# Patient Record
Sex: Male | Born: 1940
Health system: Southern US, Community
[De-identification: ages and names within clinical notes are randomized; demographics above are authoritative.]

## PROBLEM LIST (undated history)

## (undated) DIAGNOSIS — N400 Enlarged prostate without lower urinary tract symptoms: Secondary | ICD-10-CM

## (undated) DIAGNOSIS — N138 Other obstructive and reflux uropathy: Secondary | ICD-10-CM

## (undated) DIAGNOSIS — M1990 Unspecified osteoarthritis, unspecified site: Secondary | ICD-10-CM

## (undated) DIAGNOSIS — N401 Enlarged prostate with lower urinary tract symptoms: Secondary | ICD-10-CM

## (undated) DIAGNOSIS — D696 Thrombocytopenia, unspecified: Secondary | ICD-10-CM

## (undated) DIAGNOSIS — D649 Anemia, unspecified: Secondary | ICD-10-CM

## (undated) DIAGNOSIS — E785 Hyperlipidemia, unspecified: Secondary | ICD-10-CM

## (undated) DIAGNOSIS — I639 Cerebral infarction, unspecified: Secondary | ICD-10-CM

## (undated) DIAGNOSIS — R351 Nocturia: Secondary | ICD-10-CM

## (undated) DIAGNOSIS — N189 Chronic kidney disease, unspecified: Secondary | ICD-10-CM

## (undated) DIAGNOSIS — N485 Ulcer of penis: Secondary | ICD-10-CM

## (undated) DIAGNOSIS — C4490 Unspecified malignant neoplasm of skin, unspecified: Secondary | ICD-10-CM

## (undated) DIAGNOSIS — I509 Heart failure, unspecified: Secondary | ICD-10-CM

## (undated) DIAGNOSIS — K219 Gastro-esophageal reflux disease without esophagitis: Secondary | ICD-10-CM

## (undated) DIAGNOSIS — IMO0002 Reserved for concepts with insufficient information to code with codable children: Secondary | ICD-10-CM

## (undated) DIAGNOSIS — D126 Benign neoplasm of colon, unspecified: Secondary | ICD-10-CM

## (undated) HISTORY — DX: Benign prostatic hyperplasia without lower urinary tract symptoms: N40.0

## (undated) HISTORY — DX: Nocturia: R35.1

## (undated) HISTORY — DX: Reserved for concepts with insufficient information to code with codable children: IMO0002

## (undated) HISTORY — DX: Gastro-esophageal reflux disease without esophagitis: K21.9

## (undated) HISTORY — DX: Anemia, unspecified: D64.9

## (undated) HISTORY — DX: Cerebral infarction, unspecified: I63.9

## (undated) HISTORY — DX: Benign prostatic hyperplasia with lower urinary tract symptoms: N40.1

## (undated) HISTORY — DX: Unspecified malignant neoplasm of skin, unspecified: C44.90

## (undated) HISTORY — PX: HEMORRHOID SURGERY: SHX153

## (undated) HISTORY — DX: Heart failure, unspecified: I50.9

## (undated) HISTORY — DX: Thrombocytopenia, unspecified: D69.6

## (undated) HISTORY — DX: Benign neoplasm of colon, unspecified: D12.6

## (undated) HISTORY — DX: Other obstructive and reflux uropathy: N13.8

## (undated) HISTORY — DX: Hyperlipidemia, unspecified: E78.5

## (undated) HISTORY — DX: Chronic kidney disease, unspecified: N18.9

## (undated) HISTORY — PX: EYE SURGERY: SHX253

## (undated) HISTORY — DX: Ulcer of penis: N48.5

---

## 2007-12-30 ENCOUNTER — Ambulatory Visit: Payer: Self-pay | Admitting: Gastroenterology

## 2009-02-08 ENCOUNTER — Ambulatory Visit: Payer: Self-pay | Admitting: Unknown Physician Specialty

## 2013-02-25 ENCOUNTER — Ambulatory Visit: Payer: Self-pay | Admitting: Urology

## 2013-02-25 LAB — CBC WITH DIFFERENTIAL/PLATELET
Eosinophil %: 1.6 %
HCT: 43.6 % (ref 40.0–52.0)
HGB: 14.7 g/dL (ref 13.0–18.0)
Lymphocyte #: 0.7 10*3/uL — ABNORMAL LOW (ref 1.0–3.6)
Lymphocyte %: 14.2 %
MCH: 32.6 pg (ref 26.0–34.0)
MCHC: 33.8 g/dL (ref 32.0–36.0)
MCV: 97 fL (ref 80–100)
Monocyte %: 10.5 %
Neutrophil #: 3.8 10*3/uL (ref 1.4–6.5)
Neutrophil %: 73.4 %
Platelet: 116 10*3/uL — ABNORMAL LOW (ref 150–440)
RDW: 12.5 % (ref 11.5–14.5)
WBC: 5.2 10*3/uL (ref 3.8–10.6)

## 2013-03-09 ENCOUNTER — Ambulatory Visit: Payer: Self-pay | Admitting: Urology

## 2013-03-09 LAB — ELECTROLYTE PANEL
Anion Gap: 6 — ABNORMAL LOW (ref 7–16)
Chloride: 107 mmol/L (ref 98–107)
Co2: 28 mmol/L (ref 21–32)
Potassium: 4.2 mmol/L (ref 3.5–5.1)

## 2013-03-10 LAB — CBC WITH DIFFERENTIAL/PLATELET
Basophil #: 0 10*3/uL (ref 0.0–0.1)
Eosinophil #: 0.1 10*3/uL (ref 0.0–0.7)
Eosinophil %: 1.9 %
HCT: 39.5 % — ABNORMAL LOW (ref 40.0–52.0)
HGB: 13.3 g/dL (ref 13.0–18.0)
Lymphocyte #: 0.5 10*3/uL — ABNORMAL LOW (ref 1.0–3.6)
MCHC: 33.7 g/dL (ref 32.0–36.0)
MCV: 97 fL (ref 80–100)
Monocyte #: 0.6 x10 3/mm (ref 0.2–1.0)
Monocyte %: 11.1 %
Neutrophil #: 4.3 10*3/uL (ref 1.4–6.5)
RBC: 4.1 10*6/uL — ABNORMAL LOW (ref 4.40–5.90)

## 2013-03-10 LAB — ELECTROLYTE PANEL
Anion Gap: 2 — ABNORMAL LOW (ref 7–16)
Chloride: 108 mmol/L — ABNORMAL HIGH (ref 98–107)
Co2: 31 mmol/L (ref 21–32)
Potassium: 4.1 mmol/L (ref 3.5–5.1)
Sodium: 141 mmol/L (ref 136–145)

## 2013-03-11 LAB — PATHOLOGY REPORT

## 2014-04-05 ENCOUNTER — Ambulatory Visit: Payer: Self-pay | Admitting: Unknown Physician Specialty

## 2014-04-06 LAB — PATHOLOGY REPORT

## 2015-03-18 NOTE — Consult Note (Signed)
PATIENT NAME:  Gabriel Martin, Gabriel Martin MR#:  [id number] DATE OF BIRTH:  01-07-1941  DATE OF CONSULTATION:  03/10/2013  REFERRING PHYSICIAN:   CONSULTING PHYSICIAN:  Janice Coffin. Elnoria Howard, DO  SUMMARY: First postop day. The patient is on observation status. He has no complaints this morning. He has had a restful night. He is hungry this morning; we are going to start him on his regular diet today. He has a light pink urine and irrigation flow in his tubing. There is no active bleeding and the drip is very, very slow. We are going to turn the drip off today for the constant bladder irrigation and see if he clears up. If he has no bleeding, we will remove his Foley and try to get him home today. If it is all red, will send him home with the catheter as long as he has no clots.   Hemoglobin is 13.3 today, which is very stable post TURP. Hematocrit is normal. Electrolytes are normal.   PHYSICAL EXAMINATION: Abdomen is soft, no distress. Catheter is working well. Penis is in good condition with no problem. There is no paraphimosis.   The patient will be maintained on his irrigation. I have explained to him what we did in the procedure. Right now, his condition continues to be satisfactory. I will see how he is at noon. If he is doing well, will send him on his way.    ____________________________ Janice Coffin. Elnoria Howard, DO rdh:es D: 03/10/2013 07:53:04 ET T: 03/10/2013 08:09:21 ET JOB#: [id number]  cc: Janice Coffin. Elnoria Howard, DO, <Dictator>   RICHARD D HART DO ELECTRONICALLY SIGNED 03/12/2013 15:16

## 2015-03-18 NOTE — Op Note (Signed)
PATIENT NAME:  KVION, SHAPLEY MR#:  [id number] DATE OF BIRTH:  Feb 27, 1941  DATE OF PROCEDURE:  03/09/2013  PREOPERATIVE DIAGNOSIS: Vesicle outlet obstruction secondary to benign prostatic hypertrophy.   POSTOPERATIVE DIAGNOSIS: Vesicle outlet obstruction secondary to benign prostatic hypertrophy, mostly middle lobe.   PROCEDURE: Transurethral resection of the prostate.   COMPLICATIONS: None.   ESTIMATED BLOOD LOSS: 20 mL.   ANESTHESIA: LMA general.   PROCEDURE AND FINDINGS ARE AS FOLLOWS: With the patient sterilely prepped and draped and in the supine lithotomy position for ease of approach to the external genitalia, I began the procedure. I used a saline Olympus resectoscope for the procedure. That allows me to put the resectoscope in under direct vision. I removed the obturator and placed the working elements within the 24-French sheath. Constant irrigation and flow was utilized. I am able to see the patient has a massive middle lobe. It is ball-valving into the bladder. It is difficult even to see the ureters, but I only resected the large prostatic middle lobe mass. There are relatively no lateral lobes. He had almost a complete obstruction of his bladder every time he voided. I resected out this middle lobe keeping the verumontanum at the very far point of resection. I do not even go near the vero peritoneum. So, the middle lobe was resected out. There is no lateral lobe hypertrophy. Bleeding is controlled with the electrocautery. Normal saline is used for irrigation. At the end of the procedure, I attempt to use a 2-way Foley, but he has a little oozing so I have to put a 3-way in with irrigation and keep him in observation overnight to control any oozing from his fossa. It is a good-sized fossa and I resected deeply into it but got into no venous sinus bleeding at all. So, he has an estimated blood loss of 20 mL. No real complications. He was sent to recovery in satisfactory condition with  a 3-way Foley draining almost clear urine or light pink. Can still see through it.   ____________________________ Janice Coffin. Elnoria Howard, DO rdh:gb D: 03/09/2013 16:39:01 ET T: 03/10/2013 04:38:08 ET JOB#: [id number]  cc: Janice Coffin. Elnoria Howard, DO, <Dictator> RICHARD D HART DO ELECTRONICALLY SIGNED 03/12/2013 15:16

## 2015-03-18 NOTE — Discharge Summary (Signed)
PATIENT NAME:  Gabriel Martin, Gabriel Martin MR#:  [id number] DATE OF BIRTH:  [birth year]  DATE OF ADMISSION:  03/09/2013  DATE OF DISCHARGE:  03/10/2013  DISCHARGE DIAGNOSES:  Vesical outlet obstruction secondary to benign prostatic hypertrophy.   CONDITION ON DISCHARGE:  Condition is satisfactory.   DISPOSITION:  Discharged to home.  SPECIAL ORDERS:  The patient will be discharged a Foley catheter and Bactrim DS for antibiotic x 5 days. He will he will follow-up in 2 days in my office on Thursday at which time I will fill his bladder through his catheter and remove his catheter and see if he can void. He is sent home with Foley catheter and bedside bag. He is given instructions to care for this. His condition  today is that he light pink urine. Hemoglobin is stable. He has no clots after irrigation turned off. He is ambulatory and eating well. No nausea or vomiting. No leg pain, no shortness of breath. He is then discharged in satisfactory condition. Await his path report.  His follow-up visit is in 2 weeks and then one month after we remove the catheter.  His condition is satisfactory.     ____________________________ Janice Coffin. Elnoria Howard, [place] rdh:ct D: 03/10/2013 12:17:19 ET T: 03/10/2013 12:22:51 ET JOB#: [id number]  cc:   Delfino Lovett D Ragan Reale DO ELECTRONICALLY SIGNED 03/12/2013 15:16

## 2015-05-12 ENCOUNTER — Ambulatory Visit (INDEPENDENT_AMBULATORY_CARE_PROVIDER_SITE_OTHER): Payer: Medicare Other | Admitting: Urology

## 2015-05-12 ENCOUNTER — Encounter: Payer: Self-pay | Admitting: *Deleted

## 2015-05-12 VITALS — BP 137/85 | HR 67 | Ht 66.0 in | Wt 210.2 lb

## 2015-05-12 DIAGNOSIS — N485 Ulcer of penis: Secondary | ICD-10-CM | POA: Insufficient documentation

## 2015-05-12 DIAGNOSIS — N138 Other obstructive and reflux uropathy: Secondary | ICD-10-CM

## 2015-05-12 DIAGNOSIS — IMO0002 Reserved for concepts with insufficient information to code with codable children: Secondary | ICD-10-CM

## 2015-05-12 DIAGNOSIS — N401 Enlarged prostate with lower urinary tract symptoms: Secondary | ICD-10-CM | POA: Diagnosis not present

## 2015-05-12 DIAGNOSIS — E785 Hyperlipidemia, unspecified: Secondary | ICD-10-CM | POA: Insufficient documentation

## 2015-05-12 DIAGNOSIS — N189 Chronic kidney disease, unspecified: Secondary | ICD-10-CM | POA: Insufficient documentation

## 2015-05-12 DIAGNOSIS — K219 Gastro-esophageal reflux disease without esophagitis: Secondary | ICD-10-CM | POA: Insufficient documentation

## 2015-05-12 DIAGNOSIS — D696 Thrombocytopenia, unspecified: Secondary | ICD-10-CM | POA: Insufficient documentation

## 2015-05-12 HISTORY — DX: Other obstructive and reflux uropathy: N13.8

## 2015-05-12 HISTORY — DX: Ulcer of penis: N48.5

## 2015-05-12 HISTORY — DX: Hyperlipidemia, unspecified: E78.5

## 2015-05-12 HISTORY — DX: Reserved for concepts with insufficient information to code with codable children: IMO0002

## 2015-05-12 HISTORY — DX: Gastro-esophageal reflux disease without esophagitis: K21.9

## 2015-05-12 HISTORY — DX: Chronic kidney disease, unspecified: N18.9

## 2015-05-12 LAB — BLADDER SCAN AMB NON-IMAGING: SCAN RESULT: 41

## 2015-05-12 MED ORDER — NYSTATIN 100000 UNIT/GM EX CREA: 1.0000 "application " | TOPICAL_CREAM | Freq: Two times a day (BID) | CUTANEOUS | Status: DC

## 2015-05-12 NOTE — Progress Notes (Signed)
05/12/2015 10:01 AM   Gabriel Martin 25-Oct-1941 [id number]  Referring provider: No referring provider defined for this encounter.  Chief Complaint  Patient presents with  . Benign Prostatic Hypertrophy    one year follow up    HPI: Gabriel Martin is a 74 y/o white male with BPH with LUTS who is s/p TURP on 03/09/2013 with Dr. Elnoria Howard for BOO.  He has been pleased with his voiding since the TURP.  His IPSS score today is 7/2 (mild).  His PVR is 41 mL.  His current lower urinary tract symptoms are feelings of incomplete emptying, urgency, weak stream and nocturia x3.  His is mostly satisfied with his urinary symptoms.  His PSA one year ago was 1.2 ng/mL.    He has not had any recent fevers, chills, nausea, vomiting, suprapubic pain, UTI's or gross hematuria.    On today's exam, there was an incidental finding of a penile ulcer.  The patient did not know how long the lesion had been there.  He has not had any burning or stinging of the ulcer.  He has not had any dysuria or penile discharge.        IPSS      05/12/15 0800       International Prostate Symptom Score   How often have you had the sensation of not emptying your bladder? Less than half the time     How often have you had to urinate less than every two hours? Not at All     How often have you found you stopped and started again several times when you urinated? Not at All     How often have you found it difficult to postpone urination? Less than 1 in 5 times     How often have you had a weak urinary stream? Less than 1 in 5 times     How often have you had to strain to start urination? Not at All     How many times did you typically get up at night to urinate? 3 Times     Total IPSS Score 7     Quality of Life due to urinary symptoms   If you were to spend the rest of your life with your urinary condition just the way it is now how would you feel about that? Mostly Satisfied         PMH: Past Medical History    Diagnosis Date  . Skin cancer   . Benign neoplasm of large bowel   . Anemia   . Hyperlipidemia   . Thrombocytopenia   . GERD (gastroesophageal reflux disease)   . Benign enlargement of prostate   . BPH (benign prostatic hyperplasia)   . Nocturia associated with benign prostatic hypertrophy     Surgical History: Past Surgical History  Procedure Laterality Date  . Hemorrhoid surgery      Home Medications:    Medication List       This list is accurate as of: 05/12/15 10:01 AM.  Always use your most recent med list.               lovastatin 20 MG tablet  Commonly known as:  MEVACOR  Take 20 mg by mouth at bedtime.     nystatin cream  Commonly known as:  MYCOSTATIN  Apply 1 application topically 2 (two) times daily.     ranitidine 75 MG tablet  Commonly known as:  ZANTAC  Take 75 mg by  mouth 2 (two) times daily.        Allergies: No Known Allergies  Family History: Family History  Problem Relation Age of Onset  . Heart disease Brother     Social History:  reports that he has quit smoking. He does not have any smokeless tobacco history on file. He reports that he drinks about 6.0 oz of alcohol per week. He reports that he does not use illicit drugs.  ROS: Urological Symptom Review  Patient is experiencing the following symptoms: Hard to postpone urination Get up at night to urinate Weak stream   Review of Systems  Gastrointestinal (upper)  : Negative for upper GI symptoms  Gastrointestinal (lower) : Negative for lower GI symptoms  Constitutional : Negative for symptoms  Skin: Negative for skin symptoms  Eyes: Negative for eye symptoms  Ear/Nose/Throat : Negative for Ear/Nose/Throat symptoms  Hematologic/Lymphatic: Negative for Hematologic/Lymphatic symptoms  Cardiovascular : Negative for cardiovascular symptoms  Respiratory : Negative for respiratory symptoms  Endocrine: Negative for endocrine  symptoms  Musculoskeletal: Negative for musculoskeletal symptoms  Neurological: Negative for neurological symptoms  Psychologic: Negative for psychiatric symptoms   Physical Exam: BP 137/85 mmHg  Pulse 67  Ht 5\' 6"  (1.676 m)  Wt 210 lb 3.2 oz (95.346 kg)  BMI 33.94 kg/m2  GU: Patient with uncircumcised phallus. Foreskin easily retracted  Urethral meatus is patent.  No penile discharge. Ulcerative lesion in the foreskin and head of the glands (balanitis vs penile carcinoma) on the left dorsal side.  The ulcers are present where the foreskin and the glands meet.  There is a clearing between the ulcers.  A yeasty discharge in around the coronal surface.   Scrotum without lesions, cysts, rashes and/or edema.  Testicles are located scrotally bilaterally. No masses are appreciated in the testicles. Left and right epididymis are normal. Rectal: Patient with  normal sphincter tone. Perineum without scarring or rashes. No rectal masses are appreciated. Prostate is approximately 55 grams, no nodules are appreciated. Seminal vesicles are normal.   Laboratory Data:  Lab Results  Component Value Date   WBC 5.6 03/10/2013   HGB 13.3 03/10/2013   HCT 39.5* 03/10/2013   MCV 97 03/10/2013   PLT 70* 03/10/2013    No results found for: CREATININE  No results found for: PSA  No results found for: TESTOSTERONE  No results found for: HGBA1C  Urinalysis No results found for: COLORURINE, APPEARANCEUR, [place], [place], GLUCOSEU, [place], [place], [place], NITRITE, LEUKOCYTESUR  Pertinent Imaging: Results for TAICHI, REPKA (MRN [id number]) as of 05/12/2015 09:09  Ref. Range 05/12/2015 08:41  Scan Result Unknown 41    Assessment & Plan:    1. BPH (benign prostatic hyperplasia) with LUTS-  Patient has been pleased with his urination since his TURP in 2014.  His IPPS is 7/2 and his PVR is 41 mL. Last PSA was 1.2 ng/mL on 05/01/2014. His symptoms remain mild,  therefore he will return on an annual basis. At his annual visit we'll obtain IPSS, DRE and PSA.  - PSA - BLADDER SCAN AMB NON-IMAGING  2. Penile Ulcer- Patient had an incidental finding of a penile ulcer. Patient is unaware of how long the ulcer had been present. It is not painful to the patient. There was also a collection of a yeasty discharge around the coronal surface of the penis when the foreskin was  retracted.  I have prescribed nystatin cream and instructed the patient pull back the foreskin and clean the head of  the penis with soapy water and dry completely before applying the nystatin cream. He is to do this twice a day for 2 weeks. I would have liked him to return in 2 weeks, so that I can examine the area myself, but the patient did not want to follow-up unless the ulcer did not respond to the medication. I explained to the patient that he needed to be vigilant about this area, if it did not improve, it is very important  he return for an appointment with [place]. At that appointment, we will need to discuss the biopsy of the lesion to rule out any penile cancer. Patient understands this and will make an appointment with [place] if the ulcer does not clear.   No Follow-up on file.  Zara Council, [facility] [address] [place], [place] [address] [phone]

## 2015-05-13 ENCOUNTER — Telehealth: Payer: Self-pay

## 2015-05-13 LAB — PSA: PROSTATE SPECIFIC AG, SERUM: 1.2 ng/mL (ref 0.0–4.0)

## 2015-05-13 NOTE — Telephone Encounter (Signed)
LMOM

## 2015-05-13 NOTE — Telephone Encounter (Signed)
-----   Message from Nori Riis, PA-C sent at 05/13/2015  8:42 AM EDT ----- PSA is stable.  We will see him in one year.  Sooner, if the ulcer on his penis does not clear.

## 2015-05-16 ENCOUNTER — Telehealth: Payer: Self-pay

## 2015-05-16 NOTE — Telephone Encounter (Signed)
LMOM in reference to PSA.

## 2015-05-16 NOTE — Telephone Encounter (Signed)
-----   Message from Nori Riis, PA-C sent at 05/13/2015  8:42 AM EDT ----- PSA is stable.  We will see him in one year.  Sooner, if the ulcer on his penis does not clear.

## 2015-12-08 DIAGNOSIS — D696 Thrombocytopenia, unspecified: Secondary | ICD-10-CM | POA: Diagnosis not present

## 2015-12-08 DIAGNOSIS — K219 Gastro-esophageal reflux disease without esophagitis: Secondary | ICD-10-CM | POA: Diagnosis not present

## 2015-12-08 DIAGNOSIS — E7800 Pure hypercholesterolemia, unspecified: Secondary | ICD-10-CM | POA: Diagnosis not present

## 2015-12-22 DIAGNOSIS — D696 Thrombocytopenia, unspecified: Secondary | ICD-10-CM | POA: Diagnosis not present

## 2015-12-22 DIAGNOSIS — K219 Gastro-esophageal reflux disease without esophagitis: Secondary | ICD-10-CM | POA: Diagnosis not present

## 2015-12-22 DIAGNOSIS — E7800 Pure hypercholesterolemia, unspecified: Secondary | ICD-10-CM | POA: Diagnosis not present

## 2015-12-22 DIAGNOSIS — N182 Chronic kidney disease, stage 2 (mild): Secondary | ICD-10-CM | POA: Diagnosis not present

## 2016-01-10 DIAGNOSIS — H2513 Age-related nuclear cataract, bilateral: Secondary | ICD-10-CM | POA: Diagnosis not present

## 2016-01-31 DIAGNOSIS — H2513 Age-related nuclear cataract, bilateral: Secondary | ICD-10-CM | POA: Diagnosis not present

## 2016-02-23 DIAGNOSIS — H2513 Age-related nuclear cataract, bilateral: Secondary | ICD-10-CM | POA: Diagnosis not present

## 2016-02-28 NOTE — Discharge Instructions (Signed)

## 2016-02-29 ENCOUNTER — Encounter
Admission: RE | Disposition: A | Discharge status: Home / Self Care | Payer: Self-pay | Source: Ambulatory Visit | Attending: Ophthalmology

## 2016-02-29 ENCOUNTER — Ambulatory Visit: Payer: PPO | Admitting: Anesthesiology

## 2016-02-29 ENCOUNTER — Ambulatory Visit
Admission: RE | Admit: 2016-02-29 | Discharge: 2016-02-29 | Disposition: A | Discharge status: Home / Self Care | Payer: PPO | Source: Ambulatory Visit | Attending: Ophthalmology | Admitting: Ophthalmology

## 2016-02-29 DIAGNOSIS — K219 Gastro-esophageal reflux disease without esophagitis: Secondary | ICD-10-CM | POA: Insufficient documentation

## 2016-02-29 DIAGNOSIS — H2512 Age-related nuclear cataract, left eye: Secondary | ICD-10-CM | POA: Insufficient documentation

## 2016-02-29 DIAGNOSIS — Z87891 Personal history of nicotine dependence: Secondary | ICD-10-CM | POA: Diagnosis not present

## 2016-02-29 DIAGNOSIS — E7800 Pure hypercholesterolemia, unspecified: Secondary | ICD-10-CM | POA: Insufficient documentation

## 2016-02-29 DIAGNOSIS — D696 Thrombocytopenia, unspecified: Secondary | ICD-10-CM | POA: Diagnosis not present

## 2016-02-29 DIAGNOSIS — H2513 Age-related nuclear cataract, bilateral: Secondary | ICD-10-CM | POA: Diagnosis not present

## 2016-02-29 HISTORY — DX: Unspecified osteoarthritis, unspecified site: M19.90

## 2016-02-29 HISTORY — PX: CATARACT EXTRACTION W/PHACO: SHX586

## 2016-02-29 SURGERY — PHACOEMULSIFICATION, CATARACT, WITH IOL INSERTION
Anesthesia: Monitor Anesthesia Care | Site: Eye | Laterality: Left | Wound class: Clean

## 2016-02-29 MED ORDER — OXYCODONE HCL 5 MG/5ML PO SOLN: 5.0000 mg | Freq: Once | ORAL | Status: DC | PRN

## 2016-02-29 MED ORDER — LIDOCAINE HCL (PF) 4 % IJ SOLN
INTRAMUSCULAR | Status: DC | PRN
  Administered 2016-02-29: 1 mL via OPHTHALMIC

## 2016-02-29 MED ORDER — FENTANYL CITRATE (PF) 100 MCG/2ML IJ SOLN
INTRAMUSCULAR | Status: DC | PRN
  Administered 2016-02-29: 50 ug via INTRAVENOUS

## 2016-02-29 MED ORDER — BRIMONIDINE TARTRATE 0.2 % OP SOLN
OPHTHALMIC | Status: DC | PRN
  Administered 2016-02-29: 1 [drp] via OPHTHALMIC

## 2016-02-29 MED ORDER — MIDAZOLAM HCL 2 MG/2ML IJ SOLN
INTRAMUSCULAR | Status: DC | PRN
  Administered 2016-02-29 (×2): 1 mg via INTRAVENOUS

## 2016-02-29 MED ORDER — OXYCODONE HCL 5 MG PO TABS: 5.0000 mg | ORAL_TABLET | Freq: Once | ORAL | Status: DC | PRN

## 2016-02-29 MED ORDER — POVIDONE-IODINE 5 % OP SOLN
1.0000 "application " | OPHTHALMIC | Status: DC | PRN
  Administered 2016-02-29: 1 via OPHTHALMIC

## 2016-02-29 MED ORDER — NA HYALUR & NA CHOND-NA HYALUR 0.4-0.35 ML IO KIT
PACK | INTRAOCULAR | Status: DC | PRN
  Administered 2016-02-29: 1 mL via INTRAOCULAR

## 2016-02-29 MED ORDER — ARMC OPHTHALMIC DILATING GEL
1.0000 "application " | OPHTHALMIC | Status: DC | PRN
  Administered 2016-02-29 (×2): 1 via OPHTHALMIC

## 2016-02-29 MED ORDER — EPINEPHRINE HCL 1 MG/ML IJ SOLN
INTRAOCULAR | Status: DC | PRN
  Administered 2016-02-29: 47 mL via OPHTHALMIC

## 2016-02-29 MED ORDER — TETRACAINE HCL 0.5 % OP SOLN
1.0000 [drp] | OPHTHALMIC | Status: DC | PRN
  Administered 2016-02-29: 1 [drp] via OPHTHALMIC

## 2016-02-29 SURGICAL SUPPLY — 26 items
CANNULA ANT/CHMB 27GA (MISCELLANEOUS) ×3 IMPLANT
CARTRIDGE ABBOTT (MISCELLANEOUS) ×3 IMPLANT
GLOVE SURG LX 7.5 STRW (GLOVE) ×2
GLOVE SURG LX STRL 7.5 STRW (GLOVE) ×1 IMPLANT
GLOVE SURG TRIUMPH 8.0 PF LTX (GLOVE) ×3 IMPLANT
GOWN STRL REUS W/ TWL LRG LVL3 (GOWN DISPOSABLE) ×2 IMPLANT
GOWN STRL REUS W/TWL LRG LVL3 (GOWN DISPOSABLE) ×4
LENS IOL TECNIS ITEC 21.5 (Intraocular Lens) ×3 IMPLANT
MARKER SKIN DUAL TIP RULER LAB (MISCELLANEOUS) ×3 IMPLANT
NDL RETROBULBAR .5 NSTRL (NEEDLE) IMPLANT
NEEDLE FILTER BLUNT 18X 1/2SAF (NEEDLE) ×2
NEEDLE FILTER BLUNT 18X1 1/2 (NEEDLE) ×1 IMPLANT
PACK CATARACT BRASINGTON (MISCELLANEOUS) ×3 IMPLANT
PACK EYE AFTER SURG (MISCELLANEOUS) ×3 IMPLANT
PACK OPTHALMIC (MISCELLANEOUS) ×3 IMPLANT
RING MALYGIN 7.0 (MISCELLANEOUS) IMPLANT
SUT ETHILON 10-0 CS-B-6CS-B-6 (SUTURE)
SUT VICRYL  9 0 (SUTURE)
SUT VICRYL 9 0 (SUTURE) IMPLANT
SUTURE EHLN 10-0 CS-B-6CS-B-6 (SUTURE) IMPLANT
SYR 3ML LL SCALE MARK (SYRINGE) ×3 IMPLANT
SYR 5ML LL (SYRINGE) IMPLANT
SYR TB 1ML LUER SLIP (SYRINGE) ×3 IMPLANT
WATER STERILE IRR 250ML POUR (IV SOLUTION) ×3 IMPLANT
WATER STERILE IRR 500ML POUR (IV SOLUTION) IMPLANT
WIPE NON LINTING 3.25X3.25 (MISCELLANEOUS) ×3 IMPLANT

## 2016-02-29 NOTE — Op Note (Signed)
OPERATIVE NOTE  WALLICE FRYMIER [id number] 02/29/2016   PREOPERATIVE DIAGNOSIS:  Nuclear sclerotic cataract left eye. H25.12   POSTOPERATIVE DIAGNOSIS:    Nuclear sclerotic cataract left eye.     PROCEDURE:  Phacoemusification with posterior chamber intraocular lens placement of the left eye   LENS:   Implant Name Type Inv. Item Serial No. Manufacturer Lot No. LRB No. Used  TECHNIS ASPHERIC IOL PRE-LOADED       [facility]   Left 1     PCB00 21.5 D   ULTRASOUND TIME: 18  % of 0 minutes 57 seconds, CDE 10.4  SURGEON:  Wyonia Hough, MD   ANESTHESIA:  Topical with tetracaine drops and 2% Xylocaine jelly, augmented with 1% preservative-free intracameral lidocaine.    COMPLICATIONS:  None.   DESCRIPTION OF PROCEDURE:  The patient was identified in the holding room and transported to the operating room and placed in the supine position under the operating microscope.  The left eye was identified as the operative eye and it was prepped and draped in the usual sterile ophthalmic fashion.   A 1 millimeter clear-corneal paracentesis was made at the 1:30 position.  0.5 ml of preservative-free 1% lidocaine was injected into the anterior chamber.  The anterior chamber was filled with Viscoat viscoelastic.  A 2.4 millimeter keratome was used to make a near-clear corneal incision at the 10:30 position.  .  A curvilinear capsulorrhexis was made with a cystotome and capsulorrhexis forceps.  Balanced salt solution was used to hydrodissect and hydrodelineate the nucleus.   Phacoemulsification was then used in stop and chop fashion to remove the lens nucleus and epinucleus.  The remaining cortex was then removed using the irrigation and aspiration handpiece. Provisc was then placed into the capsular bag to distend it for lens placement.  A lens was then injected into the capsular bag.  The remaining viscoelastic was aspirated.   Wounds were hydrated with balanced salt solution.  The anterior  chamber was inflated to a physiologic pressure with balanced salt solution.  No wound leaks were noted. Cefuroxime 0.1 ml of a 10mg /ml solution was injected into the anterior chamber for a dose of 1 mg of intracameral antibiotic at the completion of the case.   Timolol and Brimonidine drops were applied to the eye.  The patient was taken to the recovery room in stable condition without complications of anesthesia or surgery.  Josslyn Ciolek 02/29/2016, 10:47 AM

## 2016-02-29 NOTE — Transfer of Care (Signed)
Immediate Anesthesia Transfer of Care Note  Patient: Gabriel Martin  Procedure(s) Performed: Procedure(s): CATARACT EXTRACTION PHACO AND INTRAOCULAR LENS PLACEMENT (IOC) left (Left)  Patient Location: PACU  Anesthesia Type: MAC  Level of Consciousness: awake, alert  and patient cooperative  Airway and Oxygen Therapy: Patient Spontanous Breathing and Patient connected to supplemental oxygen  Post-op Assessment: Post-op Vital signs reviewed, Patient's Cardiovascular Status Stable, Respiratory Function Stable, Patent Airway and No signs of Nausea or vomiting  Post-op Vital Signs: Reviewed and stable  Complications: No apparent anesthesia complications

## 2016-02-29 NOTE — Anesthesia Postprocedure Evaluation (Signed)
Anesthesia Post Note  Patient: Gabriel Martin  Procedure(s) Performed: Procedure(s) (LRB): CATARACT EXTRACTION PHACO AND INTRAOCULAR LENS PLACEMENT (IOC) left (Left)  Patient location during evaluation: PACU Anesthesia Type: MAC Level of consciousness: awake and alert Pain management: pain level controlled Vital Signs Assessment: post-procedure vital signs reviewed and stable Respiratory status: spontaneous breathing, nonlabored ventilation, respiratory function stable and patient connected to nasal cannula oxygen Cardiovascular status: stable and blood pressure returned to baseline Anesthetic complications: no    Karmella Bouvier C

## 2016-02-29 NOTE — H&P (Signed)
  The History and Physical notes are on paper, have been signed, and are to be scanned. The patient remains stable and unchanged from the H&P.   Previous H&P reviewed, patient examined, and there are no changes.  Gabriel Martin 02/29/2016 9:59 AM

## 2016-02-29 NOTE — Anesthesia Preprocedure Evaluation (Signed)
Anesthesia Evaluation  Patient identified by MRN, date of birth, ID band Patient awake    Reviewed: Allergy & Precautions, NPO status , Patient's Chart, lab work & pertinent test results  Airway Mallampati: II  TM Distance: >3 FB Neck ROM: Full    Dental no notable dental hx.    Pulmonary neg pulmonary ROS, former smoker,    Pulmonary exam normal breath sounds clear to auscultation       Cardiovascular negative cardio ROS Normal cardiovascular exam Rhythm:Regular Rate:Normal     Neuro/Psych negative neurological ROS  negative psych ROS   GI/Hepatic negative GI ROS, Neg liver ROS, GERD  Controlled,  Endo/Other  negative endocrine ROS  Renal/GU Renal disease  negative genitourinary   Musculoskeletal negative musculoskeletal ROS (+)   Abdominal   Peds negative pediatric ROS (+)  Hematology thrombocytopenia   Anesthesia Other Findings   Reproductive/Obstetrics negative OB ROS                             Anesthesia Physical Anesthesia Plan  ASA: II  Anesthesia Plan: MAC   Post-op Pain Management:    Induction: Intravenous  Airway Management Planned:   Additional Equipment:   Intra-op Plan:   Post-operative Plan: Extubation in OR  Informed Consent: I have reviewed the patients History and Physical, chart, labs and discussed the procedure including the risks, benefits and alternatives for the proposed anesthesia with the patient or authorized representative who has indicated his/her understanding and acceptance.   Dental advisory given  Plan Discussed with: CRNA  Anesthesia Plan Comments:         Anesthesia Quick Evaluation

## 2016-03-01 ENCOUNTER — Encounter: Payer: Self-pay | Admitting: Ophthalmology

## 2016-04-20 ENCOUNTER — Encounter: Payer: Self-pay | Admitting: *Deleted

## 2016-04-20 DIAGNOSIS — H2511 Age-related nuclear cataract, right eye: Secondary | ICD-10-CM | POA: Diagnosis not present

## 2016-04-24 NOTE — Discharge Instructions (Signed)

## 2016-04-25 ENCOUNTER — Ambulatory Visit: Payer: PPO | Admitting: Anesthesiology

## 2016-04-25 ENCOUNTER — Ambulatory Visit
Admission: RE | Admit: 2016-04-25 | Discharge: 2016-04-25 | Disposition: A | Discharge status: Home / Self Care | Payer: PPO | Source: Ambulatory Visit | Attending: Ophthalmology | Admitting: Ophthalmology

## 2016-04-25 ENCOUNTER — Encounter
Admission: RE | Disposition: A | Discharge status: Home / Self Care | Payer: Self-pay | Source: Ambulatory Visit | Attending: Ophthalmology

## 2016-04-25 ENCOUNTER — Ambulatory Visit: Admit: 2016-04-25 | Payer: Self-pay | Admitting: Ophthalmology

## 2016-04-25 DIAGNOSIS — Z87891 Personal history of nicotine dependence: Secondary | ICD-10-CM | POA: Diagnosis not present

## 2016-04-25 DIAGNOSIS — Z79899 Other long term (current) drug therapy: Secondary | ICD-10-CM | POA: Diagnosis not present

## 2016-04-25 DIAGNOSIS — Z9841 Cataract extraction status, right eye: Secondary | ICD-10-CM | POA: Diagnosis not present

## 2016-04-25 DIAGNOSIS — M1990 Unspecified osteoarthritis, unspecified site: Secondary | ICD-10-CM | POA: Insufficient documentation

## 2016-04-25 DIAGNOSIS — E7800 Pure hypercholesterolemia, unspecified: Secondary | ICD-10-CM | POA: Insufficient documentation

## 2016-04-25 DIAGNOSIS — K219 Gastro-esophageal reflux disease without esophagitis: Secondary | ICD-10-CM | POA: Insufficient documentation

## 2016-04-25 DIAGNOSIS — H269 Unspecified cataract: Secondary | ICD-10-CM | POA: Diagnosis not present

## 2016-04-25 DIAGNOSIS — H2511 Age-related nuclear cataract, right eye: Secondary | ICD-10-CM | POA: Insufficient documentation

## 2016-04-25 DIAGNOSIS — Z85828 Personal history of other malignant neoplasm of skin: Secondary | ICD-10-CM | POA: Diagnosis not present

## 2016-04-25 DIAGNOSIS — Z9889 Other specified postprocedural states: Secondary | ICD-10-CM | POA: Diagnosis not present

## 2016-04-25 HISTORY — PX: CATARACT EXTRACTION W/PHACO: SHX586

## 2016-04-25 SURGERY — PHACOEMULSIFICATION, CATARACT, WITH IOL INSERTION
Anesthesia: Monitor Anesthesia Care | Laterality: Right | Wound class: Clean

## 2016-04-25 SURGERY — PHACOEMULSIFICATION, CATARACT, WITH IOL INSERTION
Anesthesia: Choice | Laterality: Right

## 2016-04-25 MED ORDER — ARMC OPHTHALMIC DILATING GEL
1.0000 "application " | OPHTHALMIC | Status: DC | PRN
  Administered 2016-04-25 (×2): 1 via OPHTHALMIC

## 2016-04-25 MED ORDER — CEFUROXIME OPHTHALMIC INJECTION 1 MG/0.1 ML
INJECTION | OPHTHALMIC | Status: DC | PRN
  Administered 2016-04-25: 0.1 mL via OPHTHALMIC

## 2016-04-25 MED ORDER — POVIDONE-IODINE 5 % OP SOLN
1.0000 "application " | OPHTHALMIC | Status: DC | PRN
  Administered 2016-04-25: 1 via OPHTHALMIC

## 2016-04-25 MED ORDER — MIDAZOLAM HCL 2 MG/2ML IJ SOLN
INTRAMUSCULAR | Status: DC | PRN
  Administered 2016-04-25: 1 mg via INTRAVENOUS

## 2016-04-25 MED ORDER — NA HYALUR & NA CHOND-NA HYALUR 0.4-0.35 ML IO KIT
PACK | INTRAOCULAR | Status: DC | PRN
  Administered 2016-04-25: 1 mL via INTRAOCULAR

## 2016-04-25 MED ORDER — TIMOLOL MALEATE 0.5 % OP SOLN
OPHTHALMIC | Status: DC | PRN
  Administered 2016-04-25: 1 [drp] via OPHTHALMIC

## 2016-04-25 MED ORDER — LACTATED RINGERS IV SOLN: INTRAVENOUS | Status: DC

## 2016-04-25 MED ORDER — FENTANYL CITRATE (PF) 100 MCG/2ML IJ SOLN
INTRAMUSCULAR | Status: DC | PRN
Start: 2016-04-25 — End: 2016-04-25
  Administered 2016-04-25: 50 ug via INTRAVENOUS

## 2016-04-25 MED ORDER — TETRACAINE HCL 0.5 % OP SOLN
1.0000 [drp] | OPHTHALMIC | Status: DC | PRN
  Administered 2016-04-25: 1 [drp] via OPHTHALMIC

## 2016-04-25 MED ORDER — BRIMONIDINE TARTRATE 0.2 % OP SOLN
OPHTHALMIC | Status: DC | PRN
  Administered 2016-04-25: 1 [drp] via OPHTHALMIC

## 2016-04-25 MED ORDER — EPINEPHRINE HCL 1 MG/ML IJ SOLN
INTRAOCULAR | Status: DC | PRN
  Administered 2016-04-25: 67 mL via OPHTHALMIC

## 2016-04-25 MED ORDER — LIDOCAINE HCL (PF) 4 % IJ SOLN
INTRAOCULAR | Status: DC | PRN
  Administered 2016-04-25: 1 mL via OPHTHALMIC

## 2016-04-25 SURGICAL SUPPLY — 21 items
CANNULA ANT/CHMB 27GA (MISCELLANEOUS) ×3 IMPLANT
CARTRIDGE ABBOTT (MISCELLANEOUS) IMPLANT
GLOVE SURG LX 7.5 STRW (GLOVE) ×2
GLOVE SURG LX STRL 7.5 STRW (GLOVE) ×1 IMPLANT
GLOVE SURG TRIUMPH 8.0 PF LTX (GLOVE) ×3 IMPLANT
GOWN STRL REUS W/ TWL LRG LVL3 (GOWN DISPOSABLE) ×2 IMPLANT
GOWN STRL REUS W/TWL LRG LVL3 (GOWN DISPOSABLE) ×4
LENS IOL TECNIS ITEC 21.5 (Intraocular Lens) ×3 IMPLANT
MARKER SKIN DUAL TIP RULER LAB (MISCELLANEOUS) ×3 IMPLANT
NDL RETROBULBAR .5 NSTRL (NEEDLE) IMPLANT
PACK CATARACT BRASINGTON (MISCELLANEOUS) ×3 IMPLANT
PACK EYE AFTER SURG (MISCELLANEOUS) ×3 IMPLANT
PACK OPTHALMIC (MISCELLANEOUS) ×3 IMPLANT
RING MALYGIN 7.0 (MISCELLANEOUS) IMPLANT
SUT ETHILON 10-0 CS-B-6CS-B-6 (SUTURE)
SUT VICRYL  9 0 (SUTURE)
SUT VICRYL 9 0 (SUTURE) IMPLANT
SUTURE EHLN 10-0 CS-B-6CS-B-6 (SUTURE) IMPLANT
SYR TB 1ML LUER SLIP (SYRINGE) ×3 IMPLANT
WATER STERILE IRR 250ML POUR (IV SOLUTION) ×3 IMPLANT
WIPE NON LINTING 3.25X3.25 (MISCELLANEOUS) ×3 IMPLANT

## 2016-04-25 NOTE — Anesthesia Preprocedure Evaluation (Signed)
Anesthesia Evaluation  Patient identified by MRN, date of birth, ID band Patient awake    Reviewed: Allergy & Precautions, NPO status , Patient's Chart, lab work & pertinent test results  Airway Mallampati: II  TM Distance: >3 FB Neck ROM: Full    Dental no notable dental hx.    Pulmonary neg pulmonary ROS, former smoker,    Pulmonary exam normal breath sounds clear to auscultation       Cardiovascular negative cardio ROS Normal cardiovascular exam Rhythm:Regular Rate:Normal     Neuro/Psych negative neurological ROS  negative psych ROS   GI/Hepatic negative GI ROS, Neg liver ROS, GERD  Controlled,  Endo/Other  negative endocrine ROS  Renal/GU Renal disease  negative genitourinary   Musculoskeletal negative musculoskeletal ROS (+)   Abdominal   Peds negative pediatric ROS (+)  Hematology thrombocytopenia   Anesthesia Other Findings   Reproductive/Obstetrics negative OB ROS                             Anesthesia Physical Anesthesia Plan  ASA: II  Anesthesia Plan: MAC   Post-op Pain Management:    Induction: Intravenous  Airway Management Planned:   Additional Equipment:   Intra-op Plan:   Post-operative Plan: Extubation in OR  Informed Consent: I have reviewed the patients History and Physical, chart, labs and discussed the procedure including the risks, benefits and alternatives for the proposed anesthesia with the patient or authorized representative who has indicated his/her understanding and acceptance.   Dental advisory given  Plan Discussed with: CRNA  Anesthesia Plan Comments:         Anesthesia Quick Evaluation

## 2016-04-25 NOTE — Transfer of Care (Signed)
Immediate Anesthesia Transfer of Care Note  Patient: Gabriel Martin  Procedure(s) Performed: Procedure(s): CATARACT EXTRACTION PHACO AND INTRAOCULAR LENS PLACEMENT (IOC) (Right)  Patient Location: PACU  Anesthesia Type: MAC  Level of Consciousness: awake, alert  and patient cooperative  Airway and Oxygen Therapy: Patient Spontanous Breathing and Patient connected to supplemental oxygen  Post-op Assessment: Post-op Vital signs reviewed, Patient's Cardiovascular Status Stable, Respiratory Function Stable, Patent Airway and No signs of Nausea or vomiting  Post-op Vital Signs: Reviewed and stable  Complications: No apparent anesthesia complications

## 2016-04-25 NOTE — Op Note (Signed)
LOCATION:  [place]   PREOPERATIVE DIAGNOSIS:    Nuclear sclerotic cataract right eye. H25.11   POSTOPERATIVE DIAGNOSIS:  Nuclear sclerotic cataract right eye.     PROCEDURE:  Phacoemusification with posterior chamber intraocular lens placement of the right eye   LENS:   Implant Name Type Inv. Item Serial No. Manufacturer Lot No. LRB No. Used  LENS IOL DIOP 21.5 - [id number] Intraocular Lens LENS IOL DIOP 21.5 [id number]   Right 1        ULTRASOUND TIME: 18 % of 1 minutes, 2 seconds.  CDE 11.1   SURGEON:  Wyonia Hough, MD   ANESTHESIA:  Topical with tetracaine drops and 2% Xylocaine jelly, augmented with 1% preservative-free intracameral lidocaine.    COMPLICATIONS:  None.   DESCRIPTION OF PROCEDURE:  The patient was identified in the holding room and transported to the operating room and placed in the supine position under the operating microscope.  The right eye was identified as the operative eye and it was prepped and draped in the usual sterile ophthalmic fashion.   A 1 millimeter clear-corneal paracentesis was made at the 12:00 position.  0.5 ml of preservative-free 1% lidocaine was injected into the anterior chamber. The anterior chamber was filled with Viscoat viscoelastic.  A 2.4 millimeter keratome was used to make a near-clear corneal incision at the 9:00 position.  A curvilinear capsulorrhexis was made with a cystotome and capsulorrhexis forceps.  Balanced salt solution was used to hydrodissect and hydrodelineate the nucleus.   Phacoemulsification was then used in stop and chop fashion to remove the lens nucleus and epinucleus.  The remaining cortex was then removed using the irrigation and aspiration handpiece. Provisc was then placed into the capsular bag to distend it for lens placement.  A lens was then injected into the capsular bag.  The remaining viscoelastic was aspirated.   Wounds were hydrated with balanced salt solution.  The anterior  chamber was inflated to a physiologic pressure with balanced salt solution.  No wound leaks were noted. Cefuroxime 0.1 ml of a 10mg /ml solution was injected into the anterior chamber for a dose of 1 mg of intracameral antibiotic at the completion of the case.   Timolol and Brimonidine drops were applied to the eye.  The patient was taken to the recovery room in stable condition without complications of anesthesia or surgery.   Siniya Lichty 04/25/2016, 10:08 AM

## 2016-04-25 NOTE — H&P (Signed)
  The History and Physical notes are on paper, have been signed, and are to be scanned. The patient remains stable and unchanged from the H&P.   Previous H&P reviewed, patient examined, and there are no changes.  Salar Molden 04/25/2016 9:39 AM

## 2016-04-25 NOTE — Anesthesia Procedure Notes (Signed)
Procedure Name: MAC Performed by: Ilia Dimaano Pre-anesthesia Checklist: Patient identified, Emergency Drugs available, Suction available, Patient being monitored and Timeout performed Patient Re-evaluated:Patient Re-evaluated prior to inductionOxygen Delivery Method: Nasal cannula       

## 2016-04-25 NOTE — Anesthesia Postprocedure Evaluation (Signed)
Anesthesia Post Note  Patient: Gabriel Martin  Procedure(s) Performed: Procedure(s) (LRB): CATARACT EXTRACTION PHACO AND INTRAOCULAR LENS PLACEMENT (IOC) (Right)  Patient location during evaluation: PACU Anesthesia Type: General Level of consciousness: awake and alert Pain management: pain level controlled Vital Signs Assessment: post-procedure vital signs reviewed and stable Respiratory status: spontaneous breathing, nonlabored ventilation, respiratory function stable and patient connected to nasal cannula oxygen Cardiovascular status: blood pressure returned to baseline and stable Postop Assessment: no signs of nausea or vomiting Anesthetic complications: no    Marshell Levan

## 2016-05-11 ENCOUNTER — Ambulatory Visit (INDEPENDENT_AMBULATORY_CARE_PROVIDER_SITE_OTHER): Payer: PPO | Admitting: Urology

## 2016-05-11 ENCOUNTER — Encounter: Payer: Self-pay | Admitting: Urology

## 2016-05-11 VITALS — BP 116/72 | HR 73 | Ht 66.0 in | Wt 206.0 lb

## 2016-05-11 DIAGNOSIS — N138 Other obstructive and reflux uropathy: Secondary | ICD-10-CM

## 2016-05-11 DIAGNOSIS — Z125 Encounter for screening for malignant neoplasm of prostate: Secondary | ICD-10-CM | POA: Diagnosis not present

## 2016-05-11 DIAGNOSIS — N401 Enlarged prostate with lower urinary tract symptoms: Secondary | ICD-10-CM | POA: Diagnosis not present

## 2016-05-11 DIAGNOSIS — N485 Ulcer of penis: Secondary | ICD-10-CM | POA: Diagnosis not present

## 2016-05-11 NOTE — Progress Notes (Signed)
05/11/2016 8:36 AM   Gabriel Martin Nov 09, 1941 [id number]  Referring provider: [name], MD [address] [facility] [place], [place] [address]  Chief Complaint  Patient presents with  . Follow-up    BPH    HPI: Patient is 75 year old Caucasian male who presents today for a 1 year follow-up for penile ulcer and BPH with LUTS.  Penile ulcer Patient was prescribed nystatin cream and instructed on perineal hygiene. He was to contact the office if the ulcer did not resolve, as it was most likely balanitis caused by yeast.  He states the ulcer did resolve and he had no further issue with it.  BPH WITH LUTS His IPSS score today is 5, which is mild lower urinary tract symptomatology. He is pleased with his quality life due to his urinary symptoms.   He denies any dysuria, hematuria or suprapubic pain.  His has had TURP on 03/09/2013 with Dr. Elnoria Howard for BOO. He has been pleased with his voiding since the TURP. He also denies any recent fevers, chills, nausea or vomiting.  He does not have a family history of PCa.      IPSS      05/11/16 0800       International Prostate Symptom Score   How often have you had the sensation of not emptying your bladder? Less than 1 in 5     How often have you had to urinate less than every two hours? Not at All     How often have you found you stopped and started again several times when you urinated? Not at All     How often have you found it difficult to postpone urination? Not at All     How often have you had a weak urinary stream? Not at All     How often have you had to strain to start urination? Less than 1 in 5 times     How many times did you typically get up at night to urinate? 3 Times     Total IPSS Score 5     Quality of Life due to urinary symptoms   If you were to spend the rest of your life with your urinary condition just the way it is now how would you feel about that? Pleased        Score:  1-7  Mild 8-19 Moderate 20-35 Severe    PMH: Past Medical History  Diagnosis Date  . Skin cancer   . Benign neoplasm of large bowel   . Anemia   . Hyperlipidemia   . Thrombocytopenia ([place])   . GERD (gastroesophageal reflux disease)   . Benign enlargement of prostate   . BPH (benign prostatic hyperplasia)   . Nocturia associated with benign prostatic hypertrophy   . Arthritis     finger thumb  . Acid reflux 05/12/2015  . Penile ulcer 05/12/2015  . Chronic kidney disease 05/12/2015  . BPH with obstruction/lower urinary tract symptoms 05/12/2015  . Benign prostatic hyperplasia with urinary obstruction 05/12/2015  . HLD (hyperlipidemia) 05/12/2015  . Change in blood platelet count 05/12/2015    Surgical History: Past Surgical History  Procedure Laterality Date  . Hemorrhoid surgery    . Cataract extraction w/phaco Left 02/29/2016    Procedure: CATARACT EXTRACTION PHACO AND INTRAOCULAR LENS PLACEMENT ([place]) left;  Surgeon: Leandrew Koyanagi, MD;  Location: [place];  Service: Ophthalmology;  Laterality: Left;  . Cataract extraction w/phaco Right 04/25/2016  Procedure: CATARACT EXTRACTION PHACO AND INTRAOCULAR LENS PLACEMENT ([facility]);  Surgeon: Leandrew Koyanagi, MD;  Location: [place];  Service: Ophthalmology;  Laterality: Right;    Home Medications:    Medication List       This list is accurate as of: 05/11/16  8:36 AM.  Always use your most recent med list.               lovastatin 20 MG tablet  Commonly known as:  MEVACOR  Take 20 mg by mouth at bedtime.     nystatin cream  Commonly known as:  MYCOSTATIN  Apply 1 application topically 2 (two) times daily.     ranitidine 75 MG tablet  Commonly known as:  ZANTAC  Take 150 mg by mouth 2 (two) times daily.     VIGAMOX 0.5 % ophthalmic solution  Generic drug:  moxifloxacin        Allergies: No Known Allergies  Family History: Family History  Problem Relation Age of Onset  . Heart disease  Brother     Social History:  reports that he has quit smoking. He does not have any smokeless tobacco history on file. He reports that he drinks about 6.0 oz of alcohol per week. He reports that he does not use illicit drugs.  ROS: UROLOGY Frequent Urination?: No Hard to postpone urination?: No Burning/pain with urination?: No Get up at night to urinate?: Yes Leakage of urine?: No Urine stream starts and stops?: No Trouble starting stream?: No Do you have to strain to urinate?: No Blood in urine?: No Urinary tract infection?: No Sexually transmitted disease?: No Injury to kidneys or bladder?: No Painful intercourse?: No Weak stream?: No Erection problems?: No Penile pain?: No  Gastrointestinal Nausea?: No Vomiting?: No Indigestion/heartburn?: No Diarrhea?: No Constipation?: No  Constitutional Fever: No Night sweats?: No Weight loss?: No Fatigue?: No  Skin Skin rash/lesions?: No Itching?: No  Eyes Blurred vision?: No Double vision?: No  Ears/Nose/Throat Sore throat?: No Sinus problems?: No  Hematologic/Lymphatic Swollen glands?: No Easy bruising?: No  Cardiovascular Leg swelling?: No Chest pain?: No  Respiratory Cough?: No Shortness of breath?: No  Endocrine Excessive thirst?: No  Musculoskeletal Back pain?: No Joint pain?: No  Neurological Headaches?: No Dizziness?: No  Psychologic Depression?: No Anxiety?: No  Physical Exam: BP 116/72 mmHg  Pulse 73  Ht 5\' 6"  (1.676 m)  Wt 206 lb (93.441 kg)  BMI 33.27 kg/m2  Constitutional: Well nourished. Alert and oriented, No acute distress. HEENT: [place] AT, moist mucus membranes. Trachea midline, no masses. Cardiovascular: No clubbing, cyanosis, or edema. Respiratory: Normal respiratory effort, no increased work of breathing. GI: Abdomen is soft, non tender, non distended, no abdominal masses. Liver and spleen not palpable.  No hernias appreciated.  Stool sample for occult testing is not  indicated.   GU: No CVA tenderness.  No bladder fullness or masses.  Patient with uncircumcised phallus. Foreskin easily retracted  Urethral meatus is patent.  No penile discharge. No penile lesions or rashes. Scrotum without lesions, cysts, rashes and/or edema.  Testicles are located scrotally bilaterally. No masses are appreciated in the testicles. Left and right epididymis are normal. Rectal: Patient with  normal sphincter tone. Anus and perineum without scarring or rashes. No rectal masses are appreciated. Prostate is approximately 55 grams, no nodules are appreciated. Seminal vesicles are normal. Skin: No rashes, bruises or suspicious lesions. Lymph: No cervical or inguinal adenopathy. Neurologic: Grossly intact, no focal deficits, moving all 4 extremities. Psychiatric: Normal mood  and affect.  Laboratory Data: Lab Results  Component Value Date   WBC 5.6 03/10/2013   HGB 13.3 03/10/2013   HCT 39.5* 03/10/2013   MCV 97 03/10/2013   PLT 70* 03/10/2013   PSA history  1.2 ng/mL on 05/12/2015   Assessment & Plan:    1. BPH (benign prostatic hyperplasia) with LUTS- Patient has been pleased with his urination since his TURP in 2014. His IPSS is 5/1.  His symptoms remain mild, therefore he will return on an annual basis. At his annual visit, we'll obtain IPSS score and exam.    2. Penile Ulcer- Resolved.  3. PSA screening:   I explained to the patient that [place] (2013): The panel does not recommend routine PSA screening in men age 31+ years or any man with less than a 10 to 36 year life expectancy.  If the individual is in excellent health and after discussion it is decided to do a screening PSA, the threshold for biopsy should be raised to 10 ng/mL and if the PSA returns below 3 ng/mL, discontinue screening.  Last PSA was 1.2.  He agrees and screening has been discontinued.    Return in about 1 year (around 05/11/2017) for IPSS and exam.  These notes generated with voice  recognition software. I apologize for typographical errors.  Zara Council, [facility] [address], [place], [place] [address] [phone]

## 2016-06-04 DIAGNOSIS — Z961 Presence of intraocular lens: Secondary | ICD-10-CM | POA: Diagnosis not present

## 2016-06-19 DIAGNOSIS — K219 Gastro-esophageal reflux disease without esophagitis: Secondary | ICD-10-CM | POA: Diagnosis not present

## 2016-06-19 DIAGNOSIS — D696 Thrombocytopenia, unspecified: Secondary | ICD-10-CM | POA: Diagnosis not present

## 2016-06-19 DIAGNOSIS — Z125 Encounter for screening for malignant neoplasm of prostate: Secondary | ICD-10-CM | POA: Diagnosis not present

## 2016-06-19 DIAGNOSIS — N182 Chronic kidney disease, stage 2 (mild): Secondary | ICD-10-CM | POA: Diagnosis not present

## 2016-06-19 DIAGNOSIS — E7800 Pure hypercholesterolemia, unspecified: Secondary | ICD-10-CM | POA: Diagnosis not present

## 2016-07-17 DIAGNOSIS — K219 Gastro-esophageal reflux disease without esophagitis: Secondary | ICD-10-CM | POA: Diagnosis not present

## 2016-07-17 DIAGNOSIS — D696 Thrombocytopenia, unspecified: Secondary | ICD-10-CM | POA: Diagnosis not present

## 2016-07-17 DIAGNOSIS — E7800 Pure hypercholesterolemia, unspecified: Secondary | ICD-10-CM | POA: Diagnosis not present

## 2016-07-17 DIAGNOSIS — Z0000 Encounter for general adult medical examination without abnormal findings: Secondary | ICD-10-CM | POA: Diagnosis not present

## 2016-07-17 DIAGNOSIS — N182 Chronic kidney disease, stage 2 (mild): Secondary | ICD-10-CM | POA: Diagnosis not present

## 2016-07-17 DIAGNOSIS — L570 Actinic keratosis: Secondary | ICD-10-CM | POA: Diagnosis not present

## 2016-08-29 DIAGNOSIS — Z23 Encounter for immunization: Secondary | ICD-10-CM | POA: Diagnosis not present

## 2016-10-17 DIAGNOSIS — L570 Actinic keratosis: Secondary | ICD-10-CM | POA: Diagnosis not present

## 2016-10-17 DIAGNOSIS — X32XXXA Exposure to sunlight, initial encounter: Secondary | ICD-10-CM | POA: Diagnosis not present

## 2016-10-17 DIAGNOSIS — L821 Other seborrheic keratosis: Secondary | ICD-10-CM | POA: Diagnosis not present

## 2016-12-03 DIAGNOSIS — H01003 Unspecified blepharitis right eye, unspecified eyelid: Secondary | ICD-10-CM | POA: Diagnosis not present

## 2017-01-04 DIAGNOSIS — Z0000 Encounter for general adult medical examination without abnormal findings: Secondary | ICD-10-CM | POA: Diagnosis not present

## 2017-01-04 DIAGNOSIS — E7800 Pure hypercholesterolemia, unspecified: Secondary | ICD-10-CM | POA: Diagnosis not present

## 2017-01-04 DIAGNOSIS — N182 Chronic kidney disease, stage 2 (mild): Secondary | ICD-10-CM | POA: Diagnosis not present

## 2017-01-04 DIAGNOSIS — D696 Thrombocytopenia, unspecified: Secondary | ICD-10-CM | POA: Diagnosis not present

## 2017-01-04 DIAGNOSIS — K219 Gastro-esophageal reflux disease without esophagitis: Secondary | ICD-10-CM | POA: Diagnosis not present

## 2017-01-15 DIAGNOSIS — D696 Thrombocytopenia, unspecified: Secondary | ICD-10-CM | POA: Diagnosis not present

## 2017-01-15 DIAGNOSIS — K219 Gastro-esophageal reflux disease without esophagitis: Secondary | ICD-10-CM | POA: Diagnosis not present

## 2017-01-15 DIAGNOSIS — Z0000 Encounter for general adult medical examination without abnormal findings: Secondary | ICD-10-CM | POA: Diagnosis not present

## 2017-01-15 DIAGNOSIS — Z23 Encounter for immunization: Secondary | ICD-10-CM | POA: Diagnosis not present

## 2017-01-15 DIAGNOSIS — E7800 Pure hypercholesterolemia, unspecified: Secondary | ICD-10-CM | POA: Diagnosis not present

## 2017-04-17 DIAGNOSIS — Z86010 Personal history of colonic polyps: Secondary | ICD-10-CM | POA: Diagnosis not present

## 2017-04-17 DIAGNOSIS — Z8719 Personal history of other diseases of the digestive system: Secondary | ICD-10-CM | POA: Diagnosis not present

## 2017-04-17 DIAGNOSIS — Z800 Family history of malignant neoplasm of digestive organs: Secondary | ICD-10-CM | POA: Diagnosis not present

## 2017-04-17 DIAGNOSIS — K219 Gastro-esophageal reflux disease without esophagitis: Secondary | ICD-10-CM | POA: Diagnosis not present

## 2017-05-13 ENCOUNTER — Ambulatory Visit: Payer: PPO | Admitting: Urology

## 2017-05-20 NOTE — Progress Notes (Signed)
05/22/2017 9:01 AM   Gabriel Martin June 17, 1941 [id number]  Referring provider: [name], MD [address] [facility] [place], [place] [address]  Chief Complaint  Patient presents with  . Follow-up    HPI: Patient is 76 year old Caucasian male who presents today for a 1 year follow-up for penile ulcer and BPH with LUTS.  Penile ulcer Patient was prescribed nystatin cream and instructed on perineal hygiene. He was to contact the office if the ulcer did not resolve, as it was most likely balanitis caused by yeast.  He states the ulcer did resolve and he had no further issue with it.  BPH WITH LUTS His IPSS score today is 0 which is no lower urinary tract symptomatology. He is delighted with his quality life due to his urinary symptoms.   His previous I PSS score was 5/2.  He denies any dysuria, hematuria or suprapubic pain.  His has had TURP on 03/09/2013 with Dr. Elnoria Howard for BOO. He has been pleased with his voiding since the TURP. He also denies any recent fevers, chills, nausea or vomiting.  He does not have a family history of PCa.      IPSS    Row Name 05/22/17 0800         International Prostate Symptom Score   How often have you had the sensation of not emptying your bladder? Not at All     How often have you had to urinate less than every two hours? Not at All     How often have you found you stopped and started again several times when you urinated? Not at All     How often have you found it difficult to postpone urination? Not at All     How often have you had a weak urinary stream? Not at All     How often have you had to strain to start urination? Not at All     How many times did you typically get up at night to urinate? None     Total IPSS Score 0       Quality of Life due to urinary symptoms   If you were to spend the rest of your life with your urinary condition just the way it is now how would you feel about that? Delighted          Score:  1-7 Mild 8-19 Moderate 20-35 Severe    PMH: Past Medical History:  Diagnosis Date  . Acid reflux 05/12/2015  . Anemia   . Arthritis    finger thumb  . Benign enlargement of prostate   . Benign neoplasm of large bowel   . Benign prostatic hyperplasia with urinary obstruction 05/12/2015  . BPH (benign prostatic hyperplasia)   . BPH with obstruction/lower urinary tract symptoms 05/12/2015  . Change in blood platelet count 05/12/2015  . Chronic kidney disease 05/12/2015  . GERD (gastroesophageal reflux disease)   . HLD (hyperlipidemia) 05/12/2015  . Hyperlipidemia   . Nocturia associated with benign prostatic hypertrophy   . Penile ulcer 05/12/2015  . Skin cancer   . Thrombocytopenia [facility])     Surgical History: Past Surgical History:  Procedure Laterality Date  . CATARACT EXTRACTION W/PHACO Left 02/29/2016   Procedure: CATARACT EXTRACTION PHACO AND INTRAOCULAR LENS PLACEMENT ([place]) left;  Surgeon: Leandrew Koyanagi, MD;  Location: [place];  Service: Ophthalmology;  Laterality: Left;  . CATARACT EXTRACTION W/PHACO Right 04/25/2016   Procedure: CATARACT EXTRACTION PHACO AND INTRAOCULAR  LENS PLACEMENT ([facility]);  Surgeon: Leandrew Koyanagi, MD;  Location: [place];  Service: Ophthalmology;  Laterality: Right;  . HEMORRHOID SURGERY      Home Medications:  Allergies as of 05/22/2017   No Known Allergies     Medication List       Accurate as of 05/22/17  9:01 AM. Always use your most recent med list.          lovastatin 20 MG tablet Commonly known as:  MEVACOR Take 20 mg by mouth at bedtime.   ranitidine 75 MG tablet Commonly known as:  ZANTAC Take 150 mg by mouth 2 (two) times daily.       Allergies: No Known Allergies  Family History: Family History  Problem Relation Age of Onset  . Heart disease Brother     Social History:  reports that he quit smoking about 38 years ago. His smoking use included Cigarettes. He has never used  smokeless tobacco. He reports that he drinks about 6.0 oz of alcohol per week . He reports that he does not use drugs.  ROS: UROLOGY Frequent Urination?: Yes Hard to postpone urination?: No Burning/pain with urination?: No Get up at night to urinate?: Yes Leakage of urine?: No Urine stream starts and stops?: No Trouble starting stream?: No Do you have to strain to urinate?: No Blood in urine?: No Urinary tract infection?: No Sexually transmitted disease?: No Injury to kidneys or bladder?: No Painful intercourse?: No Weak stream?: No Erection problems?: No Penile pain?: No  Gastrointestinal Nausea?: No Vomiting?: No Indigestion/heartburn?: No Diarrhea?: No Constipation?: No  Constitutional Fever: No Night sweats?: No Weight loss?: No Fatigue?: No  Skin Itching?: No  Eyes Blurred vision?: No Double vision?: No  Ears/Nose/Throat Sore throat?: No Sinus problems?: No  Hematologic/Lymphatic Swollen glands?: No Easy bruising?: No  Cardiovascular Leg swelling?: No Chest pain?: No  Respiratory Cough?: No Shortness of breath?: No  Endocrine Excessive thirst?: No  Musculoskeletal Back pain?: No Joint pain?: No  Neurological Headaches?: No Dizziness?: No  Psychologic Depression?: No Anxiety?: No  Physical Exam: BP 134/84   Pulse 71   Temp 97.8 F (36.6 C)   Ht 5\' 6"  (1.676 m)   Wt 200 lb (90.7 kg)   BMI 32.28 kg/m   Constitutional: Well nourished. Alert and oriented, No acute distress. HEENT: [place] AT, moist mucus membranes. Trachea midline, no masses. Cardiovascular: No clubbing, cyanosis, or edema. Respiratory: Normal respiratory effort, no increased work of breathing. GI: Abdomen is soft, non tender, non distended, no abdominal masses. Liver and spleen not palpable.  No hernias appreciated.  Stool sample for occult testing is not indicated.   GU: No CVA tenderness.  No bladder fullness or masses.  Patient with uncircumcised phallus. Foreskin  easily retracted  Urethral meatus is patent.  No penile discharge.   Balanitis present.  Scrotum without lesions, cysts, rashes and/or edema.  Testicles are located scrotally bilaterally. No masses are appreciated in the testicles. Left and right epididymis are normal. Rectal: Patient with  normal sphincter tone. Anus and perineum without scarring or rashes. No rectal masses are appreciated. Prostate is approximately 55 grams, no nodules are appreciated. Seminal vesicles are normal. Skin: No rashes, bruises or suspicious lesions. Lymph: No cervical or inguinal adenopathy. Neurologic: Grossly intact, no focal deficits, moving all 4 extremities. Psychiatric: Normal mood and affect.  Laboratory Data: PSA history  1.2 ng/mL on 05/12/2015   Assessment & Plan:    1. BPH (benign prostatic hyperplasia) with LUTS- Patient  has been pleased with his urination since his TURP in 2014. His IPSS is 0/0.  His symptoms remain mild, therefore he will return on an annual basis. At his annual visit, we'll obtain IPSS score and exam.    2. Balanitis  - instructed patient on foreskin hygiene    Return in about 1 year (around 05/22/2018) for I PSS and exam.  These notes generated with voice recognition software. I apologize for typographical errors.  Zara Council, [facility] [address] [place], [place] [address] [phone]

## 2017-05-22 ENCOUNTER — Ambulatory Visit (INDEPENDENT_AMBULATORY_CARE_PROVIDER_SITE_OTHER): Payer: PPO | Admitting: Urology

## 2017-05-22 ENCOUNTER — Encounter: Payer: Self-pay | Admitting: Urology

## 2017-05-22 VITALS — BP 134/84 | HR 71 | Temp 97.8°F | Ht 66.0 in | Wt 200.0 lb

## 2017-05-22 DIAGNOSIS — N401 Enlarged prostate with lower urinary tract symptoms: Secondary | ICD-10-CM

## 2017-05-22 DIAGNOSIS — N481 Balanitis: Secondary | ICD-10-CM

## 2017-05-22 DIAGNOSIS — N138 Other obstructive and reflux uropathy: Secondary | ICD-10-CM | POA: Diagnosis not present

## 2017-07-01 ENCOUNTER — Encounter
Admission: RE | Disposition: A | Discharge status: Home / Self Care | Payer: Self-pay | Source: Ambulatory Visit | Attending: Unknown Physician Specialty

## 2017-07-01 ENCOUNTER — Ambulatory Visit: Payer: PPO | Admitting: Anesthesiology

## 2017-07-01 ENCOUNTER — Ambulatory Visit
Admission: RE | Admit: 2017-07-01 | Discharge: 2017-07-01 | Disposition: A | Discharge status: Home / Self Care | Payer: PPO | Source: Ambulatory Visit | Attending: Unknown Physician Specialty | Admitting: Unknown Physician Specialty

## 2017-07-01 DIAGNOSIS — Z87891 Personal history of nicotine dependence: Secondary | ICD-10-CM | POA: Insufficient documentation

## 2017-07-01 DIAGNOSIS — Z800 Family history of malignant neoplasm of digestive organs: Secondary | ICD-10-CM | POA: Diagnosis not present

## 2017-07-01 DIAGNOSIS — K640 First degree hemorrhoids: Secondary | ICD-10-CM | POA: Diagnosis not present

## 2017-07-01 DIAGNOSIS — Z1211 Encounter for screening for malignant neoplasm of colon: Secondary | ICD-10-CM | POA: Insufficient documentation

## 2017-07-01 DIAGNOSIS — K635 Polyp of colon: Secondary | ICD-10-CM | POA: Insufficient documentation

## 2017-07-01 DIAGNOSIS — K648 Other hemorrhoids: Secondary | ICD-10-CM | POA: Diagnosis not present

## 2017-07-01 DIAGNOSIS — D120 Benign neoplasm of cecum: Secondary | ICD-10-CM | POA: Diagnosis not present

## 2017-07-01 DIAGNOSIS — Z86010 Personal history of colonic polyps: Secondary | ICD-10-CM | POA: Insufficient documentation

## 2017-07-01 DIAGNOSIS — K5730 Diverticulosis of large intestine without perforation or abscess without bleeding: Secondary | ICD-10-CM | POA: Diagnosis not present

## 2017-07-01 DIAGNOSIS — E785 Hyperlipidemia, unspecified: Secondary | ICD-10-CM | POA: Diagnosis not present

## 2017-07-01 DIAGNOSIS — N189 Chronic kidney disease, unspecified: Secondary | ICD-10-CM | POA: Insufficient documentation

## 2017-07-01 DIAGNOSIS — K219 Gastro-esophageal reflux disease without esophagitis: Secondary | ICD-10-CM | POA: Diagnosis not present

## 2017-07-01 DIAGNOSIS — K5790 Diverticulosis of intestine, part unspecified, without perforation or abscess without bleeding: Secondary | ICD-10-CM | POA: Diagnosis not present

## 2017-07-01 DIAGNOSIS — Z79899 Other long term (current) drug therapy: Secondary | ICD-10-CM | POA: Insufficient documentation

## 2017-07-01 DIAGNOSIS — Z85828 Personal history of other malignant neoplasm of skin: Secondary | ICD-10-CM | POA: Diagnosis not present

## 2017-07-01 HISTORY — PX: COLONOSCOPY WITH PROPOFOL: SHX5780

## 2017-07-01 SURGERY — COLONOSCOPY WITH PROPOFOL
Anesthesia: General

## 2017-07-01 MED ORDER — LIDOCAINE HCL (CARDIAC) 20 MG/ML IV SOLN
INTRAVENOUS | Status: DC | PRN
  Administered 2017-07-01: 50 mg via INTRAVENOUS

## 2017-07-01 MED ORDER — PROPOFOL 10 MG/ML IV BOLUS
INTRAVENOUS | Status: DC | PRN
  Administered 2017-07-01: 50 mg via INTRAVENOUS

## 2017-07-01 MED ORDER — PROPOFOL 500 MG/50ML IV EMUL
INTRAVENOUS | Status: DC | PRN
  Administered 2017-07-01: 125 ug/kg/min via INTRAVENOUS

## 2017-07-01 MED ORDER — SODIUM CHLORIDE 0.9 % IV SOLN: INTRAVENOUS | Status: DC

## 2017-07-01 MED ORDER — LIDOCAINE HCL (PF) 2 % IJ SOLN
INTRAMUSCULAR | Status: AC
  Filled 2017-07-01: qty 2

## 2017-07-01 MED ORDER — PROPOFOL 500 MG/50ML IV EMUL
INTRAVENOUS | Status: AC
  Filled 2017-07-01: qty 50

## 2017-07-01 MED ORDER — SODIUM CHLORIDE 0.9 % IV SOLN
INTRAVENOUS | Status: DC
  Administered 2017-07-01: 10:00:00 via INTRAVENOUS

## 2017-07-01 NOTE — Transfer of Care (Signed)
Immediate Anesthesia Transfer of Care Note  Patient: Gabriel Martin  Procedure(s) Performed: Procedure(s): COLONOSCOPY WITH PROPOFOL (N/A)  Patient Location: PACU  Anesthesia Type:General  Level of Consciousness: sedated  Airway & Oxygen Therapy: Patient Spontanous Breathing and Patient connected to nasal cannula oxygen  Post-op Assessment: Report given to RN and Post -op Vital signs reviewed and stable  Post vital signs: Reviewed and stable  Last Vitals:  Vitals:   07/01/17 1203 07/01/17 1204  BP: (P) 106/73 106/73  Pulse: (P) 66 67  Resp: (P) 10   Temp: (!) 35.7 C     Last Pain:  Vitals:   07/01/17 1203  TempSrc: Tympanic         Complications: No apparent anesthesia complications

## 2017-07-01 NOTE — Anesthesia Preprocedure Evaluation (Signed)
Anesthesia Evaluation  Patient identified by MRN, date of birth, ID band Patient awake    Reviewed: Allergy & Precautions, H&P , NPO status , Patient's Chart, lab work & pertinent test results  Airway Mallampati: III  TM Distance: <3 FB Neck ROM: limited    Dental  (+) Poor Dentition, Chipped, Missing   Pulmonary neg shortness of breath, former smoker,           Cardiovascular Exercise Tolerance: Good (-) angina(-) Past MI and (-) DOE negative cardio ROS       Neuro/Psych negative neurological ROS  negative psych ROS   GI/Hepatic Neg liver ROS, GERD  Medicated and Controlled,  Endo/Other  negative endocrine ROS  Renal/GU Renal disease  negative genitourinary   Musculoskeletal  (+) Arthritis ,   Abdominal   Peds  Hematology negative hematology ROS (+)   Anesthesia Other Findings  Signs and symptoms suggestive of sleep apnea   Past Medical History: 05/12/2015: Acid reflux No date: Anemia No date: Arthritis     Comment:  finger thumb No date: Benign enlargement of prostate No date: Benign neoplasm of large bowel 05/12/2015: Benign prostatic hyperplasia with urinary obstruction No date: BPH (benign prostatic hyperplasia) 05/12/2015: BPH with obstruction/lower urinary tract symptoms 05/12/2015: Change in blood platelet count 05/12/2015: Chronic kidney disease No date: GERD (gastroesophageal reflux disease) 05/12/2015: HLD (hyperlipidemia) No date: Hyperlipidemia No date: Nocturia associated with benign prostatic hypertrophy 05/12/2015: Penile ulcer No date: Skin cancer No date: Thrombocytopenia ([place])  Past Surgical History: 02/29/2016: CATARACT EXTRACTION W/PHACO; Left     Comment:  Procedure: CATARACT EXTRACTION PHACO AND INTRAOCULAR               LENS PLACEMENT ([place]) left;  Surgeon: Leandrew Koyanagi,              MD;  Location: [place];  Service:               Ophthalmology;  Laterality:  Left; 04/25/2016: CATARACT EXTRACTION W/PHACO; Right     Comment:  Procedure: CATARACT EXTRACTION PHACO AND INTRAOCULAR               LENS PLACEMENT ([facility]);  Surgeon: Leandrew Koyanagi, MD;               Location: [place];  Service: Ophthalmology;                Laterality: Right; No date: HEMORRHOID SURGERY  BMI    Body Mass Index:  39.06 kg/m      Reproductive/Obstetrics negative OB ROS                             Anesthesia Physical Anesthesia Plan  ASA: III  Anesthesia Plan: General   Post-op Pain Management:    Induction: Intravenous  PONV Risk Score and Plan:   Airway Management Planned: Natural Airway and Nasal Cannula  Additional Equipment:   Intra-op Plan:   Post-operative Plan:   Informed Consent: I have reviewed the patients History and Physical, chart, labs and discussed the procedure including the risks, benefits and alternatives for the proposed anesthesia with the patient or authorized representative who has indicated his/her understanding and acceptance.   Dental Advisory Given  Plan Discussed with: Anesthesiologist, CRNA and Surgeon  Anesthesia Plan Comments: (Patient consented for risks of anesthesia including but not limited to:  - adverse reactions to medications - risk of intubation if required - damage to teeth,  lips or other oral mucosa - sore throat or hoarseness - Damage to heart, brain, lungs or loss of life  Patient voiced understanding.)        Anesthesia Quick Evaluation

## 2017-07-01 NOTE — Op Note (Signed)
[facility] Patient Name: Gabriel Martin Procedure Date: 07/01/2017 11:34 AM MRN: [id number] Account #: [id number] Date of Birth: 10-21-1941 Admit Type: Outpatient Age: 76 Room: [facility] ENDO ROOM 3 Gender: Male Note Status: Finalized Procedure:            Colonoscopy Indications:          High risk colon cancer surveillance: Personal history                        of colonic polyps Providers:            Manya Silvas, MD Referring MD:         [name], MD (Referring MD) Medicines:            Propofol per Anesthesia Complications:        No immediate complications. Procedure:            Pre-Anesthesia Assessment:                       - After reviewing the risks and benefits, the patient                        was deemed in satisfactory condition to undergo the                        procedure.                       After obtaining informed consent, the colonoscope was                        passed under direct vision. Throughout the procedure,                        the patient's blood pressure, pulse, and oxygen                        saturations were monitored continuously. The                        Colonoscope was introduced through the anus and                        advanced to the the cecum, identified by appendiceal                        orifice and ileocecal valve. The colonoscopy was                        performed without difficulty. The patient tolerated the                        procedure well. The quality of the bowel preparation                        was excellent. Findings:      A diminutive polyp was found in the cecum. The polyp was sessile. The       polyp was removed with a jumbo cold forceps. Resection and retrieval       were complete.      Multiple small and large-mouthed diverticula were  found in the sigmoid       colon, descending colon, splenic flexure, transverse colon, hepatic       flexure and  ascending colon.      Internal hemorrhoids were found during endoscopy. The hemorrhoids were       small and Grade I (internal hemorrhoids that do not prolapse).      The exam was otherwise without abnormality. Impression:           - One diminutive polyp in the cecum, removed with a                        jumbo cold forceps. Resected and retrieved.                       - Diverticulosis in the sigmoid colon, in the                        descending colon, at the splenic flexure, in the                        transverse colon, at the hepatic flexure and in the                        ascending colon.                       - Internal hemorrhoids.                       - The examination was otherwise normal. Recommendation:       - Await pathology results. Manya Silvas, MD 07/01/2017 12:01:48 PM This report has been signed electronically. Number of Addenda: 0 Note Initiated On: 07/01/2017 11:34 AM Scope Withdrawal Time: 0 hours 5 minutes 46 seconds  Total Procedure Duration: 0 hours 12 minutes 54 seconds       [facility]

## 2017-07-01 NOTE — Anesthesia Post-op Follow-up Note (Cosign Needed)
Anesthesia QCDR form completed.        

## 2017-07-01 NOTE — H&P (Signed)
Primary Care Physician:  [name], MD Primary Gastroenterologist:  Dr. Vira Agar  Pre-Procedure History & Physical: HPI:  Gabriel Martin is a 76 y.o. male is here for an colonoscopy.   Past Medical History:  Diagnosis Date  . Acid reflux 05/12/2015  . Anemia   . Arthritis    finger thumb  . Benign enlargement of prostate   . Benign neoplasm of large bowel   . Benign prostatic hyperplasia with urinary obstruction 05/12/2015  . BPH (benign prostatic hyperplasia)   . BPH with obstruction/lower urinary tract symptoms 05/12/2015  . Change in blood platelet count 05/12/2015  . Chronic kidney disease 05/12/2015  . GERD (gastroesophageal reflux disease)   . HLD (hyperlipidemia) 05/12/2015  . Hyperlipidemia   . Nocturia associated with benign prostatic hypertrophy   . Penile ulcer 05/12/2015  . Skin cancer   . Thrombocytopenia ([place])     Past Surgical History:  Procedure Laterality Date  . CATARACT EXTRACTION W/PHACO Left 02/29/2016   Procedure: CATARACT EXTRACTION PHACO AND INTRAOCULAR LENS PLACEMENT ([place]) left;  Surgeon: Leandrew Koyanagi, MD;  Location: [facility];  Service: Ophthalmology;  Laterality: Left;  . CATARACT EXTRACTION W/PHACO Right 04/25/2016   Procedure: CATARACT EXTRACTION PHACO AND INTRAOCULAR LENS PLACEMENT ([facility]);  Surgeon: Leandrew Koyanagi, MD;  Location: [facility];  Service: Ophthalmology;  Laterality: Right;  . HEMORRHOID SURGERY      Prior to Admission medications   Medication Sig Start Date End Date Taking? Authorizing Provider  lovastatin (MEVACOR) 20 MG tablet Take 20 mg by mouth at bedtime.   Yes [provider]  ranitidine (ZANTAC) 75 MG tablet Take 150 mg by mouth 2 (two) times daily.    Yes [provider]    Allergies as of 05/01/2017  . (No Known Allergies)    Family History  Problem Relation Age of Onset  . Heart disease Brother     Social History   Social History  . Marital status: Married     Spouse name: N/A  . Number of children: N/A  . Years of education: N/A   Occupational History  . Not on file.   Social History Main Topics  . Smoking status: Former Smoker    Types: Cigarettes    Quit date: 11/26/1978  . Smokeless tobacco: Never Used     Comment: quit 40 years ago  . Alcohol use 6.0 oz/week    10 Cans of beer per week     Comment: occasional  . Drug use: No  . Sexual activity: No   Other Topics Concern  . Not on file   Social History Narrative  . No narrative on file    Review of Systems: See HPI, otherwise negative ROS  Physical Exam: BP (!) 155/94   Pulse 67   Temp (!) 96.8 F (36 C) (Tympanic)   Resp 17   Ht 5' (1.524 m)   Wt 90.7 kg (200 lb)   SpO2 98%   BMI 39.06 kg/m  General:   Alert,  pleasant and cooperative in NAD Head:  Normocephalic and atraumatic. Neck:  Supple; no masses or thyromegaly. Lungs:  Clear throughout to auscultation.    Heart:  Regular rate and rhythm. Abdomen:  Soft, nontender and nondistended. Normal bowel sounds, without guarding, and without rebound.   Neurologic:  Alert and  oriented x4;  grossly normal neurologically.  Impression/Plan: Gabriel Martin is here for an colonoscopy to be performed for [facility] colon polyps  Risks, benefits, limitations, and  alternatives regarding  colonoscopy have been reviewed with the patient.  Questions have been answered.  All parties agreeable.   Gaylyn Cheers, MD  07/01/2017, 11:40 AM

## 2017-07-02 ENCOUNTER — Encounter: Payer: Self-pay | Admitting: Unknown Physician Specialty

## 2017-07-02 LAB — SURGICAL PATHOLOGY

## 2017-07-02 NOTE — Anesthesia Postprocedure Evaluation (Signed)
Anesthesia Post Note  Patient: Gabriel Martin  Procedure(s) Performed: Procedure(s) (LRB): COLONOSCOPY WITH PROPOFOL (N/A)  Patient location during evaluation: Endoscopy Anesthesia Type: General Level of consciousness: awake and alert Pain management: pain level controlled Vital Signs Assessment: post-procedure vital signs reviewed and stable Respiratory status: spontaneous breathing, nonlabored ventilation, respiratory function stable and patient connected to nasal cannula oxygen Cardiovascular status: blood pressure returned to baseline and stable Postop Assessment: no signs of nausea or vomiting Anesthetic complications: no     Last Vitals:  Vitals:   07/01/17 1204 07/01/17 1241  BP: 106/73 132/82  Pulse: 67   Resp:    Temp:      Last Pain:  Vitals:   07/01/17 1203  TempSrc: Tympanic                 Precious Haws Siaosi Alter

## 2017-07-17 DIAGNOSIS — E7800 Pure hypercholesterolemia, unspecified: Secondary | ICD-10-CM | POA: Diagnosis not present

## 2017-07-17 DIAGNOSIS — Z0000 Encounter for general adult medical examination without abnormal findings: Secondary | ICD-10-CM | POA: Diagnosis not present

## 2017-07-17 DIAGNOSIS — D696 Thrombocytopenia, unspecified: Secondary | ICD-10-CM | POA: Diagnosis not present

## 2017-07-17 DIAGNOSIS — K219 Gastro-esophageal reflux disease without esophagitis: Secondary | ICD-10-CM | POA: Diagnosis not present

## 2017-07-24 DIAGNOSIS — D696 Thrombocytopenia, unspecified: Secondary | ICD-10-CM | POA: Diagnosis not present

## 2017-07-24 DIAGNOSIS — N182 Chronic kidney disease, stage 2 (mild): Secondary | ICD-10-CM | POA: Diagnosis not present

## 2017-07-24 DIAGNOSIS — Z0000 Encounter for general adult medical examination without abnormal findings: Secondary | ICD-10-CM | POA: Diagnosis not present

## 2017-07-24 DIAGNOSIS — E7800 Pure hypercholesterolemia, unspecified: Secondary | ICD-10-CM | POA: Diagnosis not present

## 2017-07-24 DIAGNOSIS — K219 Gastro-esophageal reflux disease without esophagitis: Secondary | ICD-10-CM | POA: Diagnosis not present

## 2017-08-30 DIAGNOSIS — Z23 Encounter for immunization: Secondary | ICD-10-CM | POA: Diagnosis not present

## 2017-12-17 DIAGNOSIS — H01003 Unspecified blepharitis right eye, unspecified eyelid: Secondary | ICD-10-CM | POA: Diagnosis not present

## 2018-01-17 DIAGNOSIS — K219 Gastro-esophageal reflux disease without esophagitis: Secondary | ICD-10-CM | POA: Diagnosis not present

## 2018-01-17 DIAGNOSIS — Z0000 Encounter for general adult medical examination without abnormal findings: Secondary | ICD-10-CM | POA: Diagnosis not present

## 2018-01-17 DIAGNOSIS — N182 Chronic kidney disease, stage 2 (mild): Secondary | ICD-10-CM | POA: Diagnosis not present

## 2018-01-17 DIAGNOSIS — E7800 Pure hypercholesterolemia, unspecified: Secondary | ICD-10-CM | POA: Diagnosis not present

## 2018-01-17 DIAGNOSIS — D696 Thrombocytopenia, unspecified: Secondary | ICD-10-CM | POA: Diagnosis not present

## 2018-01-24 DIAGNOSIS — E7800 Pure hypercholesterolemia, unspecified: Secondary | ICD-10-CM | POA: Diagnosis not present

## 2018-01-24 DIAGNOSIS — Z0000 Encounter for general adult medical examination without abnormal findings: Secondary | ICD-10-CM | POA: Diagnosis not present

## 2018-01-24 DIAGNOSIS — N182 Chronic kidney disease, stage 2 (mild): Secondary | ICD-10-CM | POA: Diagnosis not present

## 2018-01-24 DIAGNOSIS — K219 Gastro-esophageal reflux disease without esophagitis: Secondary | ICD-10-CM | POA: Diagnosis not present

## 2018-05-21 NOTE — Progress Notes (Signed)
05/22/2018 10:21 AM   Gabriel Martin 11-05-1941 [id number]  Referring provider: [name], MD [address] [facility] [place], [place] [address]  No chief complaint on file.   HPI: Patient is 77 year old Caucasian male who presents today for a 1 year follow-up for penile ulcer and BPH with LUTS.  Penile ulcer Patient was prescribed nystatin cream and instructed on perineal hygiene. He was to contact the office if the ulcer did not resolve, as it was most likely balanitis caused by yeast.  He states the ulcer did resolve and he had no further issue with it.  BPH WITH LUTS His IPSS score today is 3 which is mild lower urinary tract symptomatology. He is mostly satisfied with his quality life due to his urinary symptoms.   His previous I PSS score was 0/0.  He denies any dysuria, hematuria or suprapubic pain.  His has had TURP on 03/09/2013 with Dr. Elnoria Howard for BOO. He has been pleased with his voiding since the TURP. He also denies any recent fevers, chills, nausea or vomiting.  He does not have a family history of PCa. IPSS    Row Name 05/22/18 0900         International Prostate Symptom Score   How often have you had the sensation of not emptying your bladder?  Not at All     How often have you had to urinate less than every two hours?  Not at All     How often have you found you stopped and started again several times when you urinated?  Not at All     How often have you found it difficult to postpone urination?  Not at All     How often have you had a weak urinary stream?  Not at All     How often have you had to strain to start urination?  Not at All     How many times did you typically get up at night to urinate?  3 Times     Total IPSS Score  3       Quality of Life due to urinary symptoms   If you were to spend the rest of your life with your urinary condition just the way it is now how would you feel about that?  Mostly Satisfied         Score:  1-7 Mild 8-19 Moderate 20-35 Severe    PMH: Past Medical History:  Diagnosis Date  . Acid reflux 05/12/2015  . Anemia   . Arthritis    finger thumb  . Benign enlargement of prostate   . Benign neoplasm of large bowel   . Benign prostatic hyperplasia with urinary obstruction 05/12/2015  . BPH (benign prostatic hyperplasia)   . BPH with obstruction/lower urinary tract symptoms 05/12/2015  . Change in blood platelet count 05/12/2015  . Chronic kidney disease 05/12/2015  . GERD (gastroesophageal reflux disease)   . HLD (hyperlipidemia) 05/12/2015  . Hyperlipidemia   . Nocturia associated with benign prostatic hypertrophy   . Penile ulcer 05/12/2015  . Skin cancer   . Thrombocytopenia [facility])     Surgical History: Past Surgical History:  Procedure Laterality Date  . CATARACT EXTRACTION W/PHACO Left 02/29/2016   Procedure: CATARACT EXTRACTION PHACO AND INTRAOCULAR LENS PLACEMENT ([place]) left;  Surgeon: Leandrew Koyanagi, MD;  Location: [place];  Service: Ophthalmology;  Laterality: Left;  . CATARACT EXTRACTION W/PHACO Right 04/25/2016   Procedure: CATARACT EXTRACTION PHACO AND  INTRAOCULAR LENS PLACEMENT ([facility]);  Surgeon: Leandrew Koyanagi, MD;  Location: [place];  Service: Ophthalmology;  Laterality: Right;  . COLONOSCOPY WITH PROPOFOL N/A 07/01/2017   Procedure: COLONOSCOPY WITH PROPOFOL;  Surgeon: Manya Silvas, MD;  Location: [facility] ENDOSCOPY;  Service: Endoscopy;  Laterality: N/A;  . HEMORRHOID SURGERY      Home Medications:  Allergies as of 05/22/2018   No Known Allergies     Medication List        Accurate as of 05/22/18 10:21 AM. Always use your most recent med list.          lovastatin 20 MG tablet Commonly known as:  MEVACOR Take 20 mg by mouth at bedtime.   ranitidine 75 MG tablet Commonly known as:  ZANTAC Take 150 mg by mouth 2 (two) times daily.       Allergies: No Known Allergies  Family History: Family History   Problem Relation Age of Onset  . Heart disease Brother     Social History:  reports that he quit smoking about 39 years ago. His smoking use included cigarettes. He has never used smokeless tobacco. He reports that he drinks about 6.0 oz of alcohol per week. He reports that he does not use drugs.  ROS: UROLOGY Frequent Urination?: No Hard to postpone urination?: No Burning/pain with urination?: No Get up at night to urinate?: No Leakage of urine?: No Urine stream starts and stops?: No Trouble starting stream?: No Do you have to strain to urinate?: No Blood in urine?: No Urinary tract infection?: No Sexually transmitted disease?: No Injury to kidneys or bladder?: No Painful intercourse?: No Weak stream?: No Erection problems?: No Penile pain?: No  Gastrointestinal Nausea?: No Vomiting?: No Indigestion/heartburn?: No Diarrhea?: No Constipation?: No  Constitutional Fever: No Night sweats?: No Weight loss?: No Fatigue?: No  Skin Skin rash/lesions?: No Itching?: No  Eyes Blurred vision?: No Double vision?: No  Ears/Nose/Throat Sore throat?: No Sinus problems?: No  Hematologic/Lymphatic Swollen glands?: No Easy bruising?: No  Cardiovascular Leg swelling?: No Chest pain?: No  Respiratory Cough?: No Shortness of breath?: No  Endocrine Excessive thirst?: No  Musculoskeletal Back pain?: No Joint pain?: No  Neurological Headaches?: No Dizziness?: No  Psychologic Depression?: No Anxiety?: No  Physical Exam: BP 111/65   Pulse 71   Resp 16   Ht 5\' 6"  (1.676 m)   Wt 210 lb 1.6 oz (95.3 kg)   SpO2 96%   BMI 33.91 kg/m   Constitutional: Well nourished. Alert and oriented, No acute distress. HEENT: [place] AT, moist mucus membranes. Trachea midline, no masses. Cardiovascular: No clubbing, cyanosis, or edema. Respiratory: Normal respiratory effort, no increased work of breathing. GI: Abdomen is soft, non tender, non distended, no abdominal masses.  Liver and spleen not palpable.  No hernias appreciated.  Stool sample for occult testing is not indicated.   GU: No CVA tenderness.  No bladder fullness or masses.  Patient with uncircumcised phallus.  Foreskin easily retracted  Urethral meatus is patent.  No penile discharge. No penile lesions or rashes. Scrotum without lesions, cysts, rashes and/or edema.  Testicles are located scrotally bilaterally. No masses are appreciated in the testicles. Left and right epididymis are normal. Rectal: Patient with  normal sphincter tone. Anus and perineum without scarring or rashes. No rectal masses are appreciated. Prostate is approximately 60 + grams, could not palpate the entire gland could only palpate apex, no  nodules are appreciated. Seminal vesicles were not palpable.   Skin: No rashes,  bruises or suspicious lesions. Lymph: No cervical or inguinal adenopathy. Neurologic: Grossly intact, no focal deficits, moving all 4 extremities. Psychiatric: Normal mood and affect.   Laboratory Data: PSA history  1.2 ng/mL on 05/12/2015  I have reviewed the labs.  Assessment & Plan:    1. BPH (benign prostatic hyperplasia) with LUTS- Patient has been pleased with his urination since his TURP in 2014. His IPSS is 3/2.  This is slightly worse.  His symptoms remain mild, therefore he will return on an annual basis. At his annual visit, we'll obtain IPSS score and exam.    2. Balanitis Resolved     Return in about 1 year (around 05/23/2019) for I PSS and exam .  These notes generated with voice recognition software. I apologize for typographical errors.  Zara Council, PA-C  [facility] [address] [place], [place] [address] [phone]

## 2018-05-22 ENCOUNTER — Encounter: Payer: Self-pay | Admitting: Urology

## 2018-05-22 ENCOUNTER — Ambulatory Visit: Payer: PPO | Admitting: Urology

## 2018-05-22 VITALS — BP 111/65 | HR 71 | Resp 16 | Ht 66.0 in | Wt 210.1 lb

## 2018-05-22 DIAGNOSIS — N138 Other obstructive and reflux uropathy: Secondary | ICD-10-CM | POA: Diagnosis not present

## 2018-05-22 DIAGNOSIS — N401 Enlarged prostate with lower urinary tract symptoms: Secondary | ICD-10-CM

## 2018-07-18 DIAGNOSIS — K219 Gastro-esophageal reflux disease without esophagitis: Secondary | ICD-10-CM | POA: Diagnosis not present

## 2018-07-18 DIAGNOSIS — N182 Chronic kidney disease, stage 2 (mild): Secondary | ICD-10-CM | POA: Diagnosis not present

## 2018-07-18 DIAGNOSIS — E7800 Pure hypercholesterolemia, unspecified: Secondary | ICD-10-CM | POA: Diagnosis not present

## 2018-07-25 DIAGNOSIS — Z0000 Encounter for general adult medical examination without abnormal findings: Secondary | ICD-10-CM | POA: Diagnosis not present

## 2018-07-25 DIAGNOSIS — N182 Chronic kidney disease, stage 2 (mild): Secondary | ICD-10-CM | POA: Diagnosis not present

## 2018-07-25 DIAGNOSIS — E7800 Pure hypercholesterolemia, unspecified: Secondary | ICD-10-CM | POA: Diagnosis not present

## 2018-07-25 DIAGNOSIS — D696 Thrombocytopenia, unspecified: Secondary | ICD-10-CM | POA: Diagnosis not present

## 2018-07-25 DIAGNOSIS — K219 Gastro-esophageal reflux disease without esophagitis: Secondary | ICD-10-CM | POA: Diagnosis not present

## 2018-09-01 DIAGNOSIS — Z23 Encounter for immunization: Secondary | ICD-10-CM | POA: Diagnosis not present

## 2018-09-17 DIAGNOSIS — D485 Neoplasm of uncertain behavior of skin: Secondary | ICD-10-CM | POA: Diagnosis not present

## 2018-09-17 DIAGNOSIS — C44519 Basal cell carcinoma of skin of other part of trunk: Secondary | ICD-10-CM | POA: Diagnosis not present

## 2018-09-17 DIAGNOSIS — L821 Other seborrheic keratosis: Secondary | ICD-10-CM | POA: Diagnosis not present

## 2018-09-17 DIAGNOSIS — C44622 Squamous cell carcinoma of skin of right upper limb, including shoulder: Secondary | ICD-10-CM | POA: Diagnosis not present

## 2018-09-17 DIAGNOSIS — L570 Actinic keratosis: Secondary | ICD-10-CM | POA: Diagnosis not present

## 2018-09-17 DIAGNOSIS — X32XXXA Exposure to sunlight, initial encounter: Secondary | ICD-10-CM | POA: Diagnosis not present

## 2018-10-09 DIAGNOSIS — C44622 Squamous cell carcinoma of skin of right upper limb, including shoulder: Secondary | ICD-10-CM | POA: Diagnosis not present

## 2018-10-22 DIAGNOSIS — C44519 Basal cell carcinoma of skin of other part of trunk: Secondary | ICD-10-CM | POA: Diagnosis not present

## 2019-01-19 DIAGNOSIS — D696 Thrombocytopenia, unspecified: Secondary | ICD-10-CM | POA: Diagnosis not present

## 2019-01-19 DIAGNOSIS — N182 Chronic kidney disease, stage 2 (mild): Secondary | ICD-10-CM | POA: Diagnosis not present

## 2019-01-19 DIAGNOSIS — K219 Gastro-esophageal reflux disease without esophagitis: Secondary | ICD-10-CM | POA: Diagnosis not present

## 2019-01-19 DIAGNOSIS — E7800 Pure hypercholesterolemia, unspecified: Secondary | ICD-10-CM | POA: Diagnosis not present

## 2019-01-19 DIAGNOSIS — Z0000 Encounter for general adult medical examination without abnormal findings: Secondary | ICD-10-CM | POA: Diagnosis not present

## 2019-01-28 DIAGNOSIS — N182 Chronic kidney disease, stage 2 (mild): Secondary | ICD-10-CM | POA: Diagnosis not present

## 2019-01-28 DIAGNOSIS — L84 Corns and callosities: Secondary | ICD-10-CM | POA: Diagnosis not present

## 2019-01-28 DIAGNOSIS — D696 Thrombocytopenia, unspecified: Secondary | ICD-10-CM | POA: Diagnosis not present

## 2019-01-28 DIAGNOSIS — K219 Gastro-esophageal reflux disease without esophagitis: Secondary | ICD-10-CM | POA: Diagnosis not present

## 2019-01-28 DIAGNOSIS — Z23 Encounter for immunization: Secondary | ICD-10-CM | POA: Diagnosis not present

## 2019-01-28 DIAGNOSIS — Z0000 Encounter for general adult medical examination without abnormal findings: Secondary | ICD-10-CM | POA: Diagnosis not present

## 2019-01-28 DIAGNOSIS — Z6833 Body mass index (BMI) 33.0-33.9, adult: Secondary | ICD-10-CM | POA: Diagnosis not present

## 2019-01-28 DIAGNOSIS — E7800 Pure hypercholesterolemia, unspecified: Secondary | ICD-10-CM | POA: Diagnosis not present

## 2019-04-24 DIAGNOSIS — Z08 Encounter for follow-up examination after completed treatment for malignant neoplasm: Secondary | ICD-10-CM | POA: Diagnosis not present

## 2019-04-24 DIAGNOSIS — L84 Corns and callosities: Secondary | ICD-10-CM | POA: Diagnosis not present

## 2019-04-24 DIAGNOSIS — Z85828 Personal history of other malignant neoplasm of skin: Secondary | ICD-10-CM | POA: Diagnosis not present

## 2019-04-24 DIAGNOSIS — L570 Actinic keratosis: Secondary | ICD-10-CM | POA: Diagnosis not present

## 2019-04-24 DIAGNOSIS — L821 Other seborrheic keratosis: Secondary | ICD-10-CM | POA: Diagnosis not present

## 2019-04-24 DIAGNOSIS — X32XXXA Exposure to sunlight, initial encounter: Secondary | ICD-10-CM | POA: Diagnosis not present

## 2019-05-24 NOTE — Progress Notes (Addendum)
05/25/2019 9:10 AM   Gabriel Martin 1941-04-01 [id number]  Referring provider: [name], MD [address] [facility] [place],  [place] [address]  Chief Complaint  Patient presents with  . Benign Prostatic Hypertrophy    Follow  up    HPI: Patient is 78 year old Caucasian male who presents today for a 1 year follow-up for BPH with LUTS.  BPH WITH LUTS  (prostate and/or bladder) IPSS score: 16/3    PVR: 5 mL    Previous score: 3/2     Major complaint(s):  Nocturia x 1-3 x several years. Denies any dysuria, hematuria or suprapubic pain.   His has had a TURP in 02/2013 with Dr. Elnoria Howard for BOO.     Denies any recent fevers, chills, nausea or vomiting. IPSS    Row Name 05/25/19 0800         International Prostate Symptom Score   How often have you had the sensation of not emptying your bladder?  More than half the time     How often have you had to urinate less than every two hours?  About half the time     How often have you found you stopped and started again several times when you urinated?  Less than 1 in 5 times     How often have you found it difficult to postpone urination?  Less than half the time     How often have you had a weak urinary stream?  Less than 1 in 5 times     How often have you had to strain to start urination?  Less than half the time     How many times did you typically get up at night to urinate?  3 Times     Total IPSS Score  16       Quality of Life due to urinary symptoms   If you were to spend the rest of your life with your urinary condition just the way it is now how would you feel about that?  Mixed        Score:  1-7 Mild 8-19 Moderate 20-35 Severe    PMH: Past Medical History:  Diagnosis Date  . Acid reflux 05/12/2015  . Anemia   . Arthritis    finger thumb  . Benign enlargement of prostate   . Benign neoplasm of large bowel   . Benign prostatic hyperplasia with urinary obstruction 05/12/2015  .  BPH (benign prostatic hyperplasia)   . BPH with obstruction/lower urinary tract symptoms 05/12/2015  . Change in blood platelet count 05/12/2015  . Chronic kidney disease 05/12/2015  . GERD (gastroesophageal reflux disease)   . HLD (hyperlipidemia) 05/12/2015  . Hyperlipidemia   . Nocturia associated with benign prostatic hypertrophy   . Penile ulcer 05/12/2015  . Skin cancer   . Thrombocytopenia [facility])     Surgical History: Past Surgical History:  Procedure Laterality Date  . CATARACT EXTRACTION W/PHACO Left 02/29/2016   Procedure: CATARACT EXTRACTION PHACO AND INTRAOCULAR LENS PLACEMENT ([place]) left;  Surgeon: Leandrew Koyanagi, MD;  Location: [place];  Service: Ophthalmology;  Laterality: Left;  . CATARACT EXTRACTION W/PHACO Right 04/25/2016   Procedure: CATARACT EXTRACTION PHACO AND INTRAOCULAR LENS PLACEMENT ([facility]);  Surgeon: Leandrew Koyanagi, MD;  Location: [place];  Service: Ophthalmology;  Laterality: Right;  . COLONOSCOPY WITH PROPOFOL N/A 07/01/2017   Procedure: COLONOSCOPY WITH PROPOFOL;  Surgeon: Manya Silvas, MD;  [facility]: [facility] ENDOSCOPY;  Service: Endoscopy;  Laterality: N/A;  . HEMORRHOID SURGERY      Home Medications:  Allergies as of 05/25/2019   No Known Allergies     Medication List       Accurate as of May 25, 2019  9:10 AM. If you have any questions, ask your nurse or doctor.        lovastatin 20 MG tablet Commonly known as: MEVACOR Take 20 mg by mouth at bedtime.   ranitidine 75 MG tablet Commonly known as: ZANTAC Take 150 mg by mouth 2 (two) times daily.       Allergies: No Known Allergies  Family History: Family History  Problem Relation Age of Onset  . Heart disease Brother     Social History:  reports that he quit smoking about 40 years ago. His smoking use included cigarettes. He has never used smokeless tobacco. He reports current alcohol use of about 10.0 standard drinks of alcohol per week. He reports that he  does not use drugs.  ROS: UROLOGY Frequent Urination?: No Hard to postpone urination?: No Burning/pain with urination?: No Get up at night to urinate?: No Leakage of urine?: No Urine stream starts and stops?: No Trouble starting stream?: No Do you have to strain to urinate?: No Blood in urine?: No Urinary tract infection?: No Sexually transmitted disease?: No Injury to kidneys or bladder?: No Painful intercourse?: No Weak stream?: No Erection problems?: No Penile pain?: No  Gastrointestinal Nausea?: No Vomiting?: No Indigestion/heartburn?: No Diarrhea?: No Constipation?: No  Constitutional Fever: No Night sweats?: No Weight loss?: No Fatigue?: No  Skin Skin rash/lesions?: No Itching?: No  Eyes Blurred vision?: No Double vision?: No  Ears/Nose/Throat Sore throat?: No Sinus problems?: No  Hematologic/Lymphatic Swollen glands?: No Easy bruising?: No  Cardiovascular Leg swelling?: No Chest pain?: No  Respiratory Cough?: No Shortness of breath?: No  Endocrine Excessive thirst?: No  Musculoskeletal Back pain?: No Joint pain?: No  Neurological Headaches?: No Dizziness?: No  Psychologic Depression?: No Anxiety?: No  Physical Exam: BP (!) 165/82   Pulse 76   Ht 5\' 6"  (1.676 m)   Wt 200 lb (90.7 kg)   BMI 32.28 kg/m   Constitutional:  Well nourished. Alert and oriented, No acute distress. HEENT: [place] AT, moist mucus membranes.  Trachea midline, no masses. Cardiovascular: No clubbing, cyanosis, or edema. Respiratory: Normal respiratory effort, no increased work of breathing. GI: Abdomen is soft, non tender, non distended, no abdominal masses. Liver and spleen not palpable.  No hernias appreciated.  Stool sample for occult testing is not indicated.   GU: No CVA tenderness.  No bladder fullness or masses.  Patient with uncircumcised phallus.  Foreskin easily retracted.   Urethral meatus is patent.  No penile discharge. No penile lesions or rashes.  Scrotum without lesions, cysts, rashes and/or edema.  Testicles are located scrotally bilaterally. No masses are appreciated in the testicles. Left and right epididymis are normal. Rectal: Patient with  normal sphincter tone. Anus and perineum without scarring or rashes. No rectal masses are appreciated. Prostate is approximately 60 grams, could only palpate the apex, no nodules are appreciated. Skin: No rashes, bruises or suspicious lesions. Lymph: No inguinal adenopathy. Neurologic: Grossly intact, no focal deficits, moving all 4 extremities. Psychiatric: Normal mood and affect.   Laboratory Data: PSA history  1.2 ng/mL on 05/12/2015  0.7 ng/mL on 06/19/2016 I have reviewed the labs.  Pertinent Imaging Results for TEDFORD, BERG (MRN [id number]) as of 05/25/2019 10:44  Ref. Range 05/25/2019 09:07  Scan Result Unknown 5    Assessment & Plan:    1. BPH with LUTS IPSS score is 16/3, it is worsening Continue conservative management, avoiding bladder irritants and timed voiding's Most bothersome symptoms is/are nocturia RTC in 12 months for IPSS and exam   2. Nocturia I explained to the patient that nocturia is often multi-factorial and difficult to treat.  Sleeping disorders, heart conditions, peripheral vascular disease, diabetes, an enlarged prostate for men, an urethral stricture causing bladder outlet obstruction and/or certain medications can contribute to nocturia. I have suggested that the patient avoid caffeine after noon and alcohol in the evening.  He or she may also benefit from fluid restrictions after 6:00 in the evening and voiding just prior to bedtime. I have explained that research studies have showed that over 84% of patients with sleep apnea reported frequent nighttime urination.   With sleep apnea, oxygen decreases, carbon dioxide increases, the blood become more acidic, the heart rate drops and blood vessels in the lung constrict.  The body is then alerted that  something is very wrong. The sleeper must wake enough to reopen the airway. By this time, the heart is racing and experiences a false signal of fluid overload. The heart excretes a hormone-like protein that tells the body to get rid of sodium and water, resulting in nocturia. I also informed the patient that a recent study noted that decreasing sodium intake to 2.3 grams daily, if they don't have issues with hyponatremia, can also reduce the number of nightly voids The patient may benefit from a discussion with his or her primary care physician to see if he or she has risk factors for sleep apnea or other sleep disturbances and obtaining a sleep study.  Return in about 1 year (around 05/24/2020) for I PSS and exam .  These notes generated with voice recognition software. I apologize for typographical errors.  Zara Council, PA-C  [facility] [address] [place], [place] [address] [phone]

## 2019-05-25 ENCOUNTER — Other Ambulatory Visit: Payer: Self-pay

## 2019-05-25 ENCOUNTER — Encounter: Payer: Self-pay | Admitting: Urology

## 2019-05-25 ENCOUNTER — Ambulatory Visit: Payer: PPO | Admitting: Urology

## 2019-05-25 VITALS — BP 165/82 | HR 76 | Ht 66.0 in | Wt 200.0 lb

## 2019-05-25 DIAGNOSIS — N138 Other obstructive and reflux uropathy: Secondary | ICD-10-CM | POA: Diagnosis not present

## 2019-05-25 DIAGNOSIS — R351 Nocturia: Secondary | ICD-10-CM | POA: Diagnosis not present

## 2019-05-25 DIAGNOSIS — N401 Enlarged prostate with lower urinary tract symptoms: Secondary | ICD-10-CM

## 2019-05-25 LAB — BLADDER SCAN AMB NON-IMAGING: Scan Result: 5

## 2019-07-30 DIAGNOSIS — N182 Chronic kidney disease, stage 2 (mild): Secondary | ICD-10-CM | POA: Diagnosis not present

## 2019-07-30 DIAGNOSIS — E7800 Pure hypercholesterolemia, unspecified: Secondary | ICD-10-CM | POA: Diagnosis not present

## 2019-07-30 DIAGNOSIS — K219 Gastro-esophageal reflux disease without esophagitis: Secondary | ICD-10-CM | POA: Diagnosis not present

## 2019-07-30 DIAGNOSIS — Z6833 Body mass index (BMI) 33.0-33.9, adult: Secondary | ICD-10-CM | POA: Diagnosis not present

## 2019-07-30 DIAGNOSIS — D696 Thrombocytopenia, unspecified: Secondary | ICD-10-CM | POA: Diagnosis not present

## 2019-08-06 DIAGNOSIS — Z6834 Body mass index (BMI) 34.0-34.9, adult: Secondary | ICD-10-CM | POA: Diagnosis not present

## 2019-08-06 DIAGNOSIS — E7800 Pure hypercholesterolemia, unspecified: Secondary | ICD-10-CM | POA: Diagnosis not present

## 2019-08-06 DIAGNOSIS — Z0000 Encounter for general adult medical examination without abnormal findings: Secondary | ICD-10-CM | POA: Diagnosis not present

## 2019-08-06 DIAGNOSIS — D696 Thrombocytopenia, unspecified: Secondary | ICD-10-CM | POA: Diagnosis not present

## 2019-08-06 DIAGNOSIS — N183 Chronic kidney disease, stage 3 (moderate): Secondary | ICD-10-CM | POA: Diagnosis not present

## 2019-08-06 DIAGNOSIS — K219 Gastro-esophageal reflux disease without esophagitis: Secondary | ICD-10-CM | POA: Diagnosis not present

## 2019-08-14 DIAGNOSIS — Z23 Encounter for immunization: Secondary | ICD-10-CM | POA: Diagnosis not present

## 2019-12-24 DIAGNOSIS — H01003 Unspecified blepharitis right eye, unspecified eyelid: Secondary | ICD-10-CM | POA: Diagnosis not present

## 2020-01-27 DIAGNOSIS — D696 Thrombocytopenia, unspecified: Secondary | ICD-10-CM | POA: Diagnosis not present

## 2020-01-27 DIAGNOSIS — Z6834 Body mass index (BMI) 34.0-34.9, adult: Secondary | ICD-10-CM | POA: Diagnosis not present

## 2020-01-27 DIAGNOSIS — N183 Chronic kidney disease, stage 3 unspecified: Secondary | ICD-10-CM | POA: Diagnosis not present

## 2020-01-27 DIAGNOSIS — K219 Gastro-esophageal reflux disease without esophagitis: Secondary | ICD-10-CM | POA: Diagnosis not present

## 2020-01-27 DIAGNOSIS — Z0000 Encounter for general adult medical examination without abnormal findings: Secondary | ICD-10-CM | POA: Diagnosis not present

## 2020-01-27 DIAGNOSIS — E7800 Pure hypercholesterolemia, unspecified: Secondary | ICD-10-CM | POA: Diagnosis not present

## 2020-02-03 DIAGNOSIS — D696 Thrombocytopenia, unspecified: Secondary | ICD-10-CM | POA: Diagnosis not present

## 2020-02-03 DIAGNOSIS — M79604 Pain in right leg: Secondary | ICD-10-CM | POA: Diagnosis not present

## 2020-02-03 DIAGNOSIS — Z0000 Encounter for general adult medical examination without abnormal findings: Secondary | ICD-10-CM | POA: Diagnosis not present

## 2020-02-03 DIAGNOSIS — Z6834 Body mass index (BMI) 34.0-34.9, adult: Secondary | ICD-10-CM | POA: Diagnosis not present

## 2020-02-03 DIAGNOSIS — E7800 Pure hypercholesterolemia, unspecified: Secondary | ICD-10-CM | POA: Diagnosis not present

## 2020-02-03 DIAGNOSIS — M79605 Pain in left leg: Secondary | ICD-10-CM | POA: Diagnosis not present

## 2020-04-26 DIAGNOSIS — M109 Gout, unspecified: Secondary | ICD-10-CM | POA: Diagnosis not present

## 2020-04-28 DIAGNOSIS — X32XXXA Exposure to sunlight, initial encounter: Secondary | ICD-10-CM | POA: Diagnosis not present

## 2020-04-28 DIAGNOSIS — D225 Melanocytic nevi of trunk: Secondary | ICD-10-CM | POA: Diagnosis not present

## 2020-04-28 DIAGNOSIS — L821 Other seborrheic keratosis: Secondary | ICD-10-CM | POA: Diagnosis not present

## 2020-04-28 DIAGNOSIS — D2262 Melanocytic nevi of left upper limb, including shoulder: Secondary | ICD-10-CM | POA: Diagnosis not present

## 2020-04-28 DIAGNOSIS — D2261 Melanocytic nevi of right upper limb, including shoulder: Secondary | ICD-10-CM | POA: Diagnosis not present

## 2020-04-28 DIAGNOSIS — L570 Actinic keratosis: Secondary | ICD-10-CM | POA: Diagnosis not present

## 2020-05-03 DIAGNOSIS — M109 Gout, unspecified: Secondary | ICD-10-CM | POA: Diagnosis not present

## 2020-05-20 ENCOUNTER — Other Ambulatory Visit: Payer: Self-pay | Admitting: *Deleted

## 2020-05-20 DIAGNOSIS — N138 Other obstructive and reflux uropathy: Secondary | ICD-10-CM

## 2020-05-22 NOTE — Progress Notes (Signed)
05/23/2020 8:45 AM   Gabriel Martin 09-26-1941 [id number]  Referring provider: [name], MD [address] [facility] [place],  [place] [address]  Chief Complaint  Patient presents with  . Follow-up    1 year follow-up, UA, PVR, IPSS    HPI: Patient is 79 year old male who presents today for a 1 year follow-up for BPH with LUTS.  BPH WITH LUTS  (prostate and/or bladder) IPSS score: 15/1   PVR: 0 mL    Previous score: 16/3   Previous PVR: 5 mL  Major complaint(s):  Nocturia x 4-5 off and on for a while.  He states he wakes up for other reasons and then goes to urinate.  The urge to void is not waking him up.  Patient denies any modifying or aggravating factors.  Patient denies any gross hematuria, dysuria or suprapubic/flank pain.  Patient denies any fevers, chills, nausea or vomiting.    His has had a TURP in 02/2013 with Dr. Elnoria Howard for BOO.       IPSS    Row Name 05/23/20 0800         International Prostate Symptom Score   How often have you had the sensation of not emptying your bladder? More than half the time     How often have you had to urinate less than every two hours? About half the time     How often have you found you stopped and started again several times when you urinated? Less than 1 in 5 times     How often have you found it difficult to postpone urination? Less than 1 in 5 times     How often have you had a weak urinary stream? Less than half the time     How often have you had to strain to start urination? Not at All     How many times did you typically get up at night to urinate? 4 Times     Total IPSS Score 15       Quality of Life due to urinary symptoms   If you were to spend the rest of your life with your urinary condition just the way it is now how would you feel about that? Pleased            Score:  1-7 Mild 8-19 Moderate 20-35 Severe    PMH: Past Medical History:  Diagnosis Date  . Acid reflux  05/12/2015  . Anemia   . Arthritis    finger thumb  . Benign enlargement of prostate   . Benign neoplasm of large bowel   . Benign prostatic hyperplasia with urinary obstruction 05/12/2015  . BPH (benign prostatic hyperplasia)   . BPH with obstruction/lower urinary tract symptoms 05/12/2015  . Change in blood platelet count 05/12/2015  . Chronic kidney disease 05/12/2015  . GERD (gastroesophageal reflux disease)   . HLD (hyperlipidemia) 05/12/2015  . Hyperlipidemia   . Nocturia associated with benign prostatic hypertrophy   . Penile ulcer 05/12/2015  . Skin cancer   . Thrombocytopenia [facility])     Surgical History: Past Surgical History:  Procedure Laterality Date  . CATARACT EXTRACTION W/PHACO Left 02/29/2016   Procedure: CATARACT EXTRACTION PHACO AND INTRAOCULAR LENS PLACEMENT ([place]) left;  Surgeon: Leandrew Koyanagi, MD;  Location: [place];  Service: Ophthalmology;  Laterality: Left;  . CATARACT EXTRACTION W/PHACO Right 04/25/2016   Procedure: CATARACT EXTRACTION PHACO AND INTRAOCULAR LENS PLACEMENT ([facility]);  Surgeon: Leandrew Koyanagi, MD;  Location: [place];  Service: Ophthalmology;  Laterality: Right;  . COLONOSCOPY WITH PROPOFOL N/A 07/01/2017   Procedure: COLONOSCOPY WITH PROPOFOL;  Surgeon: Manya Silvas, MD;  [facility] ENDOSCOPY;  Service: Endoscopy;  Laterality: N/A;  . HEMORRHOID SURGERY      Home Medications:  Allergies as of 05/23/2020   No Known Allergies     Medication List       Accurate as of May 23, 2020  8:45 AM. If you have any questions, ask your nurse or doctor.        lovastatin 20 MG tablet Commonly known as: MEVACOR Take 20 mg by mouth at bedtime.   ranitidine 75 MG tablet Commonly known as: ZANTAC Take 150 mg by mouth 2 (two) times daily.       Allergies: No Known Allergies  Family History: Family History  Problem Relation Age of Onset  . Heart disease Brother     Social History:  reports that he quit smoking  about 41 years ago. His smoking use included cigarettes. He has never used smokeless tobacco. He reports current alcohol use of about 10.0 standard drinks of alcohol per week. He reports that he does not use drugs.  ROS: For pertinent review of systems please refer to history of present illness  Physical Exam: BP 138/81   Pulse 72   Ht 5\' 6"  (1.676 m)   Wt 209 lb (94.8 kg)   BMI 33.73 kg/m   Constitutional:  Well nourished. Alert and oriented, No acute distress. HEENT: [place] AT, mask in place  Trachea midline Cardiovascular: No clubbing, cyanosis, or edema. Respiratory: Normal respiratory effort, no increased work of breathing. GI: Abdomen is soft, non tender, non distended, no abdominal masses. Liver and spleen not palpable.  No hernias appreciated.  Stool sample for occult testing is not indicated.   GU: No CVA tenderness.  No bladder fullness or masses.  Patient with uncircumcised phallus.   Foreskin easily retracted  Urethral meatus is patent.  No penile discharge. No penile lesions or rashes. Scrotum without lesions, cysts, rashes and/or edema.  Testicles are located scrotally bilaterally. No masses are appreciated in the testicles. Left and right epididymis are normal. Rectal: Patient with  normal sphincter tone. Anus and perineum without scarring or rashes. No rectal masses are appreciated. Prostate is approximately 60 + grams, could only palpated the apex, no nodules are appreciated. Seminal vesicles could not be palpated Skin: No rashes, bruises or suspicious lesions. Lymph: No inguinal adenopathy. Neurologic: Grossly intact, no focal deficits, moving all 4 extremities. Psychiatric: Normal mood and affect.   Laboratory Data: PSA history  1.2 ng/mL on 05/12/2015  0.7 ng/mL on 06/19/2016 Serum creatinine  1.2 in 01/2020  Urinalysis Component     Latest Ref Rng & Units 05/23/2020  Color, Urine     YELLOW YELLOW  Appearance     CLEAR CLEAR  Specific Gravity, Urine     1.005 -  1.030 1.025  pH     5.0 - 8.0 5.0  Glucose, UA     NEGATIVE mg/dL NEGATIVE  Hgb urine dipstick     NEGATIVE NEGATIVE  Bilirubin Urine     NEGATIVE NEGATIVE  Ketones, ur     NEGATIVE mg/dL NEGATIVE  Protein     NEGATIVE mg/dL 30 (A)  Nitrite     NEGATIVE NEGATIVE  Leukocytes,Ua     NEGATIVE NEGATIVE  Squamous Epithelial / LPF     0 - 5 NONE SEEN  WBC, UA  0 - 5 WBC/hpf 0-5  RBC / HPF     0 - 5 RBC/hpf 0-5  Bacteria, UA     NONE SEEN NONE SEEN  Hyaline Casts, UA      PRESENT   I have reviewed the labs.  Pertinent Imaging Results for Gabriel Martin, Gabriel Martin (MRN [id number]) as of 05/23/2020 08:34  Ref. Range 05/23/2020 08:31  Scan Result Unknown 0 ml     Assessment & Plan:    1. BPH with LUTS IPSS score is 15/1, it is improved Continue conservative management, avoiding bladder irritants and timed voiding's Most bothersome symptoms is/are nocturia RTC in 12 months for IPSS and exam   2. Nocturia Reviewed with the patient that nocturia is often multi-factorial and difficult to treat.  Sleeping disorders, heart conditions, peripheral vascular disease, diabetes, an enlarged prostate for men, an urethral stricture causing bladder outlet obstruction and/or certain medications can contribute to nocturia. I have suggested that the patient avoid caffeine after noon and alcohol in the evening.  He or she may also benefit from fluid restrictions after 6:00 in the evening and voiding just prior to bedtime. Reviewed that research studies have showed that over 84% of patients with sleep apnea reported frequent nighttime urination.   With sleep apnea, oxygen decreases, carbon dioxide increases, the blood become more acidic, the heart rate drops and blood vessels in the lung constrict.  The body is then alerted that something is very wrong. The sleeper must wake enough to reopen the airway. By this time, the heart is racing and experiences a false signal of fluid overload. The heart excretes  a hormone-like protein that tells the body to get rid of sodium and water, resulting in nocturia. Reminded him that a study noted that decreasing sodium intake to 2.3 grams daily, if they don't have issues with hyponatremia, can also reduce the number of nightly voids The patient may benefit from a discussion with his or her primary care physician to see if he or she has risk factors for sleep apnea or other sleep disturbances and obtaining a sleep study - he defers at this time and says "leave it like it is."    Return for IPSS, PVR, UA and exam.  These notes generated with voice recognition software. I apologize for typographical errors.  Zara Council, PA-C  [facility] [address] [place], [place] [address] [phone]

## 2020-05-23 ENCOUNTER — Ambulatory Visit: Payer: PPO | Admitting: Urology

## 2020-05-23 ENCOUNTER — Other Ambulatory Visit
Admission: RE | Admit: 2020-05-23 | Discharge: 2020-05-23 | Disposition: A | Discharge status: Home / Self Care | Payer: PPO | Attending: Urology | Admitting: Urology

## 2020-05-23 ENCOUNTER — Other Ambulatory Visit: Payer: Self-pay

## 2020-05-23 ENCOUNTER — Encounter: Payer: Self-pay | Admitting: Urology

## 2020-05-23 VITALS — BP 138/81 | HR 72 | Ht 66.0 in | Wt 209.0 lb

## 2020-05-23 DIAGNOSIS — N401 Enlarged prostate with lower urinary tract symptoms: Secondary | ICD-10-CM

## 2020-05-23 DIAGNOSIS — R351 Nocturia: Secondary | ICD-10-CM | POA: Diagnosis not present

## 2020-05-23 DIAGNOSIS — N138 Other obstructive and reflux uropathy: Secondary | ICD-10-CM

## 2020-05-23 LAB — URINALYSIS, COMPLETE (UACMP) WITH MICROSCOPIC
Bacteria, UA: NONE SEEN
Bilirubin Urine: NEGATIVE
Glucose, UA: NEGATIVE mg/dL
Hgb urine dipstick: NEGATIVE
Ketones, ur: NEGATIVE mg/dL
Leukocytes,Ua: NEGATIVE
Nitrite: NEGATIVE
Protein, ur: 30 mg/dL — AB
Specific Gravity, Urine: 1.025 (ref 1.005–1.030)
Squamous Epithelial / HPF: NONE SEEN (ref 0–5)
pH: 5 (ref 5.0–8.0)

## 2020-05-23 LAB — BLADDER SCAN AMB NON-IMAGING: Scan Result: 0

## 2020-08-02 DIAGNOSIS — D696 Thrombocytopenia, unspecified: Secondary | ICD-10-CM | POA: Diagnosis not present

## 2020-08-02 DIAGNOSIS — Z6834 Body mass index (BMI) 34.0-34.9, adult: Secondary | ICD-10-CM | POA: Diagnosis not present

## 2020-08-02 DIAGNOSIS — E7800 Pure hypercholesterolemia, unspecified: Secondary | ICD-10-CM | POA: Diagnosis not present

## 2020-08-02 DIAGNOSIS — M79604 Pain in right leg: Secondary | ICD-10-CM | POA: Diagnosis not present

## 2020-08-02 DIAGNOSIS — M79605 Pain in left leg: Secondary | ICD-10-CM | POA: Diagnosis not present

## 2020-08-09 DIAGNOSIS — Z8739 Personal history of other diseases of the musculoskeletal system and connective tissue: Secondary | ICD-10-CM | POA: Diagnosis not present

## 2020-08-09 DIAGNOSIS — N401 Enlarged prostate with lower urinary tract symptoms: Secondary | ICD-10-CM | POA: Diagnosis not present

## 2020-08-09 DIAGNOSIS — Z6834 Body mass index (BMI) 34.0-34.9, adult: Secondary | ICD-10-CM | POA: Diagnosis not present

## 2020-08-09 DIAGNOSIS — E7800 Pure hypercholesterolemia, unspecified: Secondary | ICD-10-CM | POA: Diagnosis not present

## 2020-08-09 DIAGNOSIS — D696 Thrombocytopenia, unspecified: Secondary | ICD-10-CM | POA: Diagnosis not present

## 2020-08-09 DIAGNOSIS — Z23 Encounter for immunization: Secondary | ICD-10-CM | POA: Diagnosis not present

## 2020-08-09 DIAGNOSIS — Z0000 Encounter for general adult medical examination without abnormal findings: Secondary | ICD-10-CM | POA: Diagnosis not present

## 2020-08-09 DIAGNOSIS — N1831 Chronic kidney disease, stage 3a: Secondary | ICD-10-CM | POA: Diagnosis not present

## 2020-08-09 DIAGNOSIS — K219 Gastro-esophageal reflux disease without esophagitis: Secondary | ICD-10-CM | POA: Diagnosis not present

## 2020-11-02 ENCOUNTER — Other Ambulatory Visit: Payer: Self-pay

## 2020-11-02 ENCOUNTER — Inpatient Hospital Stay
Admission: EM | Admit: 2020-11-02 | Discharge: 2020-11-04 | DRG: 064 | Disposition: A | Discharge status: Home / Self Care | Payer: PPO | Attending: Internal Medicine | Admitting: Internal Medicine

## 2020-11-02 ENCOUNTER — Observation Stay: Payer: PPO

## 2020-11-02 ENCOUNTER — Emergency Department: Payer: PPO

## 2020-11-02 ENCOUNTER — Encounter: Payer: Self-pay | Admitting: Intensive Care

## 2020-11-02 DIAGNOSIS — I6340 Cerebral infarction due to embolism of unspecified cerebral artery: Secondary | ICD-10-CM | POA: Diagnosis present

## 2020-11-02 DIAGNOSIS — N401 Enlarged prostate with lower urinary tract symptoms: Secondary | ICD-10-CM | POA: Diagnosis present

## 2020-11-02 DIAGNOSIS — I63512 Cerebral infarction due to unspecified occlusion or stenosis of left middle cerebral artery: Principal | ICD-10-CM | POA: Diagnosis present

## 2020-11-02 DIAGNOSIS — D696 Thrombocytopenia, unspecified: Secondary | ICD-10-CM | POA: Diagnosis present

## 2020-11-02 DIAGNOSIS — Z79899 Other long term (current) drug therapy: Secondary | ICD-10-CM

## 2020-11-02 DIAGNOSIS — Z9841 Cataract extraction status, right eye: Secondary | ICD-10-CM

## 2020-11-02 DIAGNOSIS — I361 Nonrheumatic tricuspid (valve) insufficiency: Secondary | ICD-10-CM | POA: Diagnosis not present

## 2020-11-02 DIAGNOSIS — E785 Hyperlipidemia, unspecified: Secondary | ICD-10-CM | POA: Diagnosis present

## 2020-11-02 DIAGNOSIS — R29701 NIHSS score 1: Secondary | ICD-10-CM | POA: Diagnosis not present

## 2020-11-02 DIAGNOSIS — S0101XA Laceration without foreign body of scalp, initial encounter: Secondary | ICD-10-CM | POA: Diagnosis not present

## 2020-11-02 DIAGNOSIS — I5023 Acute on chronic systolic (congestive) heart failure: Secondary | ICD-10-CM | POA: Diagnosis not present

## 2020-11-02 DIAGNOSIS — Z85828 Personal history of other malignant neoplasm of skin: Secondary | ICD-10-CM

## 2020-11-02 DIAGNOSIS — I517 Cardiomegaly: Secondary | ICD-10-CM | POA: Diagnosis not present

## 2020-11-02 DIAGNOSIS — R778 Other specified abnormalities of plasma proteins: Secondary | ICD-10-CM | POA: Diagnosis present

## 2020-11-02 DIAGNOSIS — S199XXA Unspecified injury of neck, initial encounter: Secondary | ICD-10-CM | POA: Diagnosis not present

## 2020-11-02 DIAGNOSIS — G319 Degenerative disease of nervous system, unspecified: Secondary | ICD-10-CM | POA: Diagnosis not present

## 2020-11-02 DIAGNOSIS — Z961 Presence of intraocular lens: Secondary | ICD-10-CM | POA: Diagnosis present

## 2020-11-02 DIAGNOSIS — R600 Localized edema: Secondary | ICD-10-CM | POA: Diagnosis not present

## 2020-11-02 DIAGNOSIS — S60512A Abrasion of left hand, initial encounter: Secondary | ICD-10-CM | POA: Diagnosis present

## 2020-11-02 DIAGNOSIS — K219 Gastro-esophageal reflux disease without esophagitis: Secondary | ICD-10-CM | POA: Diagnosis not present

## 2020-11-02 DIAGNOSIS — W19XXXA Unspecified fall, initial encounter: Secondary | ICD-10-CM | POA: Diagnosis present

## 2020-11-02 DIAGNOSIS — N400 Enlarged prostate without lower urinary tract symptoms: Secondary | ICD-10-CM | POA: Diagnosis not present

## 2020-11-02 DIAGNOSIS — Z87891 Personal history of nicotine dependence: Secondary | ICD-10-CM | POA: Diagnosis not present

## 2020-11-02 DIAGNOSIS — N182 Chronic kidney disease, stage 2 (mild): Secondary | ICD-10-CM | POA: Diagnosis not present

## 2020-11-02 DIAGNOSIS — Z20822 Contact with and (suspected) exposure to covid-19: Secondary | ICD-10-CM | POA: Diagnosis present

## 2020-11-02 DIAGNOSIS — R7989 Other specified abnormal findings of blood chemistry: Secondary | ICD-10-CM | POA: Diagnosis present

## 2020-11-02 DIAGNOSIS — I429 Cardiomyopathy, unspecified: Secondary | ICD-10-CM | POA: Diagnosis not present

## 2020-11-02 DIAGNOSIS — R29818 Other symptoms and signs involving the nervous system: Secondary | ICD-10-CM | POA: Diagnosis not present

## 2020-11-02 DIAGNOSIS — Z8249 Family history of ischemic heart disease and other diseases of the circulatory system: Secondary | ICD-10-CM | POA: Diagnosis not present

## 2020-11-02 DIAGNOSIS — I493 Ventricular premature depolarization: Secondary | ICD-10-CM | POA: Diagnosis present

## 2020-11-02 DIAGNOSIS — R55 Syncope and collapse: Secondary | ICD-10-CM | POA: Diagnosis present

## 2020-11-02 DIAGNOSIS — N183 Chronic kidney disease, stage 3 unspecified: Secondary | ICD-10-CM | POA: Diagnosis present

## 2020-11-02 DIAGNOSIS — J3489 Other specified disorders of nose and nasal sinuses: Secondary | ICD-10-CM | POA: Diagnosis not present

## 2020-11-02 DIAGNOSIS — I130 Hypertensive heart and chronic kidney disease with heart failure and stage 1 through stage 4 chronic kidney disease, or unspecified chronic kidney disease: Secondary | ICD-10-CM | POA: Diagnosis present

## 2020-11-02 DIAGNOSIS — Z9842 Cataract extraction status, left eye: Secondary | ICD-10-CM

## 2020-11-02 DIAGNOSIS — N1831 Chronic kidney disease, stage 3a: Secondary | ICD-10-CM | POA: Diagnosis not present

## 2020-11-02 DIAGNOSIS — S069X9A Unspecified intracranial injury with loss of consciousness of unspecified duration, initial encounter: Secondary | ICD-10-CM | POA: Diagnosis not present

## 2020-11-02 DIAGNOSIS — I351 Nonrheumatic aortic (valve) insufficiency: Secondary | ICD-10-CM | POA: Diagnosis not present

## 2020-11-02 DIAGNOSIS — G629 Polyneuropathy, unspecified: Secondary | ICD-10-CM | POA: Diagnosis not present

## 2020-11-02 DIAGNOSIS — I6381 Other cerebral infarction due to occlusion or stenosis of small artery: Secondary | ICD-10-CM | POA: Diagnosis not present

## 2020-11-02 DIAGNOSIS — I6523 Occlusion and stenosis of bilateral carotid arteries: Secondary | ICD-10-CM | POA: Diagnosis not present

## 2020-11-02 DIAGNOSIS — I639 Cerebral infarction, unspecified: Secondary | ICD-10-CM

## 2020-11-02 LAB — CBC
HCT: 43.8 % (ref 39.0–52.0)
Hemoglobin: 14.3 g/dL (ref 13.0–17.0)
MCH: 32.3 pg (ref 26.0–34.0)
MCHC: 32.6 g/dL (ref 30.0–36.0)
MCV: 98.9 fL (ref 80.0–100.0)
Platelets: 99 10*3/uL — ABNORMAL LOW (ref 150–400)
RBC: 4.43 MIL/uL (ref 4.22–5.81)
RDW: 12.5 % (ref 11.5–15.5)
WBC: 6.8 10*3/uL (ref 4.0–10.5)
nRBC: 0 % (ref 0.0–0.2)

## 2020-11-02 LAB — URINALYSIS, COMPLETE (UACMP) WITH MICROSCOPIC
Bacteria, UA: NONE SEEN
Bilirubin Urine: NEGATIVE
Glucose, UA: NEGATIVE mg/dL
Ketones, ur: NEGATIVE mg/dL
Leukocytes,Ua: NEGATIVE
Nitrite: NEGATIVE
Protein, ur: 30 mg/dL — AB
Specific Gravity, Urine: 1.016 (ref 1.005–1.030)
Squamous Epithelial / HPF: NONE SEEN (ref 0–5)
pH: 5 (ref 5.0–8.0)

## 2020-11-02 LAB — MAGNESIUM: Magnesium: 2.1 mg/dL (ref 1.7–2.4)

## 2020-11-02 LAB — BASIC METABOLIC PANEL
Anion gap: 10 (ref 5–15)
BUN: 26 mg/dL — ABNORMAL HIGH (ref 8–23)
CO2: 24 mmol/L (ref 22–32)
Calcium: 8.9 mg/dL (ref 8.9–10.3)
Chloride: 106 mmol/L (ref 98–111)
Creatinine, Ser: 1.38 mg/dL — ABNORMAL HIGH (ref 0.61–1.24)
GFR, Estimated: 52 mL/min — ABNORMAL LOW (ref >60)
Glucose, Bld: 105 mg/dL — ABNORMAL HIGH (ref 70–99)
Potassium: 4.1 mmol/L (ref 3.5–5.1)
Sodium: 140 mmol/L (ref 135–145)

## 2020-11-02 LAB — TROPONIN I (HIGH SENSITIVITY)
Troponin I (High Sensitivity): 27 ng/L — ABNORMAL HIGH (ref <18)
Troponin I (High Sensitivity): 28 ng/L — ABNORMAL HIGH (ref <18)

## 2020-11-02 LAB — RESP PANEL BY RT-PCR (FLU A&B, COVID) ARPGX2
Influenza A by PCR: NEGATIVE
Influenza B by PCR: NEGATIVE
SARS Coronavirus 2 by RT PCR: NEGATIVE

## 2020-11-02 IMAGING — CT CT HEAD W/O CM
3 series · 15 of 47 positions shown, 18 images · non-contrast
Comparison: None.

CLINICAL DATA: Head trauma, loss of consciousness. Laceration to
right back side of head.

EXAM:
CT HEAD WITHOUT CONTRAST
CT CERVICAL SPINE WITHOUT CONTRAST
TECHNIQUE: Multidetector CT imaging of the head and cervical spine was
performed following the standard protocol without intravenous
contrast. Multiplanar CT image reconstructions of the cervical spine
were also generated.

[Series 3: head wo · axial · 0.43mm/px · z∈[-142,-17]mm · 9 of 30 slices shown, 12 images]
[im 3/30  brain]
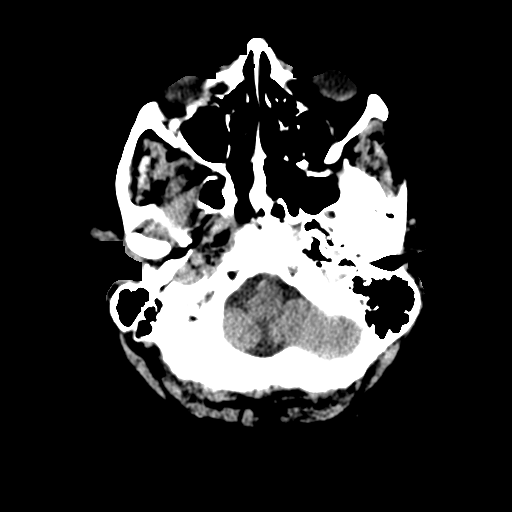
[im 3/30  bone]
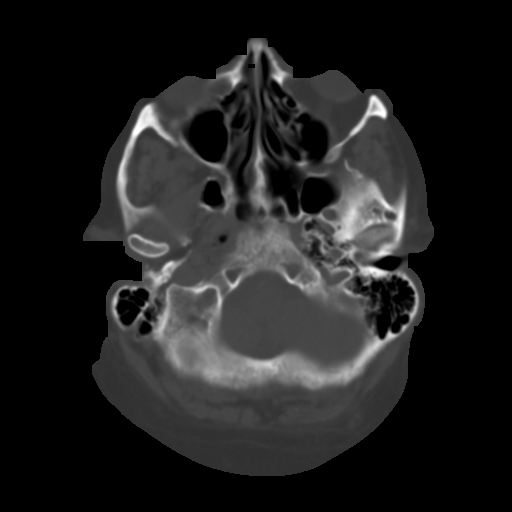
[im 6/30  brain]
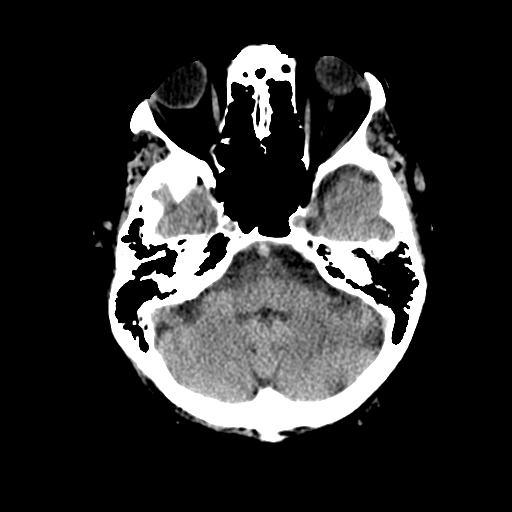
[im 9/30  brain]
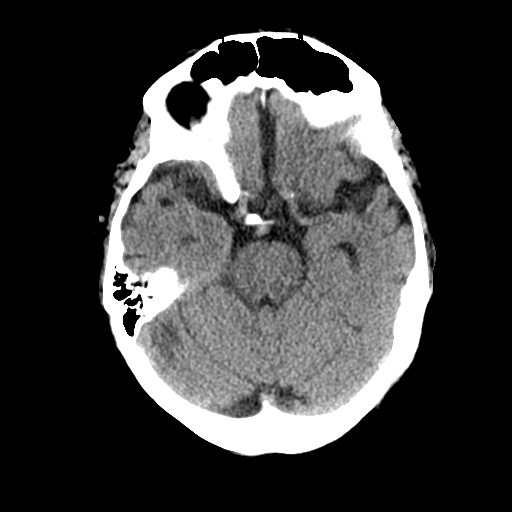
[im 12/30  brain]
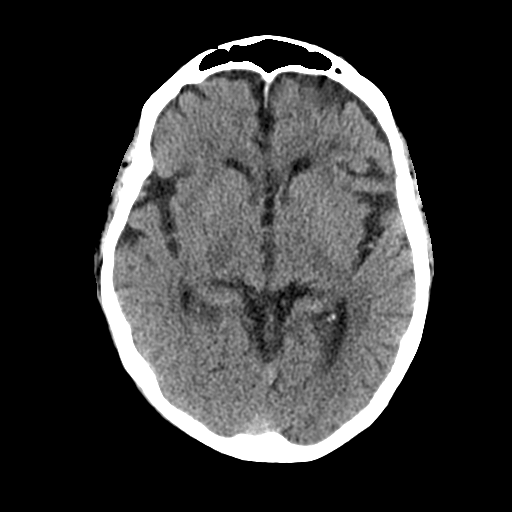
[im 16/30  brain]
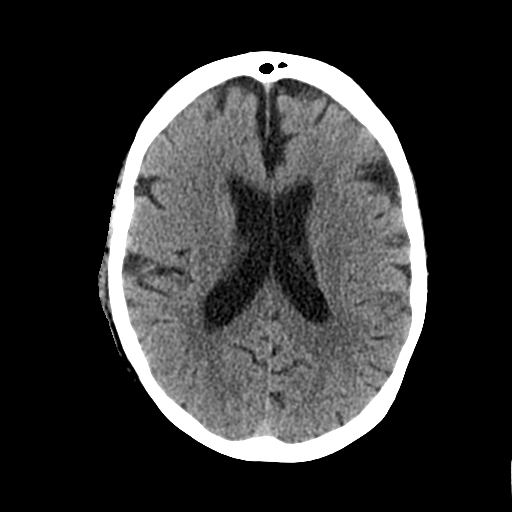
[im 16/30  bone]
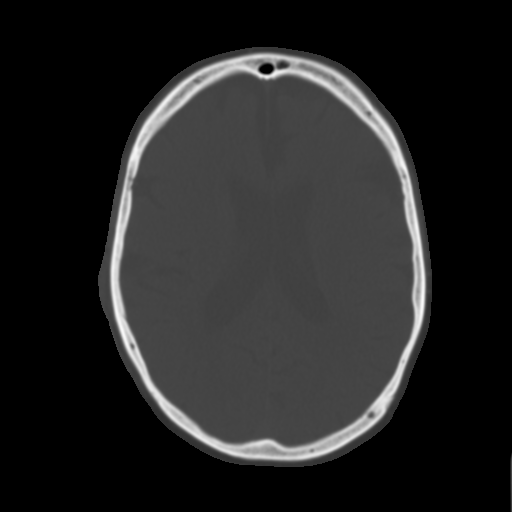
[im 19/30  brain]
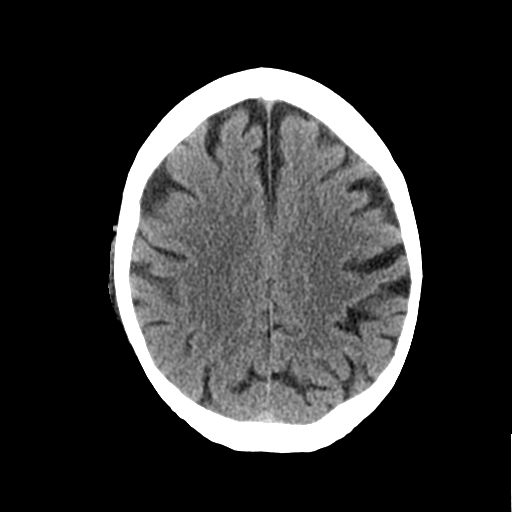
[im 22/30  brain]
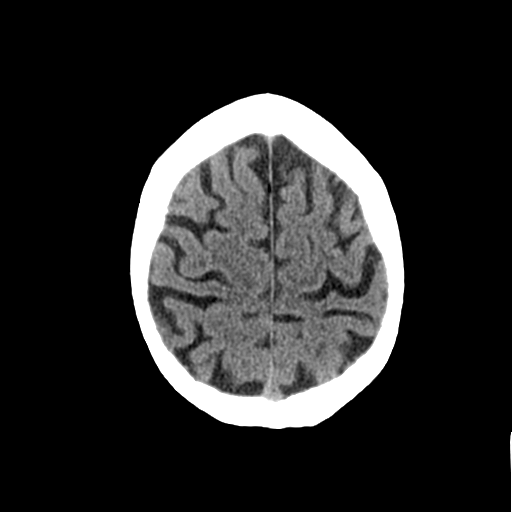
[im 25/30  brain]
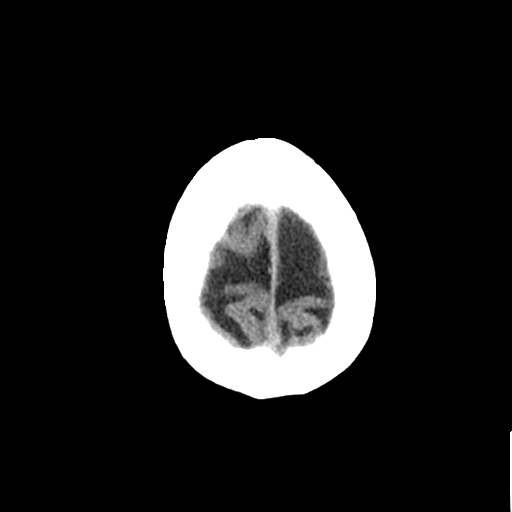
[im 28/30  brain]
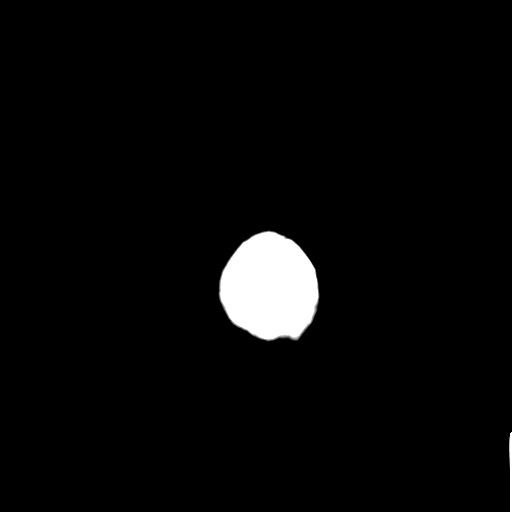
[im 28/30  bone]
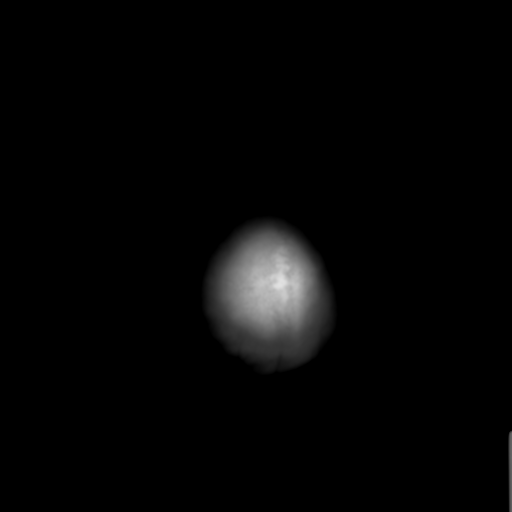

[Series 4: coronal soft tissue · coronal · 0.31mm/px · 3 of 66 slices shown]
[im 22/66  brain]
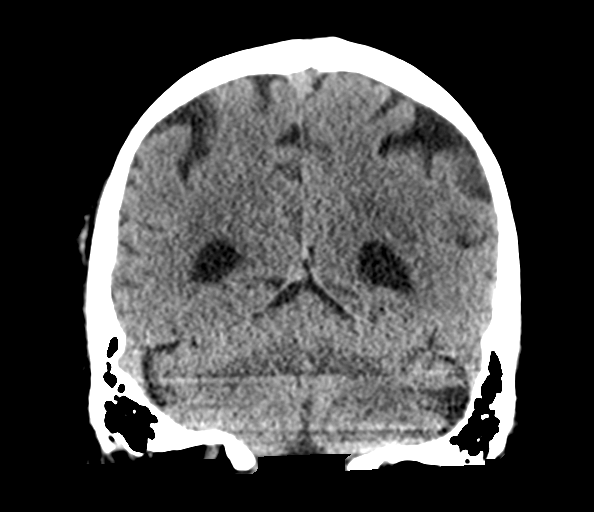
[im 29/66  brain]
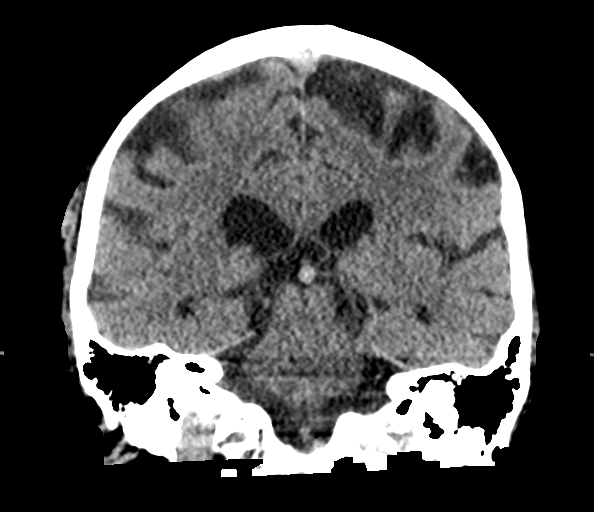
[im 37/66  brain]
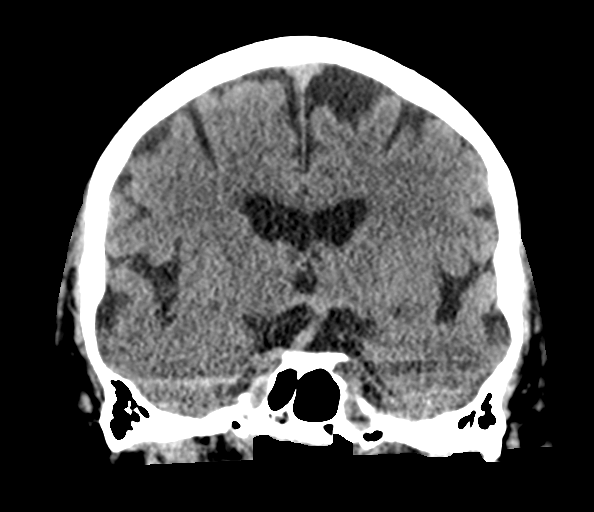

[Series 5: sagittal soft tissue · sagittal · 0.31mm/px · 3 of 54 slices shown]
[im 18/54  brain]
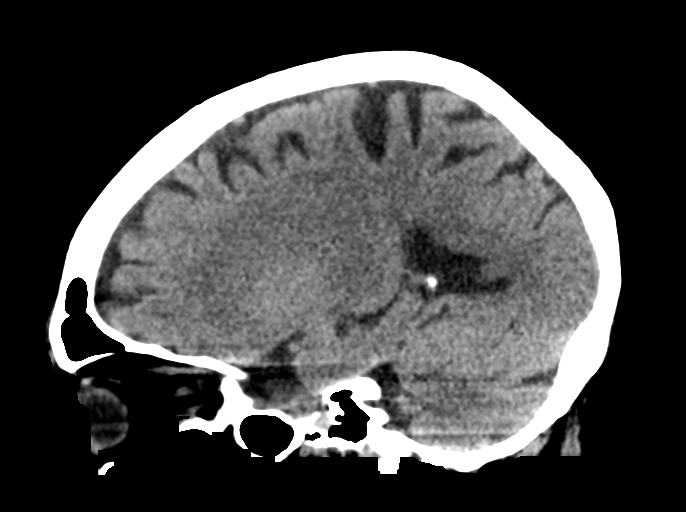
[im 27/54  brain]
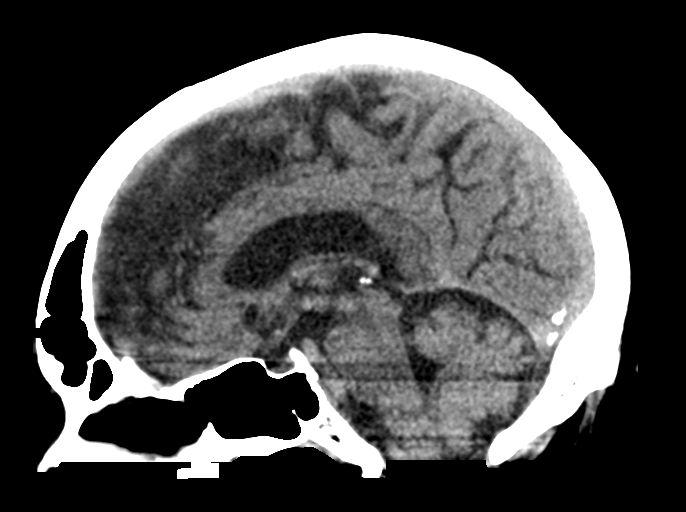
[im 36/54  brain]
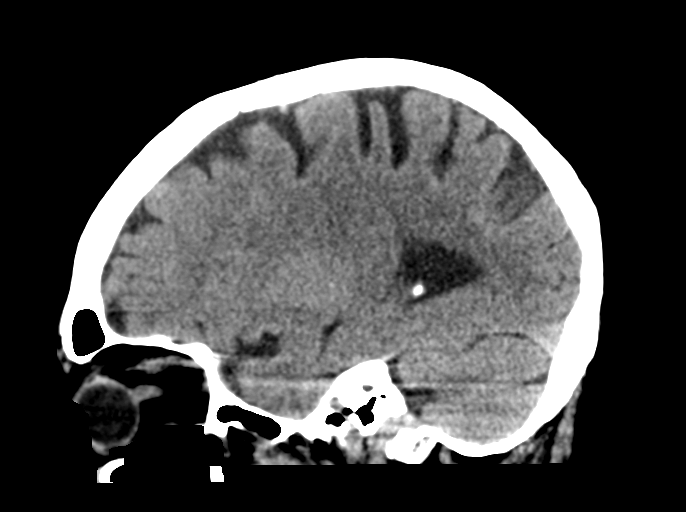

[15 of 47 positions shown; findings below may reference images not displayed]

FINDINGS: CT HEAD FINDINGS

Brain: No evidence of large-territorial acute infarction. No
parenchymal hemorrhage. No mass lesion. No extra-axial collection.

No mass effect or midline shift. No hydrocephalus. Basilar cisterns
are patent.

Vascular: No hyperdense vessel.

Skull: No acute fracture or focal lesion.

Sinuses/Orbits: Paranasal sinuses and mastoid air cells are clear.
The orbits are unremarkable.

Other: Mild right scalp subcutaneus soft tissue edema.

CT CERVICAL SPINE FINDINGS

Alignment: Normal.

Skull base and vertebrae: No acute fracture. Multilevel mild
moderate degenerative changes of the spine. No aggressive appearing
focal osseous lesion or focal pathologic process.

Soft tissues and spinal canal: No prevertebral fluid or swelling. No
visible canal hematoma.

Disc levels:  Mild intervertebral disc space narrowing.

Upper chest: Unremarkable.

Other: None.
IMPRESSION: 1. No acute intracranial abnormality.
2. No acute displaced fracture or traumatic listhesis of the
cervical spine.

## 2020-11-02 IMAGING — DX DG CHEST 1V PORT
1 series · 1 of 1 positions shown · non-contrast
Comparison: None.

CLINICAL DATA: Syncopal episode

EXAM:
PORTABLE CHEST 1 VIEW

[chest ap]
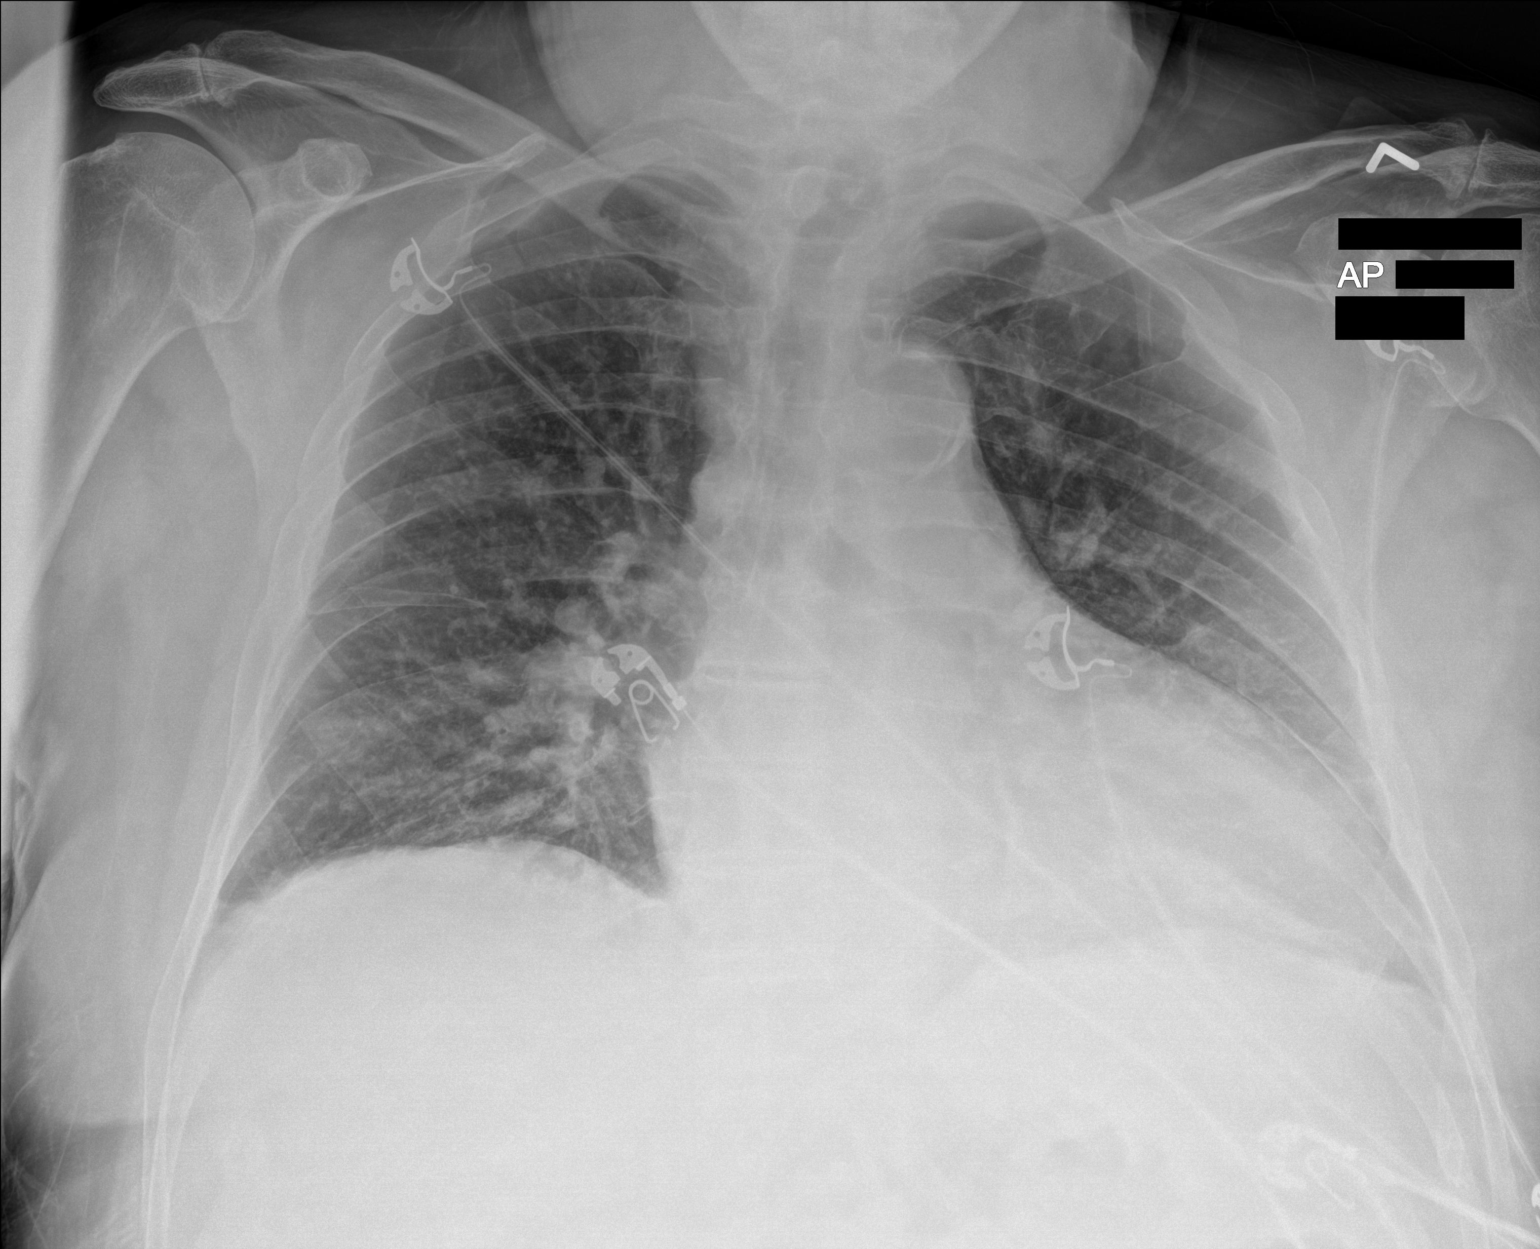

[1 of 1 positions shown; findings below may reference images not displayed]

FINDINGS: Cardiomegaly without overt pulmonary edema. No pleural effusion.
Aortic atherosclerosis. No pneumothorax.
IMPRESSION: Cardiomegaly.

## 2020-11-02 IMAGING — CT CT CERVICAL SPINE W/O CM
3 of 4 series · 13 of 33 positions shown, 16 images · non-contrast
Comparison: None.

CLINICAL DATA: Head trauma, loss of consciousness. Laceration to
right back side of head.

EXAM:
CT HEAD WITHOUT CONTRAST
CT CERVICAL SPINE WITHOUT CONTRAST
TECHNIQUE: Multidetector CT imaging of the head and cervical spine was
performed following the standard protocol without intravenous
contrast. Multiplanar CT image reconstructions of the cervical spine
were also generated.

[Series 4: sagittal bone · sagittal · 0.28mm/px · 5 of 67 slices shown, 6 images]
[im 23/67  bone]
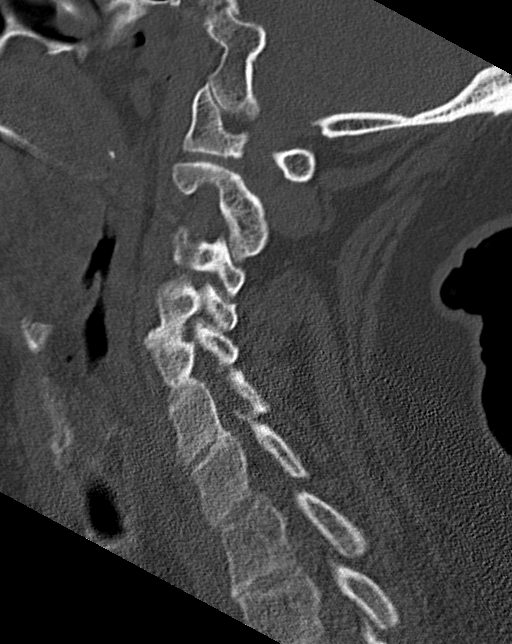
[im 28/67  bone]
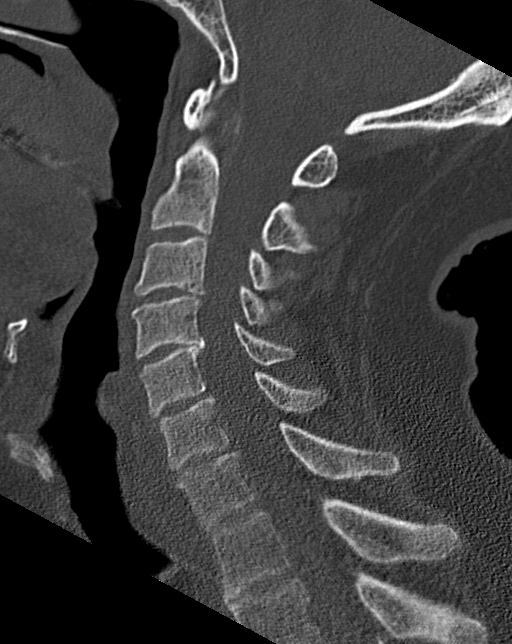
[im 34/67  soft-tissue]
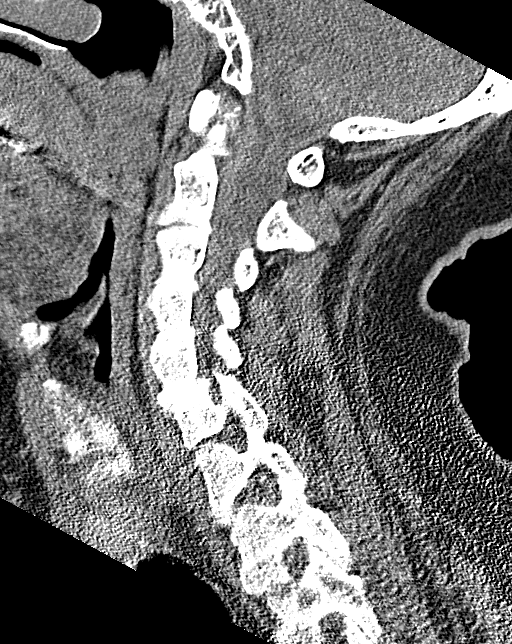
[im 34/67  bone]
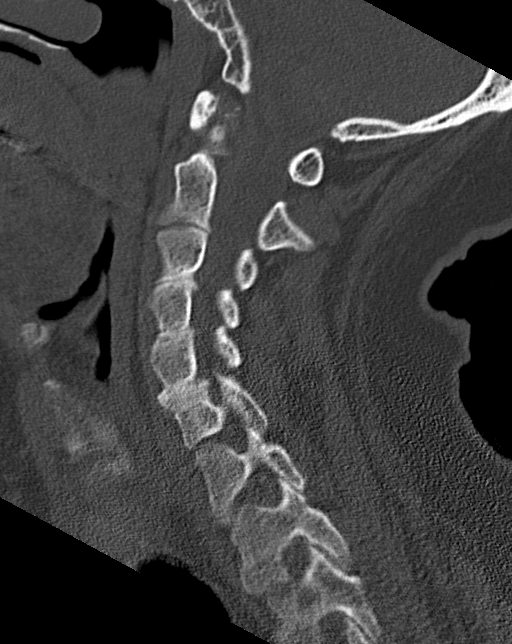
[im 39/67  bone]
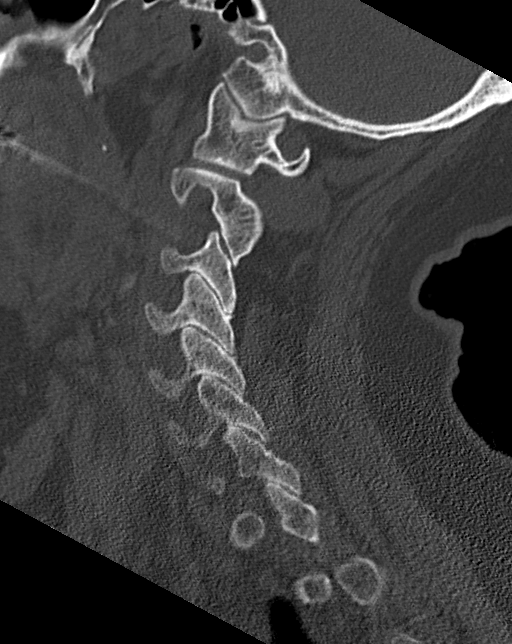
[im 45/67  bone]
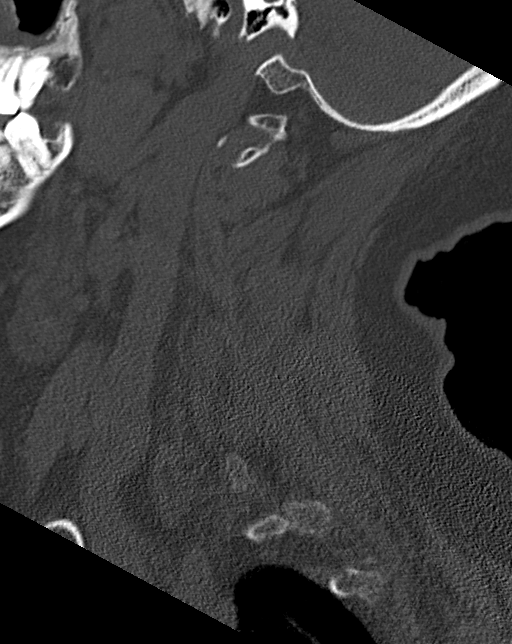

[Series 5: coronal bone · coronal · 0.27mm/px · 3 of 57 slices shown]
[im 13/57  bone]
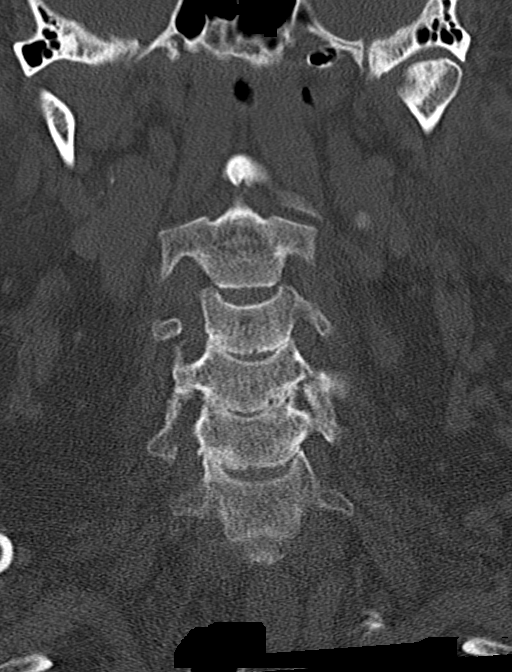
[im 23/57  bone]
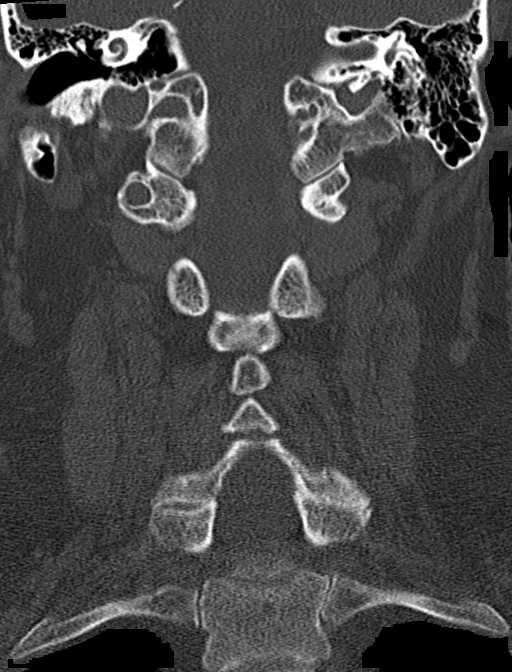
[im 34/57  bone]
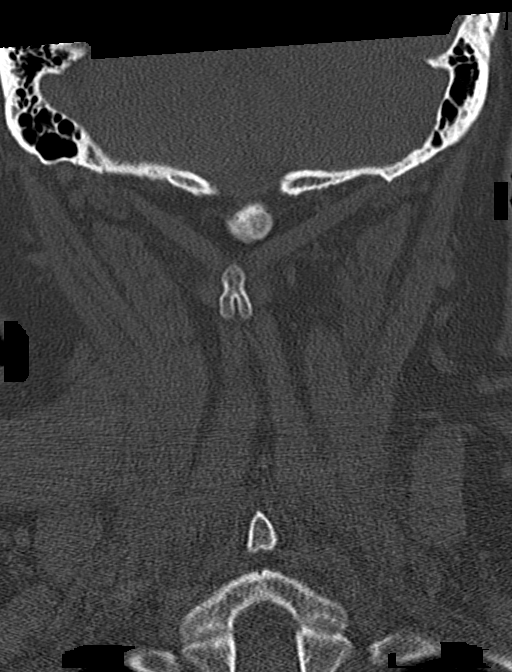

[Series 6: orthogonal bone · axial · 0.28mm/px · z∈[-296,-200]mm · 5 of 91 slices shown, 7 images]
[im 16/91  soft-tissue]
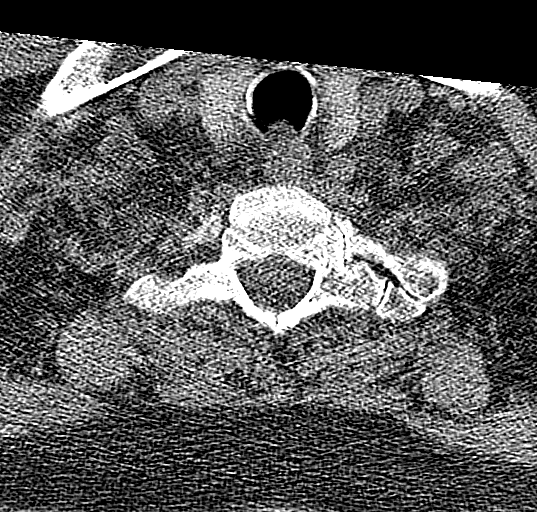
[im 16/91  bone]
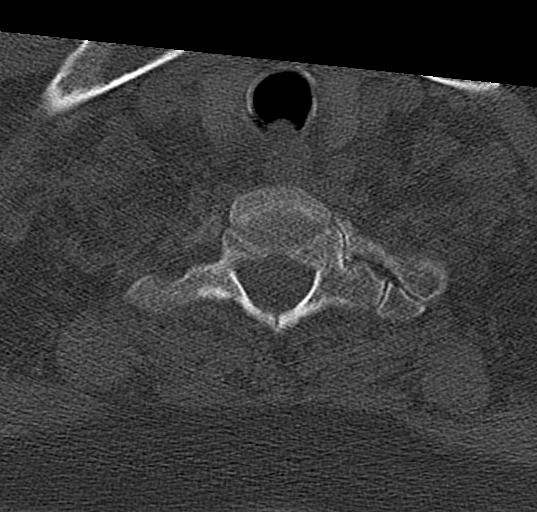
[im 31/91  bone]
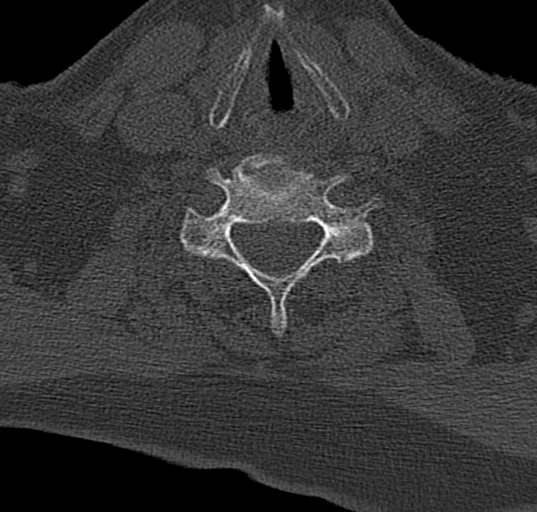
[im 46/91  bone]
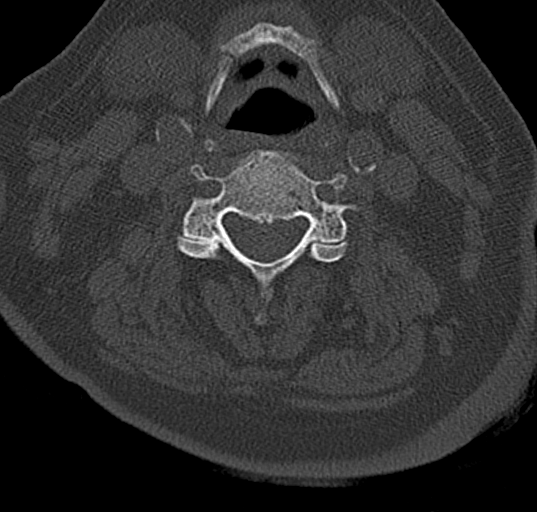
[im 61/91  bone]
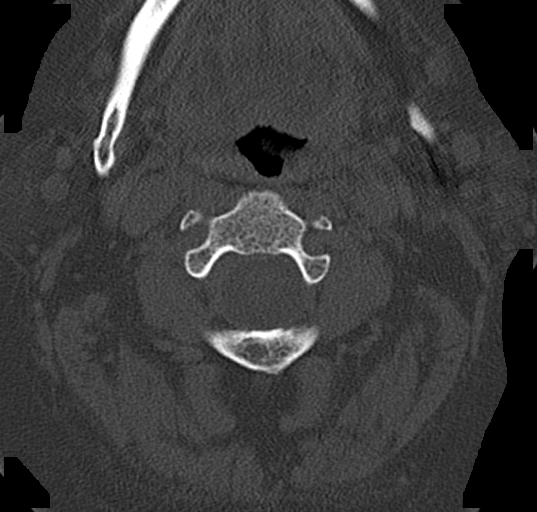
[im 76/91  soft-tissue]
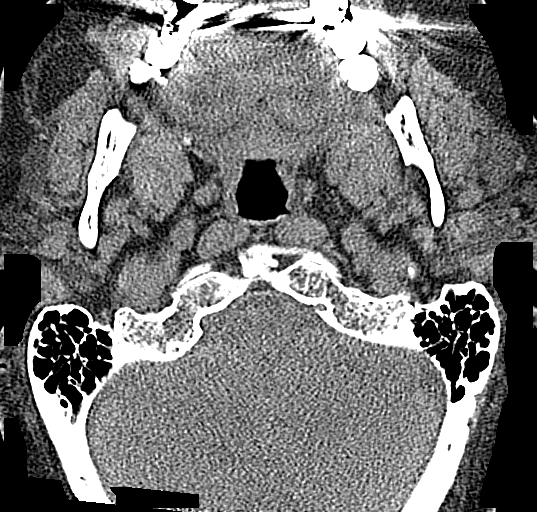
[im 76/91  bone]
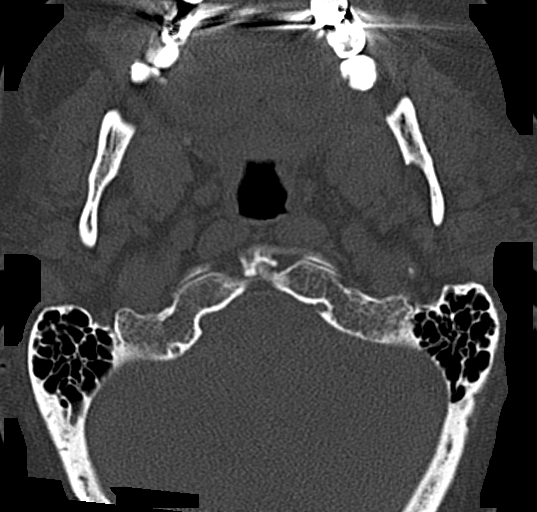

[13 of 33 positions shown; findings below may reference images not displayed]

FINDINGS: CT HEAD FINDINGS

Brain: No evidence of large-territorial acute infarction. No
parenchymal hemorrhage. No mass lesion. No extra-axial collection.

No mass effect or midline shift. No hydrocephalus. Basilar cisterns
are patent.

Vascular: No hyperdense vessel.

Skull: No acute fracture or focal lesion.

Sinuses/Orbits: Paranasal sinuses and mastoid air cells are clear.
The orbits are unremarkable.

Other: Mild right scalp subcutaneus soft tissue edema.

CT CERVICAL SPINE FINDINGS

Alignment: Normal.

Skull base and vertebrae: No acute fracture. Multilevel mild
moderate degenerative changes of the spine. No aggressive appearing
focal osseous lesion or focal pathologic process.

Soft tissues and spinal canal: No prevertebral fluid or swelling. No
visible canal hematoma.

Disc levels:  Mild intervertebral disc space narrowing.

Upper chest: Unremarkable.

Other: None.
IMPRESSION: 1. No acute intracranial abnormality.
2. No acute displaced fracture or traumatic listhesis of the
cervical spine.

## 2020-11-02 MED ORDER — ACETAMINOPHEN 650 MG RE SUPP: 650.0000 mg | Freq: Four times a day (QID) | RECTAL | Status: DC | PRN

## 2020-11-02 MED ORDER — ACETAMINOPHEN 325 MG PO TABS
650.0000 mg | ORAL_TABLET | Freq: Four times a day (QID) | ORAL | Status: DC | PRN
  Administered 2020-11-03: 650 mg via ORAL
  Filled 2020-11-02: qty 2

## 2020-11-02 MED ORDER — ACETAMINOPHEN 500 MG PO TABS
1000.0000 mg | ORAL_TABLET | Freq: Once | ORAL | Status: AC
  Administered 2020-11-02: 1000 mg via ORAL
  Filled 2020-11-02: qty 2

## 2020-11-02 MED ORDER — PRAVASTATIN SODIUM 20 MG PO TABS
20.0000 mg | ORAL_TABLET | Freq: Every day | ORAL | Status: DC
  Administered 2020-11-02 – 2020-11-03 (×2): 20 mg via ORAL
  Filled 2020-11-02 (×3): qty 1

## 2020-11-02 NOTE — H&P (Signed)
History and Physical    PLEASE NOTE THAT DRAGON DICTATION SOFTWARE WAS USED IN THE CONSTRUCTION OF THIS NOTE.   Gabriel Martin UKG:[id number] DOB: 04-08-1941 DOA: 11/02/2020  PCP: [name], MD Patient coming from: home   I have personally briefly reviewed patient's old medical records in [place]  Chief Complaint: Loss of consciousness  HPI: Gabriel Martin is a 79 y.o. male with medical history significant for hyperlipidemia, stage III chronic kidney disease, BPH, who is admitted to [facility] on 11/02/2020 with syncope after presenting from home to [facility] Emergency Department complaining of a single episode of loss of consciousness.   Earlier today, after [employer] at [employer] and while attempting to ambulate back to his car, reportedly lost consciousness, falling backwards and hitting the back of his head on the side. Follows witnessed, and consciousness was regained within seconds. No evidence of associated loss of bowel or bladder function or tongue biting, and no evidence of associated tonic-clonic activity. The patient denies any preceding paresthesias or diaphoresis. He also denies any recent chest pain, shortness of breath, nausea, vomiting. Subsequently, he reports no additional syncopal episodes, and denies any sensation of presyncope. No preceding recent trauma. Subsequent to this syncopal episode, the patient reports mild posterior headache in the absence of any change in vision. Denies any acute focal weakness or any acute focal paresthesias/numbness. Denies any associated dysarthria, slurred speech, facial droop vertigo, or dysphagia.   Denies any recent travel, periods of prolonged admission to [employer] duty, underlying malignancy, personal history of DVT/PE.   Denies any recent subjective fever, chills, rigors, generalized myalgias. Rhinitis, rhinorrhea, sore throat, abdominal pain, diarrhea, denies any recent  dysuria, gross hematuria, or change in urinary urgency/frequency. Not on any blood thinners as an outpatient.     ED Course:  Vital signs in the ED were notable for the following: Temperature max 98.1; heart rate 80; blood pressure 145/91; respiratory rate 16-19; oxygen saturation 94 to 98% on room air.  Labs were notable for the following: BMP was notable for the following: Sodium 140, potassium four-point, bicarbonate 24, BUN 26, creatinine 1.3 with no prior creatinine data point available in the EMR for point comparison, including unavailable through care everywhere, glucose 105. CBC notable for the following: White blood cell count 6.8, hemoglobin 14.3, and platelets 99, without any prior platelet data point available in the EMR for point comparison. High-sensitivity troponin I x1 found to be 27, with no prior value available for comparison. Urinalysis has been ordered, with result currently pending. Screening COVID-19/influenza PCR performed in the ED this evening, and found to be negative.  Noncontrast CT of the head showed no evidence of acute intracranial process, including no evidence of acute intracranial hemorrhage or acute ischemic infarct, but showed evidence of mild right scalp subcutaneous soft tissue edema in the absence of associated hematoma. CT cervical spine showed no evidence of acute displaced fracture or traumatic listhesis of the cervical spine. EKG showed sinus rhythm with single PVC, heart rate 74, QTc 448, and no evidence of T wave or ST changes, including no evidence of ST elevation.  While in the ED, the following were administered: Acetaminophen 1 g p.o. x1.    Review of Systems: As per HPI otherwise 10 point review of systems negative.   Past Medical History:  Diagnosis Date  . Acid reflux 05/12/2015  . Anemia   . Arthritis    finger thumb  . Benign enlargement of prostate   .  Benign neoplasm of large bowel   . Benign prostatic hyperplasia with urinary  obstruction 05/12/2015  . BPH (benign prostatic hyperplasia)   . BPH with obstruction/lower urinary tract symptoms 05/12/2015  . Change in blood platelet count 05/12/2015  . Chronic kidney disease 05/12/2015  . GERD (gastroesophageal reflux disease)   . HLD (hyperlipidemia) 05/12/2015  . Hyperlipidemia   . Nocturia associated with benign prostatic hypertrophy   . Penile ulcer 05/12/2015  . Skin cancer   . Thrombocytopenia ([place])     Past Surgical History:  Procedure Laterality Date  . CATARACT EXTRACTION W/PHACO Left 02/29/2016   Procedure: CATARACT EXTRACTION PHACO AND INTRAOCULAR LENS PLACEMENT ([place]) left;  Surgeon: Leandrew Koyanagi, MD;  Location: [place];  Service: Ophthalmology;  Laterality: Left;  . CATARACT EXTRACTION W/PHACO Right 04/25/2016   Procedure: CATARACT EXTRACTION PHACO AND INTRAOCULAR LENS PLACEMENT ([facility]);  Surgeon: Leandrew Koyanagi, MD;  Location: [place];  Service: Ophthalmology;  Laterality: Right;  . COLONOSCOPY WITH PROPOFOL N/A 07/01/2017   Procedure: COLONOSCOPY WITH PROPOFOL;  Surgeon: Manya Silvas, MD;  Location: [facility] ENDOSCOPY;  Service: Endoscopy;  Laterality: N/A;  . HEMORRHOID SURGERY      Social History:  reports that he quit smoking about 41 years ago. His smoking use included cigarettes. He has never used smokeless tobacco. He reports current alcohol use of about 6.0 standard drinks of alcohol per week. He reports that he does not use drugs.   No Known Allergies  Family History  Problem Relation Age of Onset  . Heart disease Brother      Prior to Admission medications   Medication Sig Start Date End Date Taking? Authorizing Provider  lovastatin (MEVACOR) 20 MG tablet Take 20 mg by mouth at bedtime.    [provider]  ranitidine (ZANTAC) 75 MG tablet Take 150 mg by mouth 2 (two) times daily.     [provider]     Objective    Physical Exam: Vitals:   11/02/20 1848 11/02/20 1930  BP: (!)  163/70 (!) 145/91  Pulse: 80 80  Resp: 16 19  Temp: 98.1 F (36.7 C)   TempSrc: Oral   SpO2: 94% 98%  Weight: 90.7 kg   Height: 5\' 6"  (1.676 m)     General: appears to be stated age; alert, oriented Skin: warm, dry, no rash Head:  Small posterior lac with hemostasis noted, otherwise AT Mouth:  Oral mucosa membranes appear moist, normal dentition Neck: supple; trachea midline Heart:  RRR; did not appreciate any M/R/G Lungs: CTAB, did not appreciate any wheezes, rales, or rhonchi Abdomen: + BS; soft, ND, NT Vascular: 2+ pedal pulses b/l; 2+ radial pulses b/l Extremities: no peripheral edema, no muscle wasting Neuro: strength and sensation intact in upper and lower extremities b/l   Labs on Admission: I have personally reviewed following labs and imaging studies  CBC: Recent Labs  Lab 11/02/20 1908  WBC 6.8  HGB 14.3  HCT 43.8  MCV 98.9  PLT 99*   Basic Metabolic Panel: Recent Labs  Lab 11/02/20 1908  NA 140  K 4.1  CL 106  CO2 24  GLUCOSE 105*  BUN 26*  CREATININE 1.38*  CALCIUM 8.9   GFR: Estimated Creatinine Clearance: 45 mL/min (A) (by C-G formula based on SCr of 1.38 mg/dL (H)). Liver Function Tests: No results for input(s): AST, ALT, ALKPHOS, BILITOT, PROT, ALBUMIN in the last 168 hours. No results for input(s): LIPASE, AMYLASE in the last 168 hours. No results for  input(s): AMMONIA in the last 168 hours. Coagulation Profile: No results for input(s): INR, PROTIME in the last 168 hours. Cardiac Enzymes: No results for input(s): CKTOTAL, CKMB, CKMBINDEX, TROPONINI in the last 168 hours. BNP (last 3 results) No results for input(s): PROBNP in the last 8760 hours. HbA1C: No results for input(s): HGBA1C in the last 72 hours. CBG: No results for input(s): GLUCAP in the last 168 hours. Lipid Profile: No results for input(s): CHOL, HDL, LDLCALC, TRIG, CHOLHDL, LDLDIRECT in the last 72 hours. Thyroid Function Tests: No results for input(s): TSH, T4TOTAL,  FREET4, T3FREE, THYROIDAB in the last 72 hours. Anemia Panel: No results for input(s): VITAMINB12, FOLATE, FERRITIN, TIBC, IRON, RETICCTPCT in the last 72 hours. Urine analysis:    Component Value Date/Time   COLORURINE YELLOW 05/23/2020 0757   APPEARANCEUR CLEAR 05/23/2020 0757   LABSPEC 1.025 05/23/2020 0757   PHURINE 5.0 05/23/2020 0757   GLUCOSEU NEGATIVE 05/23/2020 0757   HGBUR NEGATIVE 05/23/2020 0757   [place] NEGATIVE 05/23/2020 [place] 05/23/2020 0757   PROTEINUR 30 (A) 05/23/2020 0757   NITRITE NEGATIVE 05/23/2020 0757   LEUKOCYTESUR NEGATIVE 05/23/2020 0757    Radiological Exams on Admission: CT HEAD WO CONTRAST  Result Date: 11/02/2020 CLINICAL DATA:  Head trauma, loss of consciousness. Laceration to right back side of head. EXAM: CT HEAD WITHOUT CONTRAST CT CERVICAL SPINE WITHOUT CONTRAST TECHNIQUE: Multidetector CT imaging of the head and cervical spine was performed following the standard protocol without intravenous contrast. Multiplanar CT image reconstructions of the cervical spine were also generated. COMPARISON:  None. FINDINGS: CT HEAD FINDINGS Brain: No evidence of large-territorial acute infarction. No parenchymal hemorrhage. No mass lesion. No extra-axial collection. No mass effect or midline shift. No hydrocephalus. Basilar cisterns are patent. Vascular: No hyperdense vessel. Skull: No acute fracture or focal lesion. Sinuses/Orbits: Paranasal sinuses and mastoid air cells are clear. The orbits are unremarkable. Other: Mild right scalp subcutaneus soft tissue edema. CT CERVICAL SPINE FINDINGS Alignment: Normal. Skull base and vertebrae: No acute fracture. Multilevel mild moderate degenerative changes of the spine. No aggressive appearing focal osseous lesion or focal pathologic process. Soft tissues and spinal canal: No prevertebral fluid or swelling. No visible canal hematoma. Disc levels:  Mild intervertebral disc space narrowing. Upper chest:  Unremarkable. Other: None. IMPRESSION: 1. No acute intracranial abnormality. 2. No acute displaced fracture or traumatic listhesis of the cervical spine. Electronically Signed   By: Iven Finn M.D.   On: 11/02/2020 19:43   CT Cervical Spine Wo Contrast  Result Date: 11/02/2020 CLINICAL DATA:  Head trauma, loss of consciousness. Laceration to right back side of head. EXAM: CT HEAD WITHOUT CONTRAST CT CERVICAL SPINE WITHOUT CONTRAST TECHNIQUE: Multidetector CT imaging of the head and cervical spine was performed following the standard protocol without intravenous contrast. Multiplanar CT image reconstructions of the cervical spine were also generated. COMPARISON:  None. FINDINGS: CT HEAD FINDINGS Brain: No evidence of large-territorial acute infarction. No parenchymal hemorrhage. No mass lesion. No extra-axial collection. No mass effect or midline shift. No hydrocephalus. Basilar cisterns are patent. Vascular: No hyperdense vessel. Skull: No acute fracture or focal lesion. Sinuses/Orbits: Paranasal sinuses and mastoid air cells are clear. The orbits are unremarkable. Other: Mild right scalp subcutaneus soft tissue edema. CT CERVICAL SPINE FINDINGS Alignment: Normal. Skull base and vertebrae: No acute fracture. Multilevel mild moderate degenerative changes of the spine. No aggressive appearing focal osseous lesion or focal pathologic process. Soft tissues and spinal canal: No prevertebral fluid or swelling.  No visible canal hematoma. Disc levels:  Mild intervertebral disc space narrowing. Upper chest: Unremarkable. Other: None. IMPRESSION: 1. No acute intracranial abnormality. 2. No acute displaced fracture or traumatic listhesis of the cervical spine. Electronically Signed   By: Iven Finn M.D.   On: 11/02/2020 19:43     EKG: Independently reviewed, with result as described above.    Assessment/Plan   Gabriel Martin is a 79 y.o. male with medical history significant for hyperlipidemia, stage  III chronic kidney disease, BPH, who is admitted to [facility] on 11/02/2020 with syncope after presenting from home to [facility] Emergency Department complaining of a single episode of loss of consciousness.    Principal Problem:   Syncope Active Problems:   Acid reflux   PVC's (premature ventricular contractions)   BPH (benign prostatic hyperplasia)   CKD (chronic kidney disease), stage III (HCC)   Elevated troponin    #) Syncope: 1 episode of syncope earlier today, with the apparent absenc prodrome. Given the absence of prodrome, differential includes ventricular arrhythmia, with PVC noted on presenting EKG. ACS is felt to be less likely at this time, initial troponin demonstrates very mild elevation in the absence of any acute ischemic changes identified on presenting EKG. No recent or associated chest pain. As the patient had just stood up from a prolonged period of sitting for his lunch, differential also includes orthostatic hypotension. Not associated with any acute focal neurologic deficits. Clinically acute ischemic stroke versus seizure appear less likely at this time. Of note noncontrast CT of the head showed no evidence of acute intracranial process. Will closely monitor overnight on telemetry, and trend serial troponin. Given mild elevation in initial troponin, will check echocardiogram the morning, including for evaluation of any focal wall motion abnormalities. Will also evaluate for evidence of orthostatic hypotension, as further described below.  Plan: Please see nursing communication order requesting that orthostatic vital signs x1 set be checked and documented. Monitor on telemetry. Close monitoring of ensuing vital signs. Trend serial troponin, as above.. Echocardiogram in the morning, as above. Check CMP, with attention for interval serum potassium level and CBC in the morning. Add on serum magnesium level. Fall precautions ordered.    #) Mildly elevated  troponin: Presenting high-sensitivity troponin 9x1 found to be mildly elevated at 27, with no prior troponin level available in the EMR for point comparison. At the present time, this is of unclear significance, but warrants further monitoring given presenting syncope. As described above, ACS is felt to be less likely at this time, particularly with a complete absence of any recent chest discomfort, while EKG shows no evidence of acute ischemic changes. In the setting of thrombocytopenia, the patient does not received an aspirin at this time, however if subsequent troponin shows further elevation, with then consider ordering a dose of aspirin. It is also possible that the troponin is mildly elevated purely on the basis of the fall itself. Consider checking CK-MB for assistance with correlation of subsequent values.  Plan: Monitor serial troponin levels, as above. Monitor on telemetry. Add on serum magnesium level, as above. Echocardiograms been ordered for the morning to evaluate for any evidence of focal wall motion normalities. Additional work-up and management of presenting syncope, as above. Outpatient statin resumed.      #) Premature ventricular complexes: Presenting EKG shows a single PVC, well telemetry shows intermittent PVCs. Warrants additional evaluation given presenting syncope and the apparent absence of any prodromal features, raising the possibility of ventricular  arrhythmia as a contributing factor leading to presenting syncope. Will closely monitor on telemetry overnight, and can get additional syncope evaluation, as further described above.   Plan: Add on serum magnesium level, with plan to provide as needed supplementation for to maintain serum magnesium level of greater than or equal to 2.0, which will reduce risk for subsequent nuclear arrhythmia. Repeat BMP in the morning, with close attention to interval serum potassium level, with plan to provide as needed potassium supplementation  in order to maintain this value of greater than or equal to 4.0 understood my rationale. Monitor on telemetry overnight. Echocardiogram in the morning. Monitor serial troponin values, as above.     #) Hyperlipidemia: On lovastatin at home.  Plan: Continue statin.     #) GERD: On ranitidine as an outpatient.  Plan: Hold for now.     #) Stage III chronic kidney disease: Per chart review there is a documented history of such, however I am unable to find any prior serum creatinine data points via chart review, including the evaluation of Care Everywhere. Presenting serum creatinine of 1.38 is consistent with this documented history, and therefore suspected to represent patient's baseline.  Plan: Monitor strict I's and O's and daily weights. Repeat BMP in the morning.    DVT prophylaxis: SCDs Code Status: Full code Family Communication: none Disposition Plan: Per Rounding Team Consults called: none  Admission status: Observation; med telemetry.    Of note, this patient was added by me to the following Admit List/Treatment Team:  armcadmits     PLEASE NOTE THAT DRAGON DICTATION SOFTWARE WAS USED IN THE CONSTRUCTION OF THIS NOTE.   [place] Pager [phone] From 12PM- 12AM  Otherwise, please contact night-coverage  [email or web address] Password [facility]  11/02/2020, 8:18 PM

## 2020-11-02 NOTE — ED Provider Notes (Signed)
[facility] Emergency Department Provider Note   ____________________________________________   First MD Initiated Contact with Patient 11/02/20 1918     (approximate)  I have reviewed the triage vital signs and the nursing notes.   HISTORY  Chief Complaint Loss of Consciousness    HPI Gabriel Martin is a 79 y.o. male with possible history of hyperlipidemia, GERD, thrombocytopenia, and CKD who presents to the ED following syncope.  Patient reports that he had just got done having a meal at [employer] when he started to feel lightheaded, he walked out of the restaurant and lost consciousness, falling to the ground and hitting his head.  His wife states that he seemed to go blank and was having trouble responding to her before he fell to the ground.  Patient states he was feeling well prior to this episode, denies any chest pain or shortness of breath either before the episode or with it.  He states he feels back to normal now, denies any further lightheadedness.  He denies any history of similar episodes in the past and denies any significant cardiac history.  He does report some pain around where he struck his head and he also has a scrape on his left hand.        Past Medical History:  Diagnosis Date  . Acid reflux 05/12/2015  . Anemia   . Arthritis    finger thumb  . Benign enlargement of prostate   . Benign neoplasm of large bowel   . Benign prostatic hyperplasia with urinary obstruction 05/12/2015  . BPH (benign prostatic hyperplasia)   . BPH with obstruction/lower urinary tract symptoms 05/12/2015  . Change in blood platelet count 05/12/2015  . Chronic kidney disease 05/12/2015  . GERD (gastroesophageal reflux disease)   . HLD (hyperlipidemia) 05/12/2015  . Hyperlipidemia   . Nocturia associated with benign prostatic hypertrophy   . Penile ulcer 05/12/2015  . Skin cancer   . Thrombocytopenia [facility])     Patient Active Problem List    Diagnosis Date Noted  . BPH with obstruction/lower urinary tract symptoms 05/12/2015  . Penile ulcer 05/12/2015  . Chronic kidney disease 05/12/2015  . Acid reflux 05/12/2015  . HLD (hyperlipidemia) 05/12/2015  . Change in blood platelet count 05/12/2015  . Thrombocytopenia ([place]) 05/12/2015    Past Surgical History:  Procedure Laterality Date  . CATARACT EXTRACTION W/PHACO Left 02/29/2016   Procedure: CATARACT EXTRACTION PHACO AND INTRAOCULAR LENS PLACEMENT ([place]) left;  Surgeon: Leandrew Koyanagi, MD;  Location: [place];  Service: Ophthalmology;  Laterality: Left;  . CATARACT EXTRACTION W/PHACO Right 04/25/2016   Procedure: CATARACT EXTRACTION PHACO AND INTRAOCULAR LENS PLACEMENT ([facility]);  Surgeon: Leandrew Koyanagi, MD;  Location: [place];  Service: Ophthalmology;  Laterality: Right;  . COLONOSCOPY WITH PROPOFOL N/A 07/01/2017   Procedure: COLONOSCOPY WITH PROPOFOL;  Surgeon: Manya Silvas, MD;  Location: [facility] ENDOSCOPY;  Service: Endoscopy;  Laterality: N/A;  . HEMORRHOID SURGERY      Prior to Admission medications   Medication Sig Start Date End Date Taking? Authorizing Provider  lovastatin (MEVACOR) 20 MG tablet Take 20 mg by mouth at bedtime.    [provider]  ranitidine (ZANTAC) 75 MG tablet Take 150 mg by mouth 2 (two) times daily.     [provider]    Allergies Patient has no known allergies.  Family History  Problem Relation Age of Onset  . Heart disease Brother     Social History Social History  Tobacco Use  . Smoking status: Former Smoker    Types: Cigarettes    Quit date: 11/26/1978    Years since quitting: 41.9  . Smokeless tobacco: Never Used  . Tobacco comment: quit 40 years ago  Vaping Use  . Vaping Use: Never used  Substance Use Topics  . Alcohol use: Yes    Alcohol/week: 6.0 standard drinks    Types: 6 Cans of beer per week    Comment: occasional  . Drug use: No    Review of  Systems  Constitutional: No fever/chills Eyes: No visual changes. ENT: No sore throat. Cardiovascular: Denies chest pain.  Positive for syncope. Respiratory: Denies shortness of breath. Gastrointestinal: No abdominal pain.  No nausea, no vomiting.  No diarrhea.  No constipation. Genitourinary: Negative for dysuria. Musculoskeletal: Negative for back pain. Skin: Negative for rash. Neurological: Positive for headaches, negative for focal weakness or numbness.  ____________________________________________   PHYSICAL EXAM:  VITAL SIGNS: ED Triage Vitals [11/02/20 1848]  Enc Vitals Group     BP (!) 163/70     Pulse Rate 80     Resp 16     Temp 98.1 F (36.7 C)     Temp Source Oral     SpO2 94 %     Weight 200 lb (90.7 kg)     Height 5\' 6"  (1.676 m)     Head Circumference      Peak Flow      Pain Score 3     Pain Loc      Pain Edu?      Excl. in [place]?     Constitutional: Alert and oriented. Eyes: Conjunctivae are normal. Head: Superficial laceration to right parietal scalp.  No active bleeding noted. Nose: No congestion/rhinnorhea. Mouth/Throat: Mucous membranes are moist. Neck: Normal ROM, no midline cervical spine tenderness. Cardiovascular: Normal rate, regular rhythm. Grossly normal heart sounds.  2+ radial and DP pulses bilaterally. Respiratory: Normal respiratory effort.  No retractions. Lungs CTAB. Gastrointestinal: Soft and nontender. No distention. Genitourinary: deferred Musculoskeletal: No lower extremity tenderness nor edema.  No bony tenderness to bilateral upper extremities, range of motion intact without pain. Neurologic:  Normal speech and language. No gross focal neurologic deficits are appreciated. Skin:  Skin is warm, dry and intact. No rash noted.  Abrasion to left hand. Psychiatric: Mood and affect are normal. Speech and behavior are normal.  ____________________________________________   LABS (all labs ordered are listed, but only abnormal results  are displayed)  Labs Reviewed  BASIC METABOLIC PANEL - Abnormal; Notable for the following components:      Result Value   Glucose, Bld 105 (*)    BUN 26 (*)    Creatinine, Ser 1.38 (*)    GFR, Estimated 52 (*)    All other components within normal limits  CBC - Abnormal; Notable for the following components:   Platelets 99 (*)    All other components within normal limits  RESP PANEL BY RT-PCR (FLU A&B, COVID) ARPGX2  URINALYSIS, COMPLETE (UACMP) WITH MICROSCOPIC  TROPONIN I (HIGH SENSITIVITY)   ____________________________________________  EKG  ED ECG REPORT I, Blake Divine, the attending physician, personally viewed and interpreted this ECG.   Date: 11/02/2020  EKG Time: 18:53  Rate: 85  Rhythm: normal sinus rhythm, frequent PVC's noted  Axis: Normal  Intervals:none  ST&T Change: None  ED ECG REPORT I, Blake Divine, the attending physician, personally viewed and interpreted this ECG.   Date: 11/02/2020  EKG Time: 18:57  Rate: 74  Rhythm: normal sinus rhythm, frequent PVC's noted  Axis: RAD  Intervals:nonspecific intraventricular conduction delay  ST&T Change: None   PROCEDURES  Procedure(s) performed (including Critical Care):  .1-3 Lead EKG Interpretation Performed by: Blake Divine, MD Authorized by: Blake Divine, MD     Interpretation: abnormal     ECG rate:  72   ECG rate assessment: normal     Rhythm: sinus rhythm     Ectopy: PVCs     Conduction: normal       ____________________________________________   INITIAL IMPRESSION / ASSESSMENT AND PLAN / ED COURSE       79 year old male with past medical history of hyperlipidemia, GERD, thrombocytopenia, and CKD who presents to the ED following syncopal episode where he felt lightheaded and then fell backwards, striking his head.  He is awake and alert with no focal neurologic deficits, CT head and C-spine are pending.  Superficial laceration to his scalp does not require repair.  He has a  superficial abrasion to his left hand but I doubt bony injury.  EKG shows normal sinus rhythm but with frequent PVCs.  We will check labs including troponin, but given the patient's advanced age and frequent PVCs, anticipate admission for further syncope work-up.  CT head reviewed by me and no obvious hemorrhage CT head and C-spine negative for acute process per radiology.  Labs thus far are unremarkable and patient continues to deny any chest pain or shortness of breath.  Case discussed with hospitalist for admission for further syncopal work-up.      ____________________________________________   FINAL CLINICAL IMPRESSION(S) / ED DIAGNOSES  Final diagnoses:  Syncope, unspecified syncope type  Frequent PVCs     ED Discharge Orders    None       Note:  This document was prepared using Dragon voice recognition software and may include unintentional dictation errors.   Blake Divine, MD 11/02/20 2020

## 2020-11-02 NOTE — ED Notes (Signed)
ED Provider at bedside. 

## 2020-11-02 NOTE — ED Notes (Signed)
No acute changes. Pt has just completed ortho checks (ortho negative)-- denies pain, dizziness, sob but does state that he has noticed at home at times he is dyspneic upon exertion; states possibly over last 2 weeks.  Pt remains on cont cardiac monitor; NSR 70s-80s with multifocal PVCs.  Will continue to monitor for acute changes and maintain plan of care- admission confirmed - pt now awaits bed assignment.

## 2020-11-02 NOTE — ED Notes (Signed)
Patient transported to CT 

## 2020-11-02 NOTE — ED Triage Notes (Signed)
Patient arrived by POV after LOC. Patient reports he had just finished eating at [employer], "felt funny while paying", and once he got outside he passed out. Laceration noted to right back side of head after falling to ground and skin tear to left hand.

## 2020-11-02 NOTE — ED Notes (Signed)
Entered room to find pt sitting up in w/c after completing CT with spouse at bedside.  Pt assisted from w/c to stretcher without incident requiring 1 person min assist.  Pt denies any c/o dizziness or pain.  Skin tear to L palm and R elbow; no active bleeding -- laceration to R temporal scalp region - no active bleeding noted.  Pt assisted into gown and placed on continuous cardiac monitoring -- BP 145/91 - all other vitals WNL.  RR even and unlabored on RA; NSR with frequent PVCs on cardiac monitor.  Abdomen round and soft.  BLE mild nonpitting edema with brownish discoloration -- pt denies known vascular issues.  Call bell in reach with instructions to press red button for assistance- pt and spouse acknowledge verbal understanding.  Will monitor for acute changes and maintain plan of care.

## 2020-11-03 ENCOUNTER — Encounter: Payer: Self-pay | Admitting: Internal Medicine

## 2020-11-03 ENCOUNTER — Inpatient Hospital Stay: Payer: PPO

## 2020-11-03 DIAGNOSIS — I63512 Cerebral infarction due to unspecified occlusion or stenosis of left middle cerebral artery: Secondary | ICD-10-CM | POA: Diagnosis not present

## 2020-11-03 DIAGNOSIS — K219 Gastro-esophageal reflux disease without esophagitis: Secondary | ICD-10-CM | POA: Diagnosis present

## 2020-11-03 DIAGNOSIS — G629 Polyneuropathy, unspecified: Secondary | ICD-10-CM | POA: Diagnosis not present

## 2020-11-03 DIAGNOSIS — E785 Hyperlipidemia, unspecified: Secondary | ICD-10-CM | POA: Diagnosis not present

## 2020-11-03 DIAGNOSIS — S0101XA Laceration without foreign body of scalp, initial encounter: Secondary | ICD-10-CM | POA: Diagnosis present

## 2020-11-03 DIAGNOSIS — Z9842 Cataract extraction status, left eye: Secondary | ICD-10-CM | POA: Diagnosis not present

## 2020-11-03 DIAGNOSIS — I5023 Acute on chronic systolic (congestive) heart failure: Secondary | ICD-10-CM | POA: Diagnosis not present

## 2020-11-03 DIAGNOSIS — R55 Syncope and collapse: Secondary | ICD-10-CM | POA: Diagnosis not present

## 2020-11-03 DIAGNOSIS — Z8249 Family history of ischemic heart disease and other diseases of the circulatory system: Secondary | ICD-10-CM | POA: Diagnosis not present

## 2020-11-03 DIAGNOSIS — W19XXXA Unspecified fall, initial encounter: Secondary | ICD-10-CM | POA: Diagnosis present

## 2020-11-03 DIAGNOSIS — Z961 Presence of intraocular lens: Secondary | ICD-10-CM | POA: Diagnosis present

## 2020-11-03 DIAGNOSIS — I517 Cardiomegaly: Secondary | ICD-10-CM

## 2020-11-03 DIAGNOSIS — Z9841 Cataract extraction status, right eye: Secondary | ICD-10-CM | POA: Diagnosis not present

## 2020-11-03 DIAGNOSIS — Z85828 Personal history of other malignant neoplasm of skin: Secondary | ICD-10-CM | POA: Diagnosis not present

## 2020-11-03 DIAGNOSIS — D696 Thrombocytopenia, unspecified: Secondary | ICD-10-CM | POA: Diagnosis not present

## 2020-11-03 DIAGNOSIS — N401 Enlarged prostate with lower urinary tract symptoms: Secondary | ICD-10-CM | POA: Diagnosis present

## 2020-11-03 DIAGNOSIS — I493 Ventricular premature depolarization: Secondary | ICD-10-CM

## 2020-11-03 DIAGNOSIS — Z79899 Other long term (current) drug therapy: Secondary | ICD-10-CM | POA: Diagnosis not present

## 2020-11-03 DIAGNOSIS — N1831 Chronic kidney disease, stage 3a: Secondary | ICD-10-CM

## 2020-11-03 DIAGNOSIS — I130 Hypertensive heart and chronic kidney disease with heart failure and stage 1 through stage 4 chronic kidney disease, or unspecified chronic kidney disease: Secondary | ICD-10-CM | POA: Diagnosis present

## 2020-11-03 DIAGNOSIS — N182 Chronic kidney disease, stage 2 (mild): Secondary | ICD-10-CM | POA: Diagnosis present

## 2020-11-03 DIAGNOSIS — S60512A Abrasion of left hand, initial encounter: Secondary | ICD-10-CM | POA: Diagnosis present

## 2020-11-03 DIAGNOSIS — Z87891 Personal history of nicotine dependence: Secondary | ICD-10-CM | POA: Diagnosis not present

## 2020-11-03 DIAGNOSIS — I361 Nonrheumatic tricuspid (valve) insufficiency: Secondary | ICD-10-CM | POA: Diagnosis not present

## 2020-11-03 DIAGNOSIS — I429 Cardiomyopathy, unspecified: Secondary | ICD-10-CM | POA: Diagnosis not present

## 2020-11-03 DIAGNOSIS — I351 Nonrheumatic aortic (valve) insufficiency: Secondary | ICD-10-CM | POA: Diagnosis not present

## 2020-11-03 DIAGNOSIS — I6340 Cerebral infarction due to embolism of unspecified cerebral artery: Secondary | ICD-10-CM | POA: Diagnosis present

## 2020-11-03 DIAGNOSIS — Z20822 Contact with and (suspected) exposure to covid-19: Secondary | ICD-10-CM | POA: Diagnosis present

## 2020-11-03 DIAGNOSIS — R29701 NIHSS score 1: Secondary | ICD-10-CM | POA: Diagnosis present

## 2020-11-03 LAB — COMPREHENSIVE METABOLIC PANEL
ALT: 24 U/L (ref 0–44)
AST: 19 U/L (ref 15–41)
Albumin: 4.1 g/dL (ref 3.5–5.0)
Alkaline Phosphatase: 39 U/L (ref 38–126)
Anion gap: 8 (ref 5–15)
BUN: 22 mg/dL (ref 8–23)
CO2: 25 mmol/L (ref 22–32)
Calcium: 9.2 mg/dL (ref 8.9–10.3)
Chloride: 106 mmol/L (ref 98–111)
Creatinine, Ser: 1.24 mg/dL (ref 0.61–1.24)
GFR, Estimated: 59 mL/min — ABNORMAL LOW (ref >60)
Glucose, Bld: 112 mg/dL — ABNORMAL HIGH (ref 70–99)
Potassium: 4.3 mmol/L (ref 3.5–5.1)
Sodium: 139 mmol/L (ref 135–145)
Total Bilirubin: 1.3 mg/dL — ABNORMAL HIGH (ref 0.3–1.2)
Total Protein: 6.6 g/dL (ref 6.5–8.1)

## 2020-11-03 LAB — CBC
HCT: 41.6 % (ref 39.0–52.0)
Hemoglobin: 13.8 g/dL (ref 13.0–17.0)
MCH: 32.4 pg (ref 26.0–34.0)
MCHC: 33.2 g/dL (ref 30.0–36.0)
MCV: 97.7 fL (ref 80.0–100.0)
Platelets: 101 10*3/uL — ABNORMAL LOW (ref 150–400)
RBC: 4.26 MIL/uL (ref 4.22–5.81)
RDW: 12.6 % (ref 11.5–15.5)
WBC: 5.6 10*3/uL (ref 4.0–10.5)
nRBC: 0 % (ref 0.0–0.2)

## 2020-11-03 LAB — TROPONIN I (HIGH SENSITIVITY): Troponin I (High Sensitivity): 31 ng/L — ABNORMAL HIGH (ref <18)

## 2020-11-03 LAB — MAGNESIUM: Magnesium: 2.2 mg/dL (ref 1.7–2.4)

## 2020-11-03 LAB — PROTIME-INR
INR: 1.2 (ref 0.8–1.2)
Prothrombin Time: 14.4 seconds (ref 11.4–15.2)

## 2020-11-03 IMAGING — MR MR HEAD W/O CM
10 of 11 series · 39 of 48 positions shown · non-contrast
Comparison: CT head/cervical spine [DATE].

CLINICAL DATA: Provided history: Syncope, recurrent.

EXAM:
MRI HEAD WITHOUT CONTRAST
TECHNIQUE: Multiplanar, multiecho pulse sequences of the brain and surrounding
structures were obtained without intravenous contrast.

[Series 5: ax dwi_tracew · axial · 3.0mm · 0.60mm/px · z∈[-86,+65]mm · 4 of 48 slices shown]
[im 1/48]
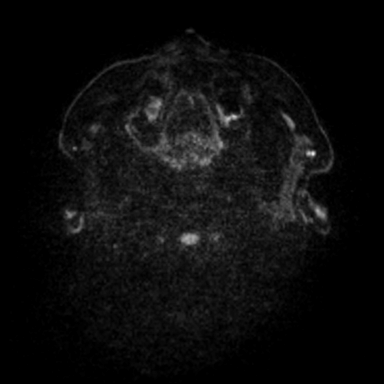
[im 16/48]
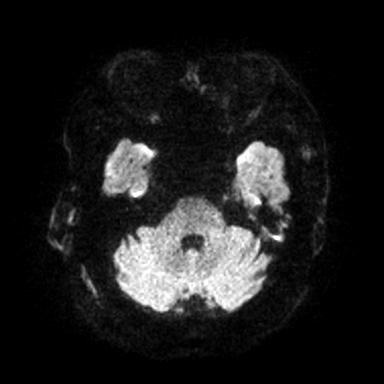
[im 32/48]
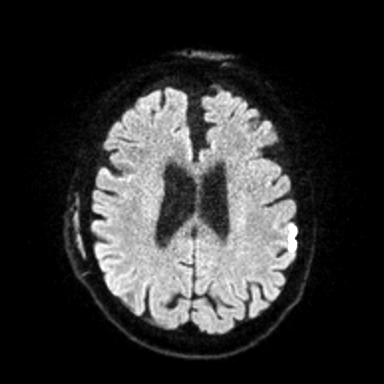
[im 48/48]
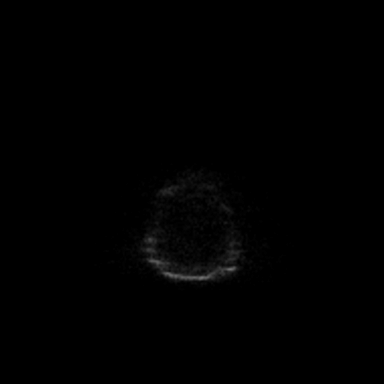

[Series 6: ax dwi_adc · axial · 3.0mm · 0.60mm/px · z∈[-86,+65]mm · 4 of 48 slices shown]
[im 1/48]
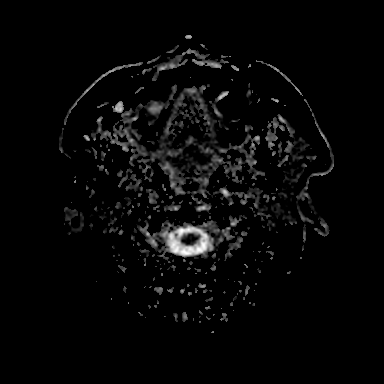
[im 16/48]
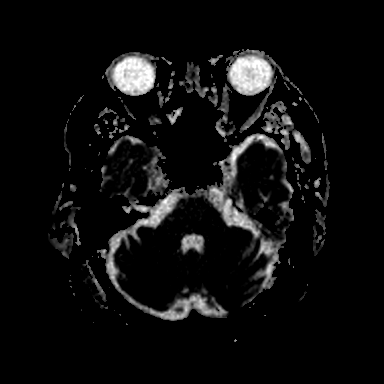
[im 32/48]
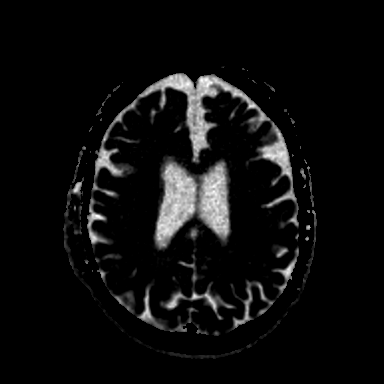
[im 48/48]
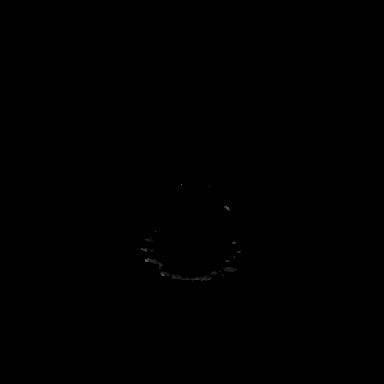

[Series 7: cor dwi_tracew · coronal · 5.0mm · 0.60mm/px · 6 of 76 slices shown]
[im 1/76]
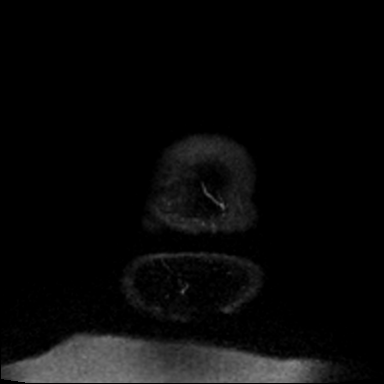
[im 16/76]
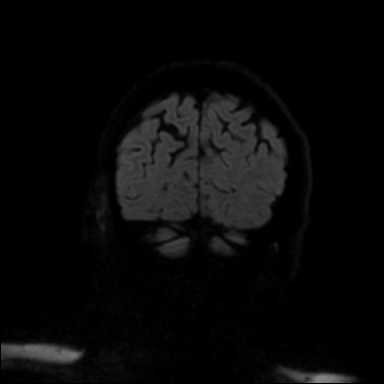
[im 31/76]
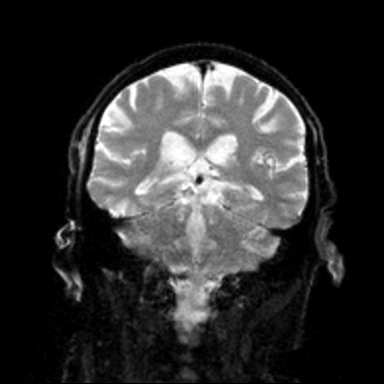
[im 46/76]
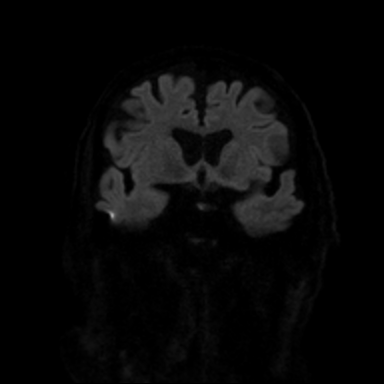
[im 61/76]
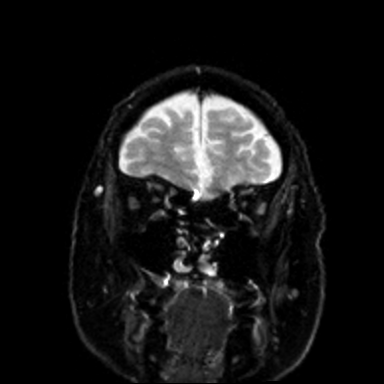
[im 76/76]
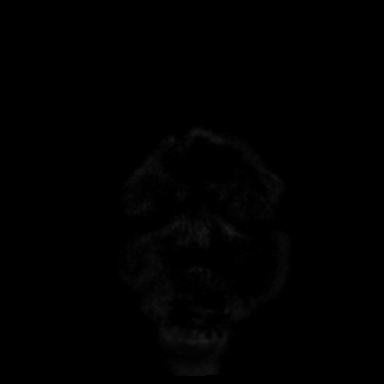

[Series 8: cor dwi_adc · coronal · 5.0mm · 0.60mm/px · 3 of 38 slices shown]
[im 1/38]
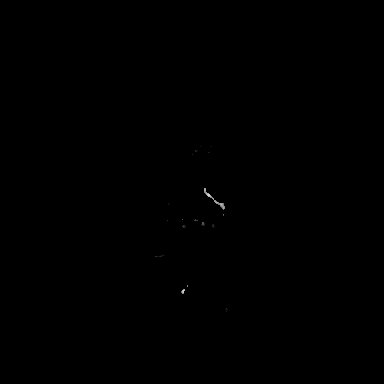
[im 19/38]
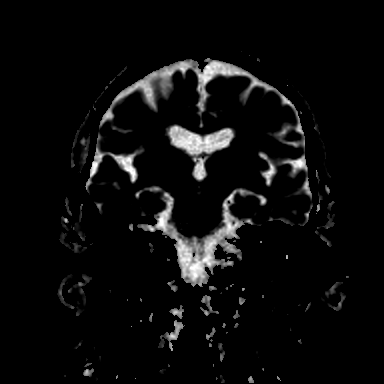
[im 38/38]
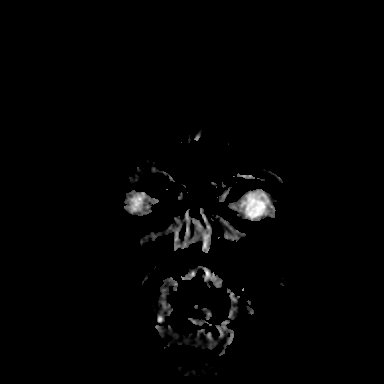

[Series 9: T1 · sagittal · 5.0mm · 0.62mm/px · 2 of 25 slices shown (1 of 2)]
[im 1/25]
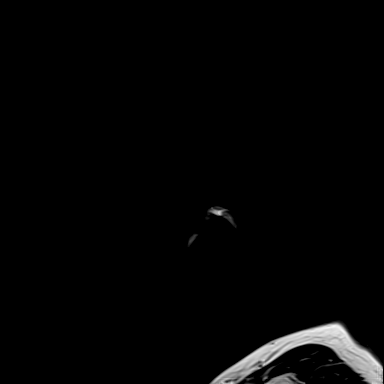
[im 25/25]
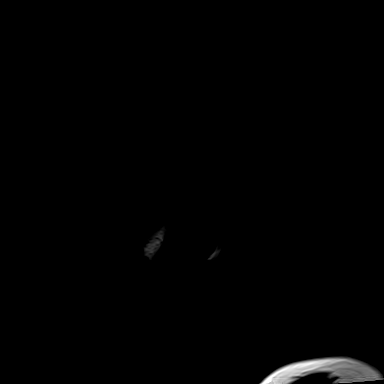

[Series 10: T2 · axial · 5.0mm · 0.53mm/px · z∈[-83,+63]mm · 2 of 26 slices shown (1 of 2)]
[im 1/26]
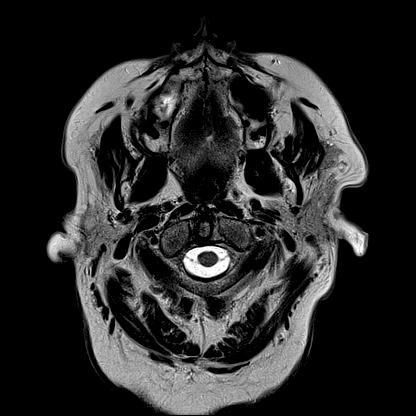
[im 26/26]
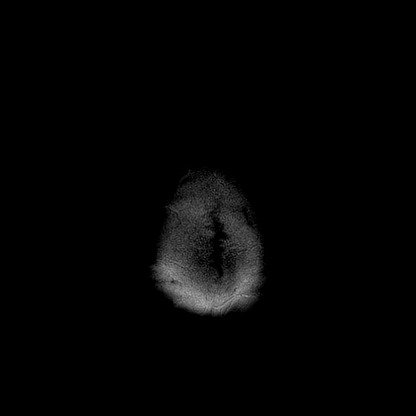

[Series 12: pha_images · axial · 3.0mm · 0.90mm/px · z∈[-91,+78]mm · 4 of 59 slices shown]
[im 1/59]
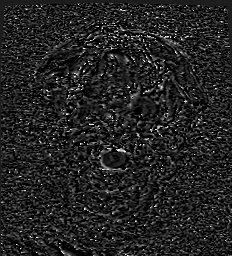
[im 20/59]
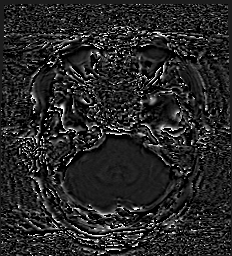
[im 39/59]
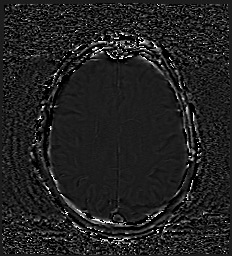
[im 59/59]
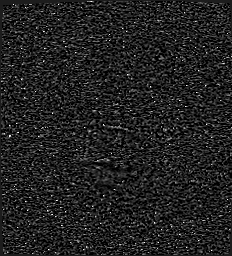

[Series 15: FLAIR · axial · 3.0mm · 0.53mm/px · z∈[-89,+69]mm · 4 of 55 slices shown]
[im 1/55]
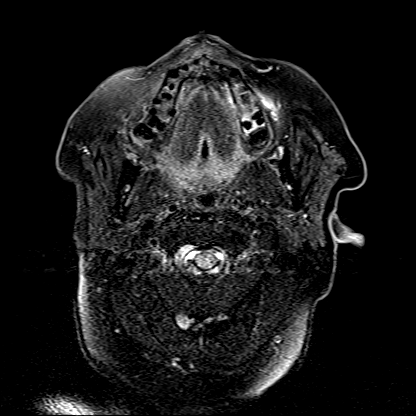
[im 19/55]
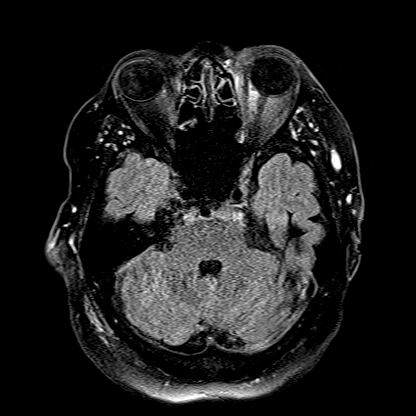
[im 37/55]
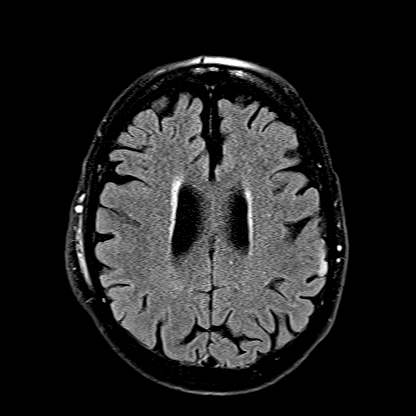
[im 55/55]
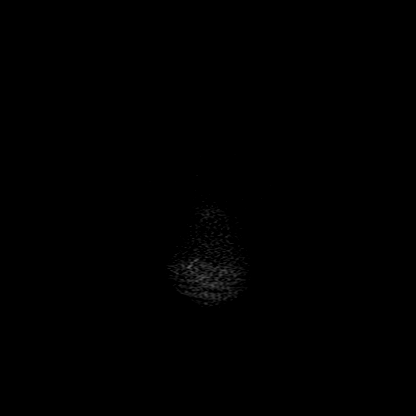

[Series 16: T1 · axial · 1.0mm · 0.98mm/px · z∈[-92,+78]mm · 8 of 176 slices shown (2 of 2)]
[im 1/176]
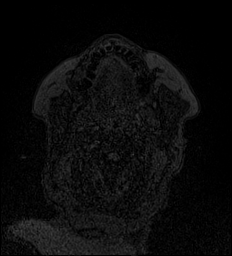
[im 30/176]
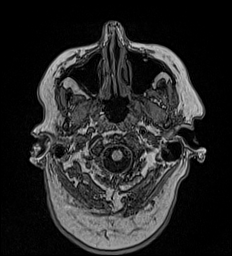
[im 59/176]
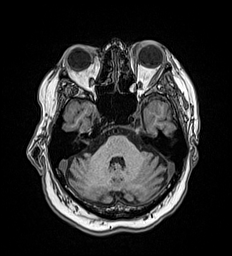
[im 73/176]
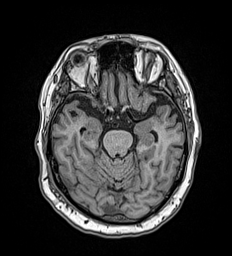
[im 103/176]
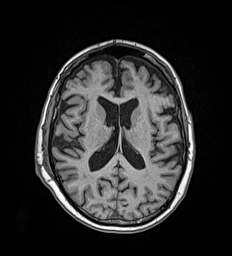
[im 117/176]
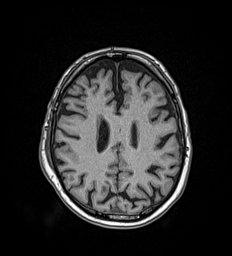
[im 146/176]
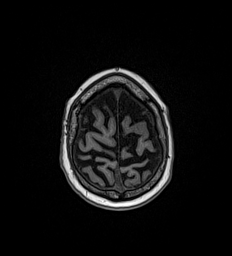
[im 176/176]
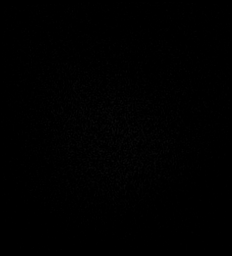

[Series 17: T2 · coronal · 5.0mm · 0.57mm/px · 2 of 29 slices shown (2 of 2)]
[im 1/29]
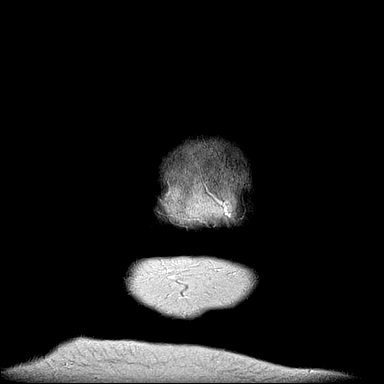
[im 29/29]
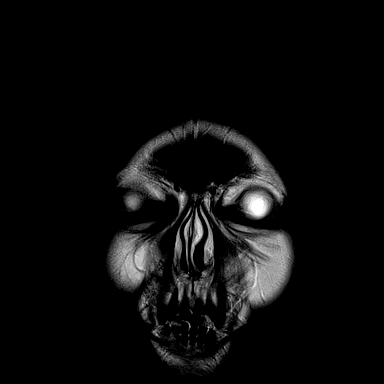

[39 of 48 positions shown; findings below may reference images not displayed]

FINDINGS: Brain:

Mild cerebral and cerebellar atrophy.

15 mm focus of restricted diffusion within the left parietal cortex
consistent with acute infarction (series 5, image 32) (series 7,
images 51-54).

Additional punctate acute cortical infarct within the posterior left
frontal lobe (motor strip)(series 5, image 39).

Mild multifocal T2/FLAIR hyperintensity within the cerebral white
matter is nonspecific, but compatible with chronic small vessel
ischemic disease. This includes a chronic lacunar infarct within the
anterior left frontal lobe white matter (series 15, image 29).

No evidence of intracranial mass.

No chronic intracranial blood products.

No extra-axial fluid collection.

No midline shift.

Vascular: Expected proximal arterial flow voids.

Skull and upper cervical spine: No focal marrow lesion.

Sinuses/Orbits: Visualized orbits show no acute finding. Mild right
ethmoid and maxillary sinus mucosal thickening.

Other: Nonspecific edema within the right temporoparietal scalp.

Impression #1 below will be called to the ordering clinician or
representative by the Radiologist Assistant, and communication
documented in the PACS or [REDACTED].
IMPRESSION: Two acute left MCA territory cortical infarcts, one measuring 15 mm
within the left parietal lobe and the other punctate within the left
frontal lobe motor strip.

Background mild parenchymal and chronic small vessel ischemic
disease.

Nonspecific edema within the right temporoparietal scalp.

Mild right ethmoid and maxillary sinus mucosal thickening.

## 2020-11-03 MED ORDER — CLOPIDOGREL BISULFATE 75 MG PO TABS
75.0000 mg | ORAL_TABLET | Freq: Every day | ORAL | Status: DC
  Administered 2020-11-03 – 2020-11-04 (×2): 75 mg via ORAL
  Filled 2020-11-03 (×2): qty 1

## 2020-11-03 MED ORDER — GABAPENTIN 100 MG PO CAPS
100.0000 mg | ORAL_CAPSULE | Freq: Two times a day (BID) | ORAL | Status: DC
  Administered 2020-11-03 – 2020-11-04 (×3): 100 mg via ORAL
  Filled 2020-11-03 (×3): qty 1

## 2020-11-03 MED ORDER — ASPIRIN EC 81 MG PO TBEC
81.0000 mg | DELAYED_RELEASE_TABLET | Freq: Every day | ORAL | Status: DC
  Administered 2020-11-03 – 2020-11-04 (×2): 81 mg via ORAL
  Filled 2020-11-03 (×2): qty 1

## 2020-11-03 MED ORDER — FUROSEMIDE 20 MG PO TABS: 20.0000 mg | ORAL_TABLET | Freq: Every day | ORAL | Status: DC

## 2020-11-03 MED ORDER — FUROSEMIDE 10 MG/ML IJ SOLN
40.0000 mg | Freq: Once | INTRAMUSCULAR | Status: AC
  Administered 2020-11-03: 40 mg via INTRAVENOUS
  Filled 2020-11-03: qty 4

## 2020-11-03 MED ORDER — POTASSIUM CHLORIDE CRYS ER 20 MEQ PO TBCR
20.0000 meq | EXTENDED_RELEASE_TABLET | Freq: Once | ORAL | Status: AC
  Administered 2020-11-03: 20 meq via ORAL
  Filled 2020-11-03: qty 1

## 2020-11-03 MED ORDER — FAMOTIDINE 20 MG PO TABS
20.0000 mg | ORAL_TABLET | Freq: Every day | ORAL | Status: DC
  Administered 2020-11-03: 22:00:00 20 mg via ORAL
  Filled 2020-11-03: qty 1

## 2020-11-03 NOTE — Progress Notes (Signed)
Pt dc'd to room 120 via w/c in apparent stable condition

## 2020-11-03 NOTE — ED Notes (Signed)
Called lab to request phlebotomy be sent for AM labs, lab advises they will inform the 6AM team when they arrive.

## 2020-11-03 NOTE — Progress Notes (Signed)
Patient ID: Gabriel Martin, male   DOB: [birth year], 79 y.o.   MRN: [id number] [facility] PROGRESS NOTE  Gabriel Martin HCW:[id number] DOB: [birth year] DOA: 11/02/2020 PCP: [name], MD  HPI/Subjective: Patient was at [employer] and a a meal.  Upon leaving he turned over his drinking it spilled on the table.  He went to go pack a week but was having trouble coordinating his hands to pay with money.  On his way out he collapsed and was out cold for a few minutes and then came through.  He hit his head and his hand.  No loss of urine or bowel function.  No tongue biting.  His wife drove him to the hospital and he was unable to coordinate the phone in order to call his son.  Feels better today after coming into the hospital.  He complains of shortness of breath going on for few weeks.  Objective: Vitals:   11/03/20 1210 11/03/20 1527  BP: 125/75 113/87  Pulse:  68  Resp:  18  Temp: (!) 97.5 F (36.4 C) 98.2 F (36.8 C)  SpO2:  99%   No intake or output data in the 24 hours ending 11/03/20 1602 Filed Weights   11/02/20 1848  Weight: 90.7 kg    ROS: Review of Systems  Respiratory: Positive for shortness of breath.   Cardiovascular: Negative for chest pain.  Gastrointestinal: Negative for abdominal pain, nausea and vomiting.  Musculoskeletal: Negative for joint pain.   Exam: Physical Exam HENT:     Head: Normocephalic.     Mouth/Throat:     Pharynx: No oropharyngeal exudate.  Eyes:     General: Lids are normal.     Conjunctiva/sclera: Conjunctivae normal.     Pupils: Pupils are equal, round, and reactive to light.  Cardiovascular:     Rate and Rhythm: Normal rate and regular rhythm.     Heart sounds: Normal heart sounds, S1 normal and S2 normal.  Pulmonary:     Breath sounds: Examination of the right-lower field reveals decreased breath sounds. Examination of the left-lower field reveals decreased breath sounds. Decreased breath sounds present. No  wheezing, rhonchi or rales.  Abdominal:     Palpations: Abdomen is soft.     Tenderness: There is no abdominal tenderness.  Musculoskeletal:     Right lower leg: Swelling present.     Left lower leg: Swelling present.  Skin:    General: Skin is warm.     Comments: Small abrasion left hand and right side of the head.  Neurological:     Mental Status: He is alert and oriented to person, place, and time.       Data Reviewed: Basic Metabolic Panel: Recent Labs  Lab 11/02/20 1908 11/02/20 2014 11/03/20 0631  NA 140  --  139  K 4.1  --  4.3  CL 106  --  106  CO2 24  --  25  GLUCOSE 105*  --  112*  BUN 26*  --  22  CREATININE 1.38*  --  1.24  CALCIUM 8.9  --  9.2  MG  --  2.1 2.2   Liver Function Tests: Recent Labs  Lab 11/03/20 0631  AST 19  ALT 24  ALKPHOS 39  BILITOT 1.3*  PROT 6.6  ALBUMIN 4.1   CBC: Recent Labs  Lab 11/02/20 1908 11/03/20 0631  WBC 6.8 5.6  HGB 14.3 13.8  HCT 43.8 41.6  MCV 98.9 97.7  PLT 99* 101*  Recent Results (from the past 240 hour(s))  Resp Panel by RT-PCR (Flu A&B, Covid) Nasopharyngeal Swab     Status: None   Collection Time: 11/02/20  8:13 PM   Specimen: Nasopharyngeal Swab; Nasopharyngeal(NP) swabs in vial transport medium  Result Value Ref Range Status   SARS Coronavirus 2 by RT PCR NEGATIVE NEGATIVE Final    Comment: (NOTE) SARS-CoV-2 target nucleic acids are NOT DETECTED.  The SARS-CoV-2 RNA is generally detectable in upper respiratory specimens during the acute phase of infection. The lowest concentration of SARS-CoV-2 viral copies this assay can detect is 138 copies/mL. A negative result does not preclude SARS-Cov-2 infection and should not be used as the sole basis for treatment or other patient management decisions. A negative result may occur with  improper specimen collection/handling, submission of specimen other than nasopharyngeal swab, presence of viral mutation(s) within the areas targeted by this  assay, and inadequate number of viral copies(<138 copies/mL). A negative result must be combined with clinical observations, patient history, and epidemiological information. The expected result is Negative.  Fact Sheet for Patients:  EntrepreneurPulse.com.au  Fact Sheet for Healthcare Providers:  IncredibleEmployment.be  This test is no t yet approved or cleared by the [place] FDA and  has been authorized for detection and/or diagnosis of SARS-CoV-2 by FDA under an Emergency Use Authorization (EUA). This EUA will remain  in effect (meaning this test can be used) for the duration of the COVID-19 declaration under Section 564(b)(1) of the Act, 21 U.S.C.section 360bbb-3(b)(1), unless the authorization is terminated  or revoked sooner.       Influenza A by PCR NEGATIVE NEGATIVE Final   Influenza B by PCR NEGATIVE NEGATIVE Final    Comment: (NOTE) The Xpert Xpress SARS-CoV-2/FLU/RSV plus assay is intended as an aid in the diagnosis of influenza from Nasopharyngeal swab specimens and should not be used as a sole basis for treatment. Nasal washings and aspirates are unacceptable for Xpert Xpress SARS-CoV-2/FLU/RSV testing.  Fact Sheet for Patients: EntrepreneurPulse.com.au  Fact Sheet for Healthcare Providers: IncredibleEmployment.be  This test is not yet approved or cleared by the [place] FDA and has been authorized for detection and/or diagnosis of SARS-CoV-2 by FDA under an Emergency Use Authorization (EUA). This EUA will remain in effect (meaning this test can be used) for the duration of the COVID-19 declaration under Section 564(b)(1) of the Act, 21 U.S.C. section 360bbb-3(b)(1), unless the authorization is terminated or revoked.  Performed at [facility], [address] [place],  [id number]      Studies: CT HEAD WO CONTRAST  Result Date: 11/02/2020 CLINICAL DATA:   Head trauma, loss of consciousness. Laceration to right back side of head. EXAM: CT HEAD WITHOUT CONTRAST CT CERVICAL SPINE WITHOUT CONTRAST TECHNIQUE: Multidetector CT imaging of the head and cervical spine was performed following the standard protocol without intravenous contrast. Multiplanar CT image reconstructions of the cervical spine were also generated. COMPARISON:  None. FINDINGS: CT HEAD FINDINGS Brain: No evidence of large-territorial acute infarction. No parenchymal hemorrhage. No mass lesion. No extra-axial collection. No mass effect or midline shift. No hydrocephalus. Basilar cisterns are patent. Vascular: No hyperdense vessel. Skull: No acute fracture or focal lesion. Sinuses/Orbits: Paranasal sinuses and mastoid air cells are clear. The orbits are unremarkable. Other: Mild right scalp subcutaneus soft tissue edema. CT CERVICAL SPINE FINDINGS Alignment: Normal. Skull base and vertebrae: No acute fracture. Multilevel mild moderate degenerative changes of the spine. No aggressive appearing focal osseous lesion or focal pathologic process. Soft  tissues and spinal canal: No prevertebral fluid or swelling. No visible canal hematoma. Disc levels:  Mild intervertebral disc space narrowing. Upper chest: Unremarkable. Other: None. IMPRESSION: 1. No acute intracranial abnormality. 2. No acute displaced fracture or traumatic listhesis of the cervical spine. Electronically Signed   By: Iven Finn M.D.   On: 11/02/2020 19:43   CT Cervical Spine Wo Contrast  Result Date: 11/02/2020 CLINICAL DATA:  Head trauma, loss of consciousness. Laceration to right back side of head. EXAM: CT HEAD WITHOUT CONTRAST CT CERVICAL SPINE WITHOUT CONTRAST TECHNIQUE: Multidetector CT imaging of the head and cervical spine was performed following the standard protocol without intravenous contrast. Multiplanar CT image reconstructions of the cervical spine were also generated. COMPARISON:  None. FINDINGS: CT HEAD FINDINGS Brain:  No evidence of large-territorial acute infarction. No parenchymal hemorrhage. No mass lesion. No extra-axial collection. No mass effect or midline shift. No hydrocephalus. Basilar cisterns are patent. Vascular: No hyperdense vessel. Skull: No acute fracture or focal lesion. Sinuses/Orbits: Paranasal sinuses and mastoid air cells are clear. The orbits are unremarkable. Other: Mild right scalp subcutaneus soft tissue edema. CT CERVICAL SPINE FINDINGS Alignment: Normal. Skull base and vertebrae: No acute fracture. Multilevel mild moderate degenerative changes of the spine. No aggressive appearing focal osseous lesion or focal pathologic process. Soft tissues and spinal canal: No prevertebral fluid or swelling. No visible canal hematoma. Disc levels:  Mild intervertebral disc space narrowing. Upper chest: Unremarkable. Other: None. IMPRESSION: 1. No acute intracranial abnormality. 2. No acute displaced fracture or traumatic listhesis of the cervical spine. Electronically Signed   By: Iven Finn M.D.   On: 11/02/2020 19:43   Portable chest 1 View  Result Date: 11/02/2020 CLINICAL DATA:  Syncopal episode EXAM: PORTABLE CHEST 1 VIEW COMPARISON:  None. FINDINGS: Cardiomegaly without overt pulmonary edema. No pleural effusion. Aortic atherosclerosis. No pneumothorax. IMPRESSION: Cardiomegaly. Electronically Signed   By: Donavan Foil M.D.   On: 11/02/2020 22:11    Scheduled Meds: . aspirin EC  81 mg Oral Daily  . famotidine  20 mg Oral QHS  . gabapentin  100 mg Oral BID  . pravastatin  20 mg Oral q1800    Assessment/Plan:  1. Syncope.  Patient was also having trouble coordinating his hands prior to the syncopal episode and had a period of confusion.  Patient not orthostatic.  Telemetry monitoring did show some PVCs.  Still awaiting echocardiogram.  We will get an MRI of the brain.  Start low-dose aspirin. 2. Chest x-ray showed cardiomegaly and the patient's been having shortness of breath going on for a  few weeks.  Will give a dose of Lasix and reassess tomorrow.  Echocardiogram ordered for syncope work-up but not done today.  Check a BNP in the a.m. 3. Hyperlipidemia unspecified on pravastatin 4. Neuropathy on gabapentin 5. Chronic thrombocytopenia 6. Chronic kidney disease stage IIIa. 7. GERD on H2 blocker        Code Status:     Code Status Orders  (From admission, onward)         Start     Ordered   11/02/20 2146  Full code  Continuous        11/02/20 2147        Code Status History    This patient has a current code status but no historical code status.   Advance Care Planning Activity     Family Communication: Wife at the bedside in the emergency room and again in the afternoon at the  bedside. Disposition Plan: Status is: Observation  Dispo: The patient is from: Home              Anticipated d/c is to: Home              Anticipated d/c date is: 11/04/2020              Patient currently awaiting echocardiogram.  Also ordering an MRI of the brain.  Time spent: 30 minutes  [place]

## 2020-11-03 NOTE — Plan of Care (Signed)
  Problem: Health Behavior/Discharge Planning: Goal: Ability to manage health-related needs will improve Outcome: Progressing   Problem: Activity: Goal: Risk for activity intolerance will decrease Outcome: Progressing   Problem: Safety: Goal: Ability to remain free from injury will improve Outcome: Progressing   Problem: Skin Integrity: Goal: Risk for impaired skin integrity will decrease Outcome: Progressing

## 2020-11-04 ENCOUNTER — Inpatient Hospital Stay (HOSPITAL_COMMUNITY)
Admit: 2020-11-04 | Discharge: 2020-11-04 | Disposition: A | Discharge status: Home / Self Care | Payer: PPO | Attending: Internal Medicine | Admitting: Internal Medicine

## 2020-11-04 ENCOUNTER — Inpatient Hospital Stay: Payer: PPO

## 2020-11-04 DIAGNOSIS — E785 Hyperlipidemia, unspecified: Secondary | ICD-10-CM

## 2020-11-04 DIAGNOSIS — I639 Cerebral infarction, unspecified: Secondary | ICD-10-CM

## 2020-11-04 DIAGNOSIS — G629 Polyneuropathy, unspecified: Secondary | ICD-10-CM

## 2020-11-04 DIAGNOSIS — I5023 Acute on chronic systolic (congestive) heart failure: Secondary | ICD-10-CM

## 2020-11-04 DIAGNOSIS — I63512 Cerebral infarction due to unspecified occlusion or stenosis of left middle cerebral artery: Principal | ICD-10-CM

## 2020-11-04 DIAGNOSIS — R55 Syncope and collapse: Secondary | ICD-10-CM

## 2020-11-04 DIAGNOSIS — I429 Cardiomyopathy, unspecified: Secondary | ICD-10-CM

## 2020-11-04 DIAGNOSIS — I361 Nonrheumatic tricuspid (valve) insufficiency: Secondary | ICD-10-CM

## 2020-11-04 DIAGNOSIS — I351 Nonrheumatic aortic (valve) insufficiency: Secondary | ICD-10-CM

## 2020-11-04 LAB — LIPID PANEL
Cholesterol: 137 mg/dL (ref 0–200)
HDL: 48 mg/dL (ref >40)
LDL Cholesterol: 75 mg/dL (ref 0–99)
Total CHOL/HDL Ratio: 2.9 RATIO
Triglycerides: 68 mg/dL (ref <150)
VLDL: 14 mg/dL (ref 0–40)

## 2020-11-04 LAB — BASIC METABOLIC PANEL
Anion gap: 11 (ref 5–15)
BUN: 20 mg/dL (ref 8–23)
CO2: 25 mmol/L (ref 22–32)
Calcium: 9 mg/dL (ref 8.9–10.3)
Chloride: 103 mmol/L (ref 98–111)
Creatinine, Ser: 1.19 mg/dL (ref 0.61–1.24)
GFR, Estimated: >60 mL/min (ref >60)
Glucose, Bld: 112 mg/dL — ABNORMAL HIGH (ref 70–99)
Potassium: 4.1 mmol/L (ref 3.5–5.1)
Sodium: 139 mmol/L (ref 135–145)

## 2020-11-04 LAB — ECHOCARDIOGRAM COMPLETE
AR max vel: 1.75 cm2
AV Area VTI: 1.83 cm2
AV Area mean vel: 1.6 cm2
AV Mean grad: 4 mmHg
AV Peak grad: 5.7 mmHg
Ao pk vel: 1.19 m/s
Height: 66 in
S' Lateral: 4.75 cm
Weight: 3192.26 oz

## 2020-11-04 LAB — HEMOGLOBIN A1C
Hgb A1c MFr Bld: 5.6 % (ref 4.8–5.6)
Mean Plasma Glucose: 114.02 mg/dL

## 2020-11-04 LAB — BRAIN NATRIURETIC PEPTIDE: B Natriuretic Peptide: 710.8 pg/mL — ABNORMAL HIGH (ref 0.0–100.0)

## 2020-11-04 IMAGING — MR MR MRA HEAD W/O CM
1 series · 19 of 48 positions shown · non-contrast
Comparison: None.

CLINICAL DATA: Neuro deficit, acute, stroke suspected.

EXAM:
MRA HEAD WITHOUT CONTRAST
TECHNIQUE: Angiographic images of the Circle of Willis were obtained using MRA
technique without intravenous contrast.

[Series 5: TOF · axial · 0.5mm · 0.41mm/px · z∈[-60,+32]mm · 19 of 205 slices shown]
[im 1/205]
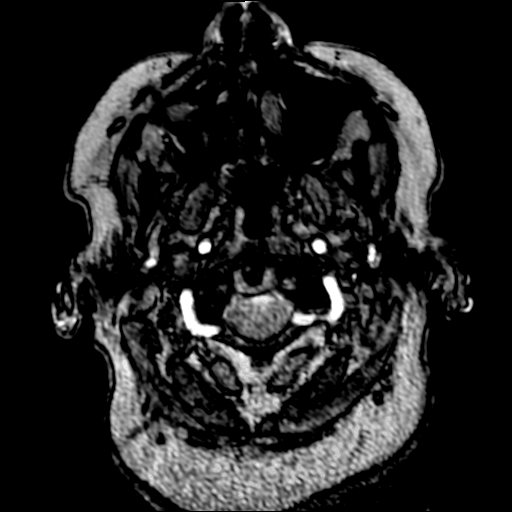
[im 5/205]
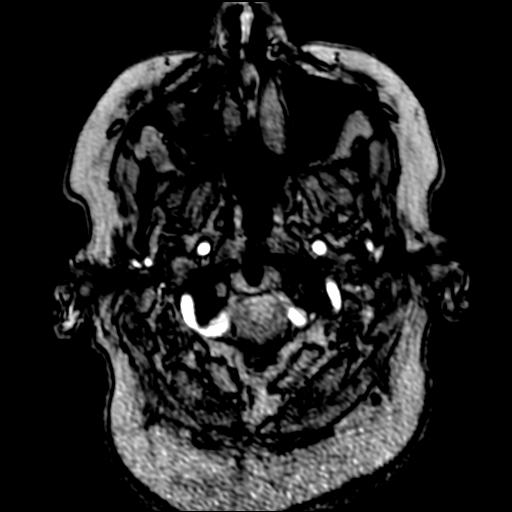
[im 9/205]
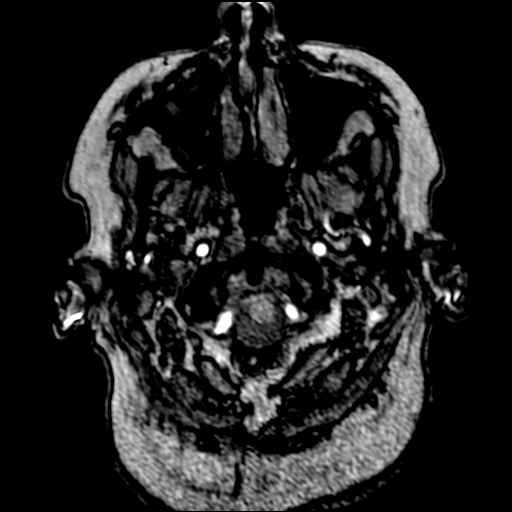
[im 14/205]
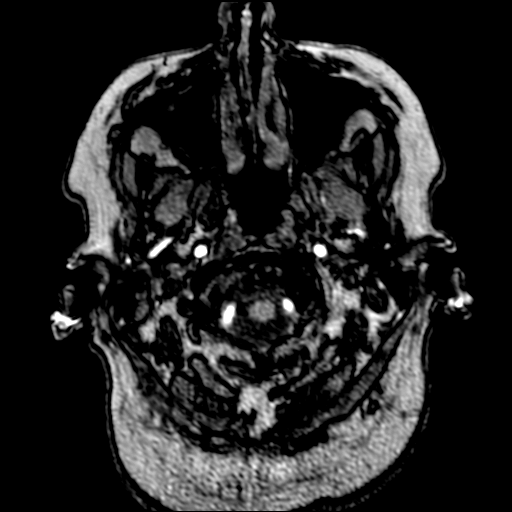
[im 18/205]
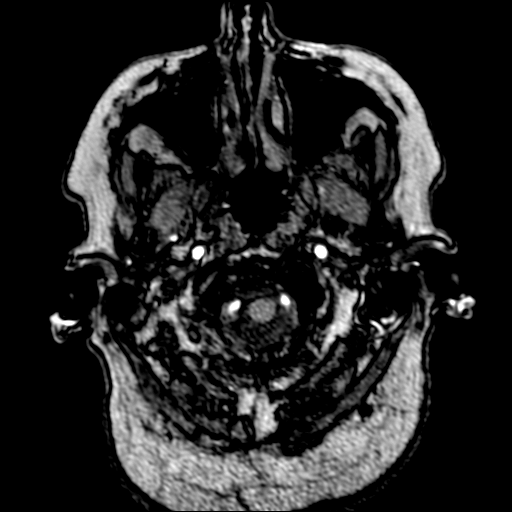
[im 22/205]
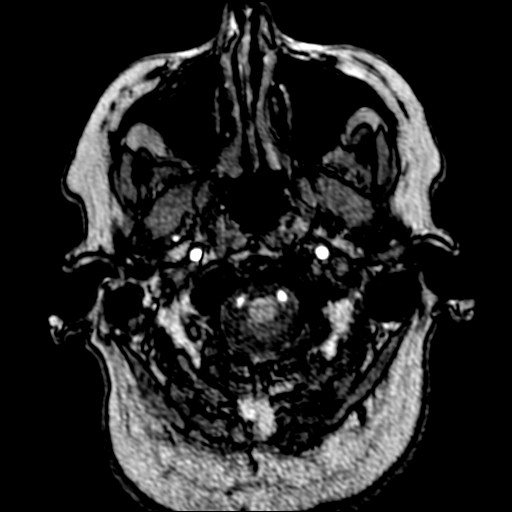
[im 27/205]
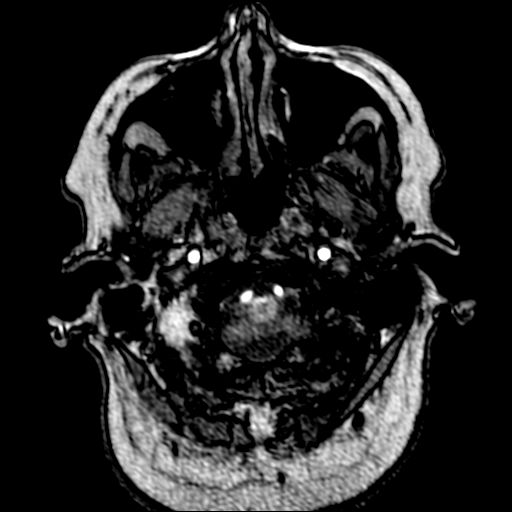
[im 31/205]
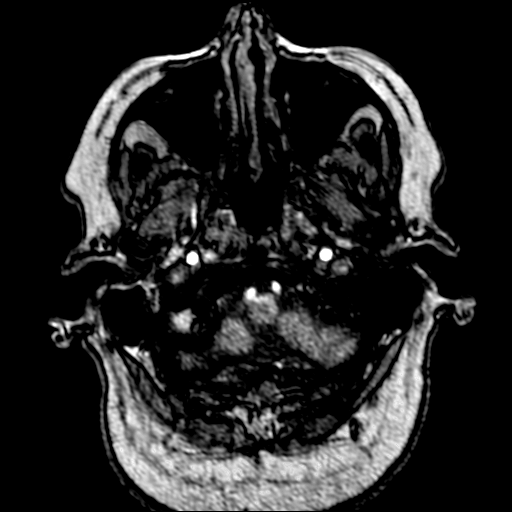
[im 35/205]
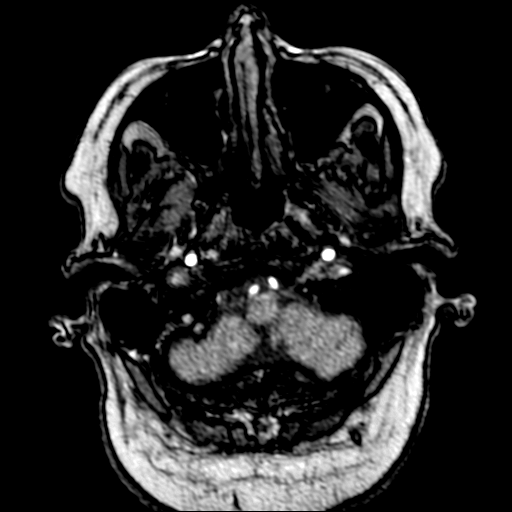
[im 40/205]
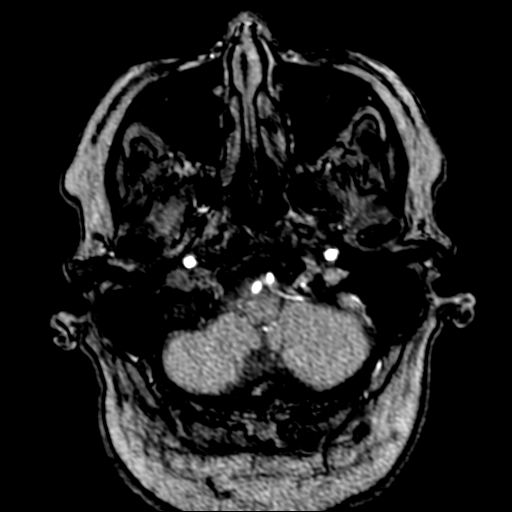
[im 44/205]
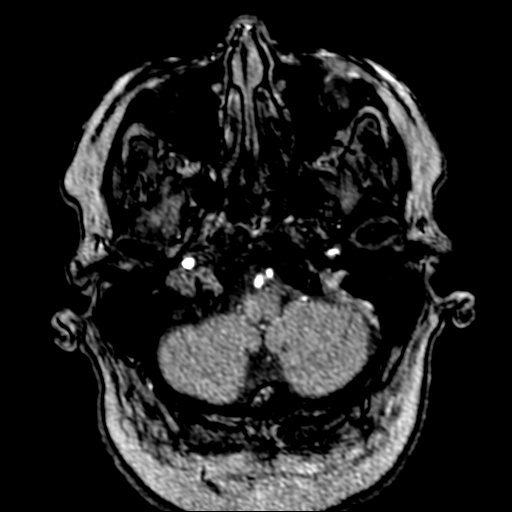
[im 66/205]
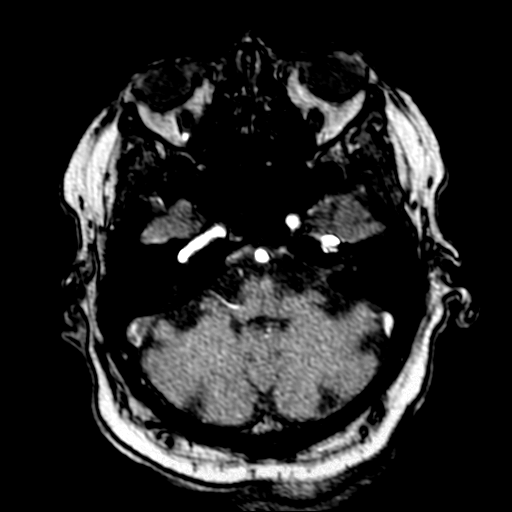
[im 92/205]
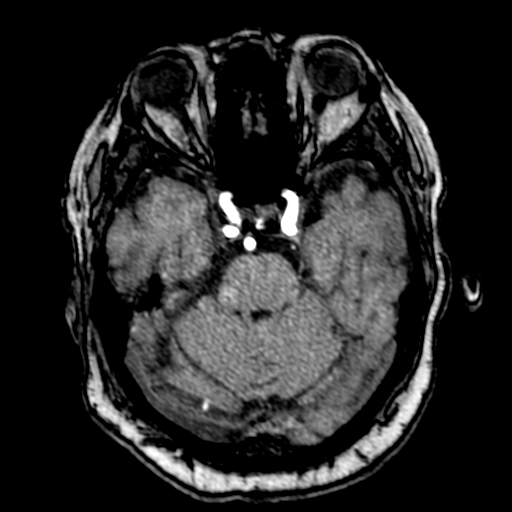
[im 105/205]
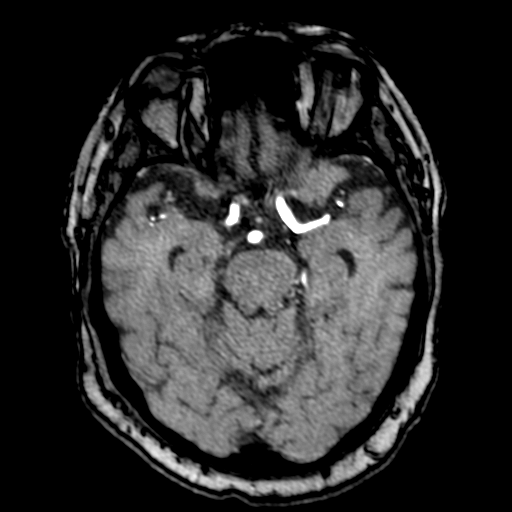
[im 118/205]
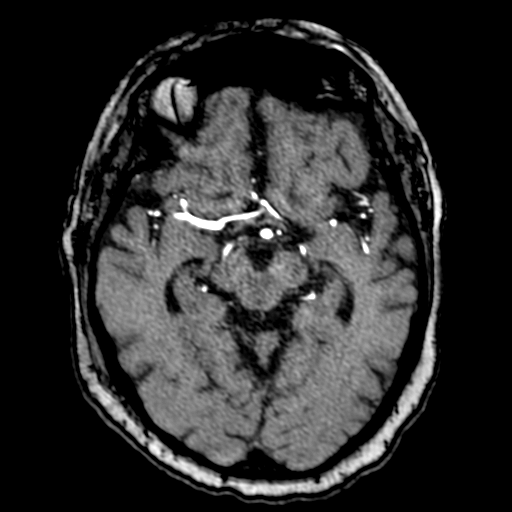
[im 144/205]
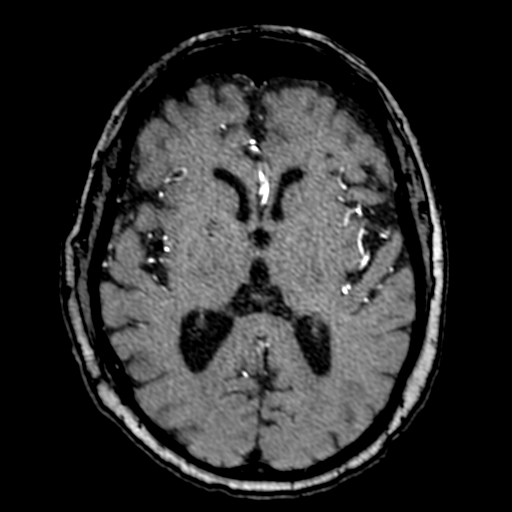
[im 170/205]
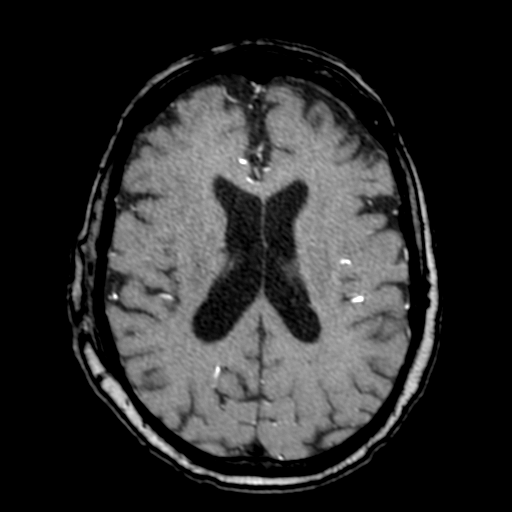
[im 174/205]
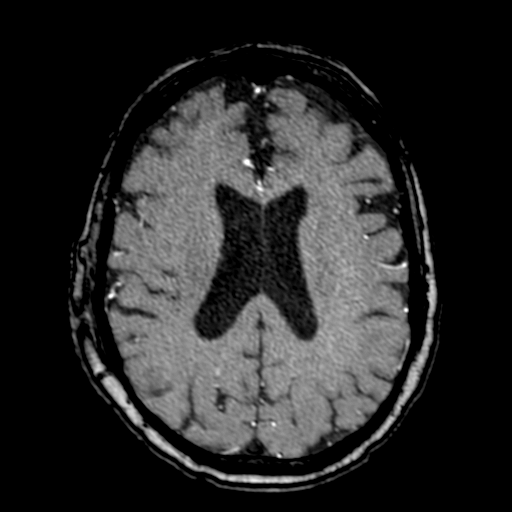
[im 196/205]
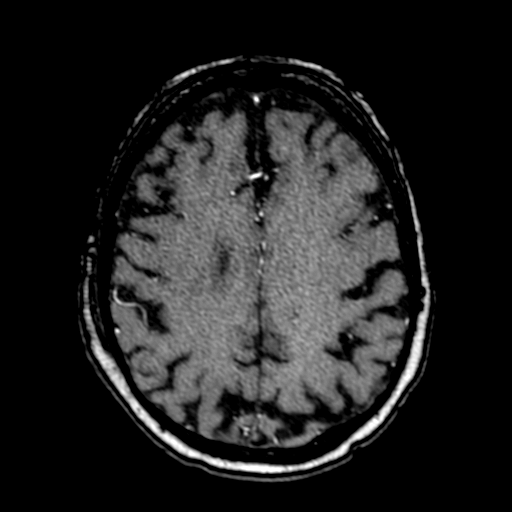

[19 of 48 positions shown; findings below may reference images not displayed]

FINDINGS: The visualized portions of the distal cervical and intracranial
internal carotid arteries are widely patent with normal flow related
enhancement. Short segment of severe stenosis in the petrous segment
of the bilateral ICA appear artifactual. The bilateral anterior
cerebral arteries and middle cerebral arteries are widely patent
with antegrade flow without high-grade flow-limiting stenosis or
proximal branch occlusion. No intracranial aneurysm within the
anterior circulation.

The vertebral arteries are widely patent with antegrade flow. The
posterior inferior cerebral arteries are normal. Vertebrobasilar
junction and basilar artery are widely patent with antegrade flow
without evidence of basilar stenosis or aneurysm. Posterior cerebral
arteries are normal bilaterally. No intracranial aneurysm within the
posterior circulation.
IMPRESSION: Unremarkable intracranial MRA.

## 2020-11-04 IMAGING — US US CAROTID DUPLEX BILAT
1 series · 15 of 24 positions shown · non-contrast
Comparison: None.

CLINICAL DATA: 79-year-old male with stroke-like symptoms

EXAM:
BILATERAL CAROTID DUPLEX ULTRASOUND
TECHNIQUE: Gray scale imaging, color Doppler and duplex ultrasound were
performed of bilateral carotid and vertebral arteries in the neck.

[Series 1: us carotid (id) · 15 of 62 slices shown]
[im 1/62]
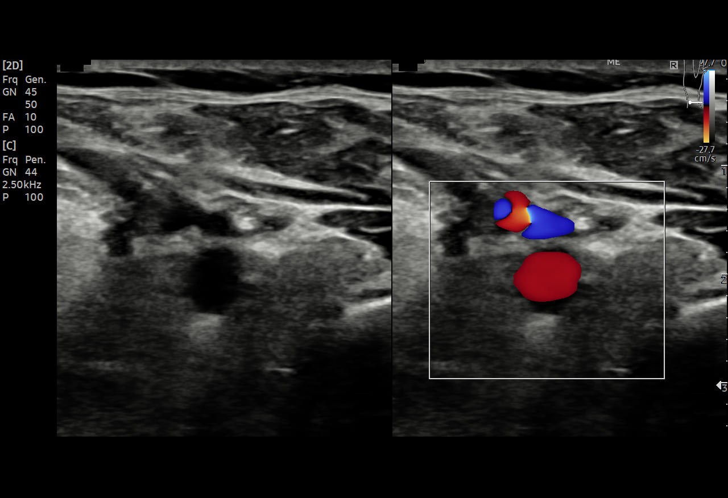
[im 6/62]
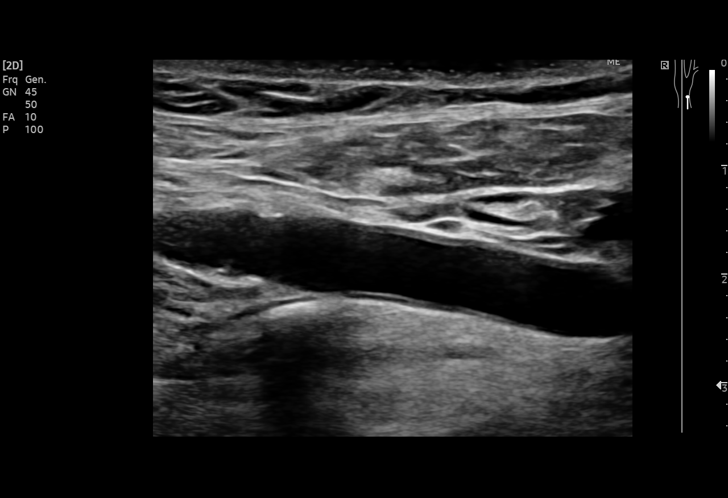
[im 11/62]
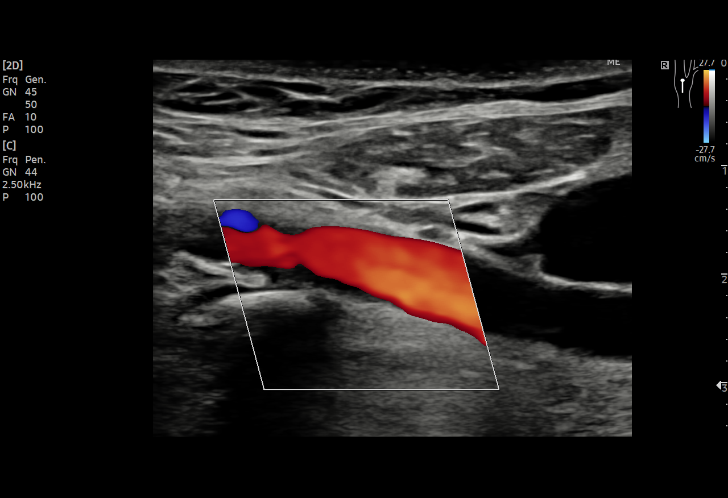
[im 14/62]
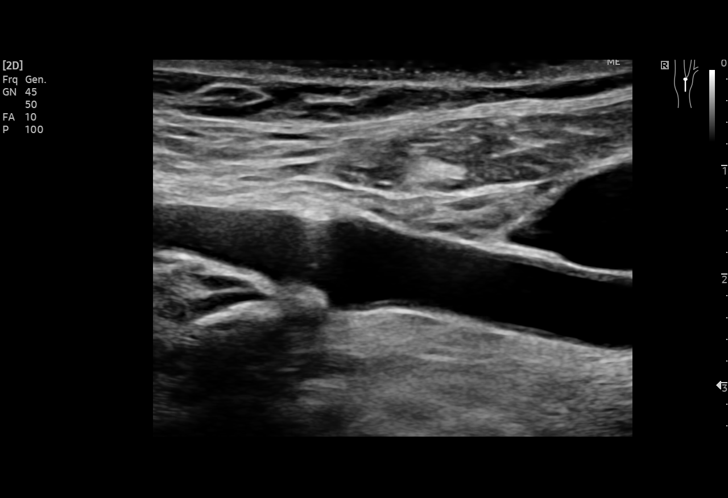
[im 19/62]
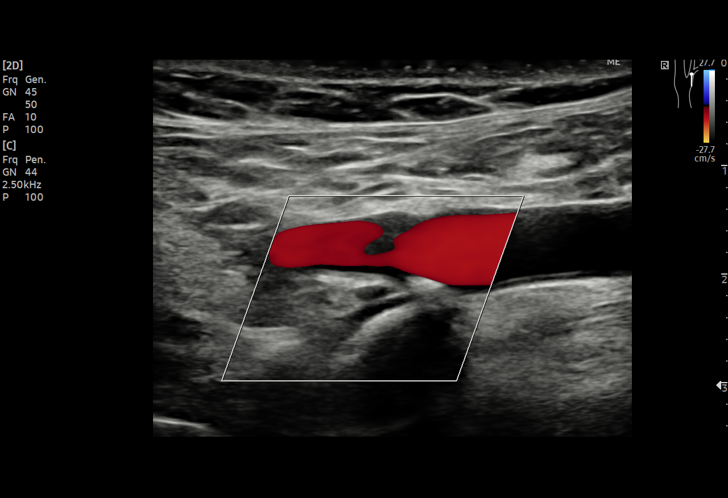
[im 22/62]
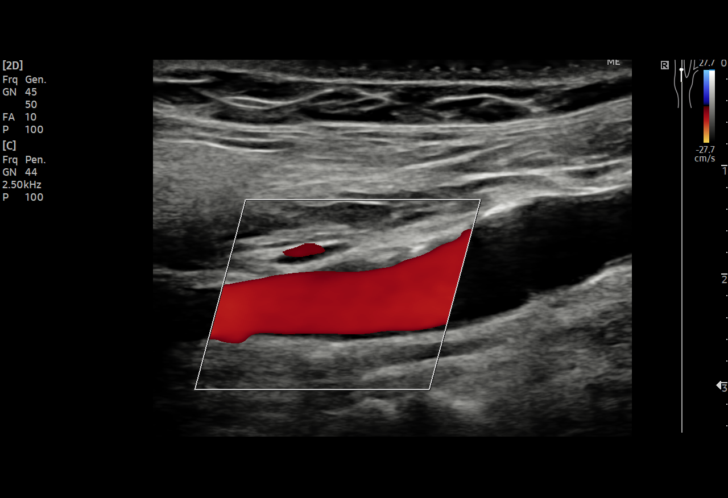
[im 27/62]
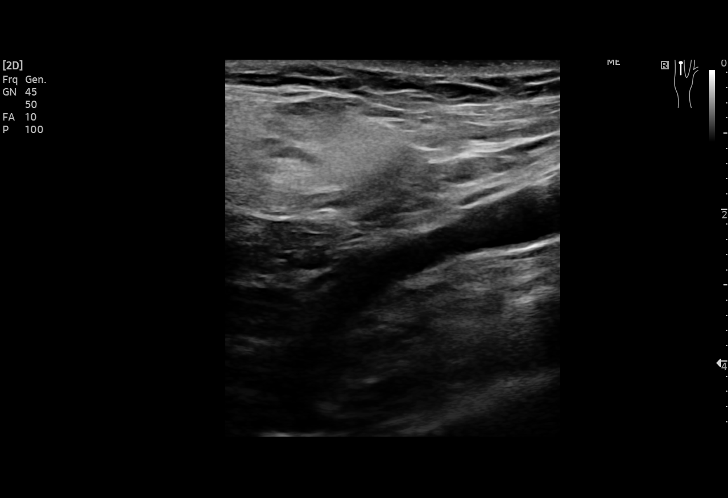
[im 32/62]
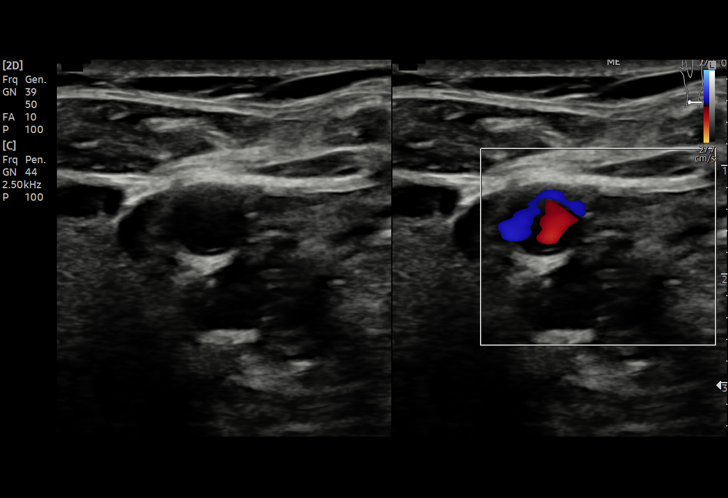
[im 35/62]
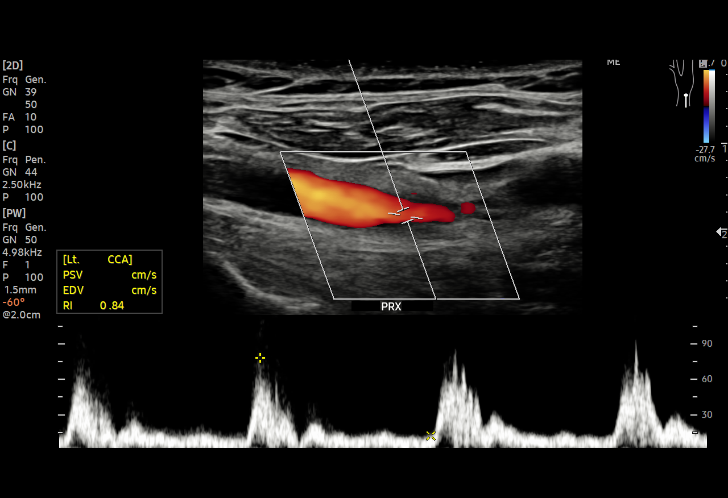
[im 40/62]
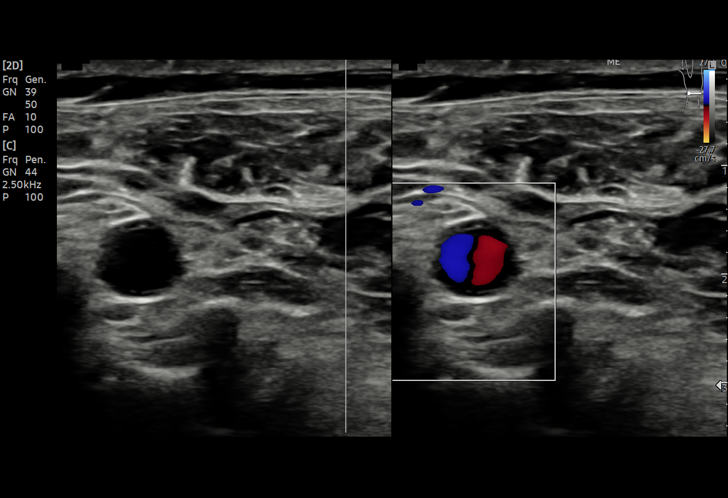
[im 43/62]
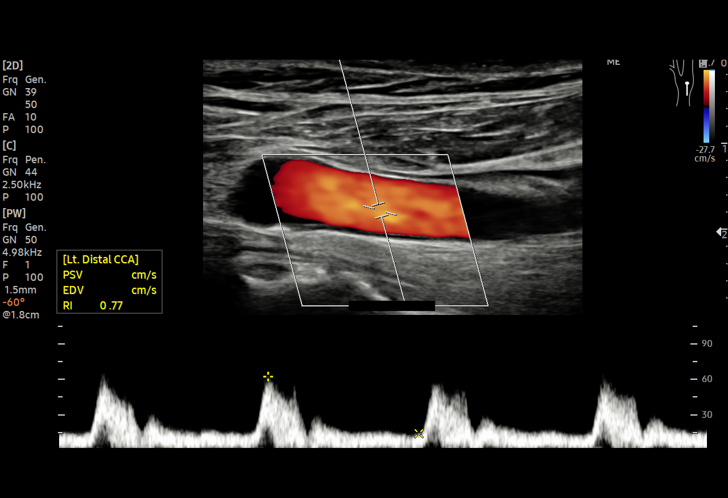
[im 48/62]
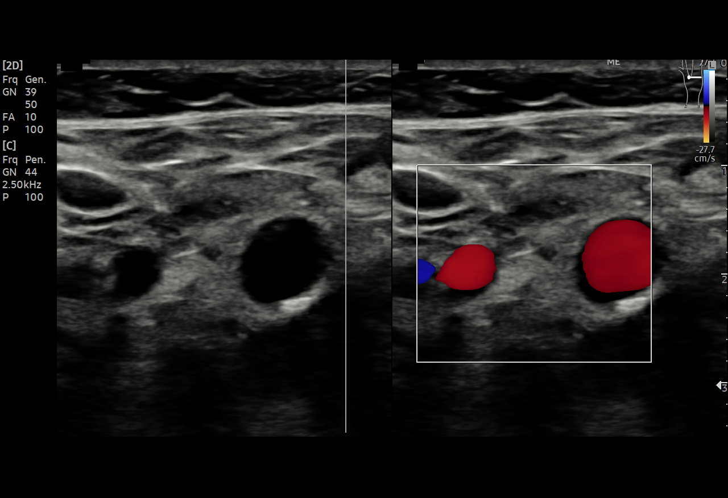
[im 54/62]
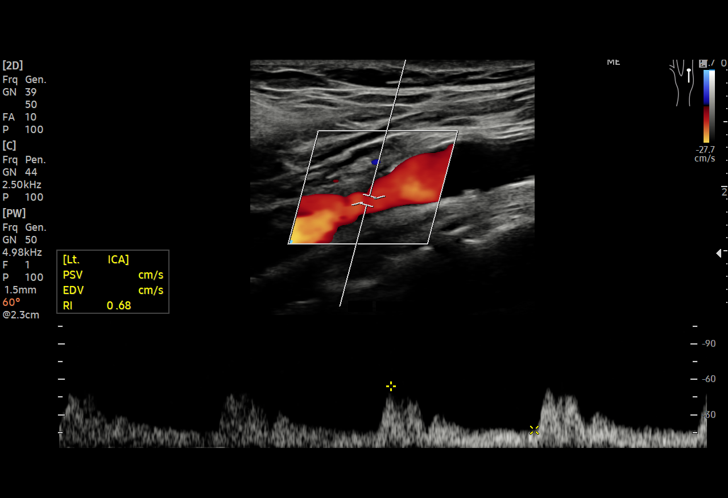
[im 56/62]
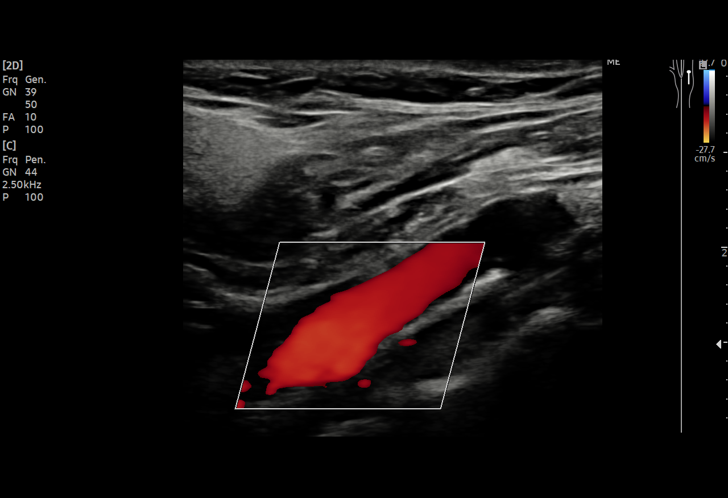
[im 62/62]
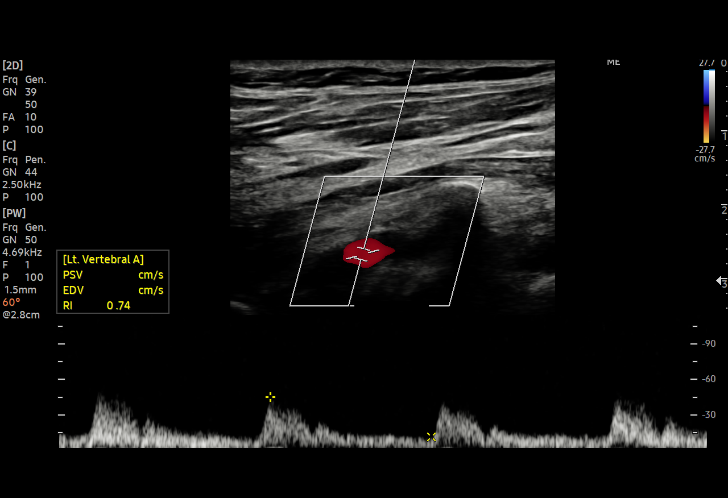

[15 of 24 positions shown; findings below may reference images not displayed]

FINDINGS: Criteria: Quantification of carotid stenosis is based on velocity
parameters that correlate the residual internal carotid diameter
with NASCET-based stenosis levels, using the diameter of the distal
internal carotid lumen as the denominator for stenosis measurement.

The following velocity measurements were obtained:

RIGHT
ICA: 97/32 cm/sec
CCA: 88/19 cm/sec

SYSTOLIC ICA/CCA RATIO:

ECA:  82 cm/sec

LEFT

ICA: 89/30 cm/sec

CCA: 80/17 cm/sec

SYSTOLIC ICA/CCA RATIO:  91

ECA:  1.1 cm/sec

RIGHT CAROTID ARTERY: Mild heterogeneous atherosclerotic plaque in
the proximal internal carotid artery. By peak systolic velocity
criteria, the estimated stenosis is less than 50%.

RIGHT VERTEBRAL ARTERY:  Patent with antegrade flow.

LEFT CAROTID ARTERY: Mild heterogeneous atherosclerotic plaque in
the proximal internal carotid artery. By peak systolic velocity
criteria, the estimated stenosis is less than 50%.

LEFT VERTEBRAL ARTERY:  Patent with antegrade flow.
IMPRESSION: 1. Mild (1-49%) stenosis proximal right internal carotid artery
secondary to mild heterogeneous atherosclerotic plaque.
2. Mild (1-49%) stenosis proximal left internal carotid artery
secondary to mild heterogeneous atherosclerotic plaque.
3. Vertebral arteries are patent with normal antegrade flow.

## 2020-11-04 MED ORDER — LOSARTAN POTASSIUM 25 MG PO TABS: 12.5000 mg | ORAL_TABLET | Freq: Every day | ORAL | 0 refills | Status: DC

## 2020-11-04 MED ORDER — CLOPIDOGREL BISULFATE 75 MG PO TABS: 75.0000 mg | ORAL_TABLET | Freq: Every day | ORAL | 0 refills | Status: AC

## 2020-11-04 MED ORDER — FUROSEMIDE 20 MG PO TABS
20.0000 mg | ORAL_TABLET | Freq: Every day | ORAL | 0 refills | Status: DC
Start: 2020-11-05 — End: 2021-01-10

## 2020-11-04 MED ORDER — METOPROLOL SUCCINATE ER 25 MG PO TB24
12.5000 mg | ORAL_TABLET | Freq: Every day | ORAL | 0 refills | Status: DC
Start: 2020-11-05 — End: 2021-03-14

## 2020-11-04 MED ORDER — ATORVASTATIN CALCIUM 40 MG PO TABS: 40.0000 mg | ORAL_TABLET | Freq: Every day | ORAL | 0 refills | Status: AC

## 2020-11-04 MED ORDER — METOPROLOL SUCCINATE ER 25 MG PO TB24: 12.5000 mg | ORAL_TABLET | Freq: Every day | ORAL | Status: DC

## 2020-11-04 MED ORDER — FUROSEMIDE 20 MG PO TABS
20.0000 mg | ORAL_TABLET | Freq: Every day | ORAL | Status: DC
  Administered 2020-11-04: 20 mg via ORAL
  Filled 2020-11-04: qty 1

## 2020-11-04 MED ORDER — ATORVASTATIN CALCIUM 20 MG PO TABS: 40.0000 mg | ORAL_TABLET | Freq: Every day | ORAL | Status: DC

## 2020-11-04 MED ORDER — ASPIRIN 81 MG PO TBEC: 81.0000 mg | DELAYED_RELEASE_TABLET | Freq: Every day | ORAL | 0 refills | Status: AC

## 2020-11-04 NOTE — Evaluation (Signed)
Physical Therapy Evaluation Patient Details Name: Gabriel Martin MRN: [id number] DOB: Sep 26, 1941 Today's Date: 11/04/2020   History of Present Illness  Gabriel Martin is a 79 y.o. male with medical history significant for hyperlipidemia, stage III chronic kidney disease, BPH, who is admitted to [facility] on 11/02/2020 with syncope after presenting from home to [facility] Emergency Department complaining of a single episode of loss of consciousness. Upon awakening, noted that he had difficulty with coordination.  Subsequent MRI brain without contrast demonstrated 2 left MCA strokes.  Clinical Impression  Pt received up in bathroom. Pt is able to ambulate around nurses station without AD & negotiate 7 steps with rails with supervision. No overt LOB noted during functional mobility & pt reports he feels he's at baseline level of function. No further PT needs at this time.    Follow Up Recommendations  No further PT Needs    Equipment Recommendations  None recommended by PT    Recommendations for Other Services       Precautions / Restrictions Precautions Precautions: Fall Restrictions Weight Bearing Restrictions: No      Mobility  Bed Mobility Overal bed mobility: Modified Independent             General bed mobility comments: sit>supine with bed rails, HOB slightly elevated    Transfers Overall transfer level: Independent                  Ambulation/Gait Ambulation/Gait assistance: Independent Gait Distance (Feet): 250 Feet Assistive device: None Gait Pattern/deviations: WFL(Within Functional Limits) Gait velocity: decreased      Stairs Stairs: Yes Stairs assistance: Supervision Stair Management: One rail Right;Two rails Number of Stairs: 7 (6") General stair comments: step to to descend, step over step to ascend  Wheelchair Mobility    Modified Rankin (Stroke Patients Only)       Balance Overall balance assessment: Mild  deficits observed, not formally tested           Standing balance-Leahy Scale: Good Standing balance comment: ambulates without AD without UE support without LOB                             Pertinent Vitals/Pain Pain Assessment: No/denies pain    Home Living Family/patient expects to be discharged to:: Private residence Living Arrangements: Spouse/significant other Available Help at Discharge: Family Type of Home: House Home Access: Stairs to enter Entrance Stairs-Rails: Right;Left;Can reach both Technical brewer of Steps: 2 Home Layout: One level Home Equipment: Grab bars - toilet;Grab bars - tub/shower      Prior Function Level of Independence: Independent               Hand Dominance        Extremity/Trunk Assessment   Upper Extremity Assessment Upper Extremity Assessment: Overall WFL for tasks assessed    Lower Extremity Assessment Lower Extremity Assessment: Overall WFL for tasks assessed       Communication   Communication: No difficulties  Cognition Arousal/Alertness: Awake/alert Behavior During Therapy: WFL for tasks assessed/performed Overall Cognitive Status: Within Functional Limits for tasks assessed                                        General Comments General comments (skin integrity, edema, etc.): SpO2 >90% on room air throughout session    Exercises  Assessment/Plan    PT Assessment Patent does not need any further PT services  PT Problem List         PT Treatment Interventions      PT Goals (Current goals can be found in the Care Plan section)  Acute Rehab PT Goals Patient Stated Goal: to go home PT Goal Formulation: With patient Time For Goal Achievement: 11/18/20 Potential to Achieve Goals: Good    Frequency     Barriers to discharge        Co-evaluation               AM-PAC PT "6 Clicks" Mobility  Outcome Measure Help needed turning from your back to your side while  in a flat bed without using bedrails?: None Help needed moving from lying on your back to sitting on the side of a flat bed without using bedrails?: None Help needed moving to and from a bed to a chair (including a wheelchair)?: None Help needed standing up from a chair using your arms (e.g., wheelchair or bedside chair)?: None Help needed to walk in hospital room?: None Help needed climbing 3-5 steps with a railing? : None 6 Click Score: 24    End of Session Equipment Utilized During Treatment: Gait belt Activity Tolerance: Patient tolerated treatment well Patient left: in bed;with family/visitor present;with call bell/phone within reach Nurse Communication: Mobility status      Time: 1607-3710 PT Time Calculation (min) (ACUTE ONLY): 9 min   Charges:   PT Evaluation $PT Eval Low Complexity: 1 Low          Gabriel Martin, PT, DPT 11/04/20, 2:40 PM   Gabriel Martin 11/04/2020, 2:40 PM

## 2020-11-04 NOTE — Discharge Summary (Addendum)
[facility] NAME: Gabriel Martin    MR#:  [id number]  DATE OF BIRTH:  01/16/1941  DATE OF ADMISSION:  11/02/2020 ADMITTING PHYSICIAN: Loletha Grayer, MD  DATE OF DISCHARGE: 11/04/2020  PRIMARY CARE PHYSICIAN: [name], MD    ADMISSION DIAGNOSIS:  Syncope [R55] Frequent PVCs [I49.3] Syncope, unspecified syncope type [R55]  DISCHARGE DIAGNOSIS:  Principal Problem:   Syncope Active Problems:   Acid reflux   PVC's (premature ventricular contractions)   BPH (benign prostatic hyperplasia)   CKD (chronic kidney disease), stage III (HCC)   Elevated troponin   Cardiomegaly   SECONDARY DIAGNOSIS:   Past Medical History:  Diagnosis Date  . Acid reflux 05/12/2015  . Anemia   . Arthritis    finger thumb  . Benign enlargement of prostate   . Benign neoplasm of large bowel   . Benign prostatic hyperplasia with urinary obstruction 05/12/2015  . BPH (benign prostatic hyperplasia)   . BPH with obstruction/lower urinary tract symptoms 05/12/2015  . Change in blood platelet count 05/12/2015  . Chronic kidney disease 05/12/2015  . GERD (gastroesophageal reflux disease)   . HLD (hyperlipidemia) 05/12/2015  . Hyperlipidemia   . Nocturia associated with benign prostatic hypertrophy   . Penile ulcer 05/12/2015  . Skin cancer   . Thrombocytopenia ([place])     HOSPITAL COURSE:   1.  Acute embolic CVA.  The patient initially presented after syncope episode but did tell me that he had an episode of confusion where he turned over her drink on the table.  When he went to pay he was having trouble coordinating his hands in order to pay.  The patient was started on an aspirin.  MRI of the brain showed 2 acute left MCA territory cortical infarcts with 1 measuring 15 mm within the left parietal lobe and the other within the left frontal lobe motor strip.  Plavix was added.  Pravastatin was switched over to atorvastatin.  Carotid ultrasound did  show some mild plaque.  Echocardiogram did show an EF of 30%.  Telemetry monitoring did show a few PVCs but was in sinus rhythm.  The patient will continue aspirin indefinitely.  He will have Plavix for 21 days.  LDL 75.  Neurology did recommend a TEE and telemetry monitoring as outpatient.  Case discussed with Dr. Rockey Situ who read the echocardiogram.  Patient did well with physical therapy and occupational therapy and no needs as outpatient. 2.  Cardiomyopathy with shortness of breath.  Acute on chronic systolic congestive heart failure. I did give a dose of IV Lasix on 11/03/2020.  Upon discharge I did give Lasix 20 mg daily, Toprol-XL 12.5 mg daily and losartan 12.5 mg nightly.  Will have patient follow-up with the CHF clinic and Dr. Rockey Situ as outpatient. With his acute stroke I do not want blood pressure to drop down to low with too many medications all at once.  Can consider low-dose spironolactone as outpatient.  Can consider Wilder Glade also as outpatient. 3.  Syncope likely secondary to stroke. 4.  Hyperlipidemia unspecified.  Pravastatin switched over to atorvastatin.  LDL 75. 5.  Neuropathy on gabapentin 6.  Chronic thrombocytopenia 7.  Chronic kidney disease stage II.  Creatinine 1.19 with GFR greater than 60. 8.  GERD on H2 blocker  DISCHARGE CONDITIONS:   Satisfactory  CONSULTS OBTAINED:  Neurology  DRUG ALLERGIES:  No Known Allergies  DISCHARGE MEDICATIONS:   Allergies as of 11/04/2020   No Known Allergies  Medication List    STOP taking these medications   lovastatin 20 MG tablet Commonly known as: MEVACOR   ranitidine 75 MG tablet Commonly known as: ZANTAC     TAKE these medications   acetaminophen 500 MG tablet Commonly known as: TYLENOL Take 500 mg by mouth every 6 (six) hours as needed.   aspirin 81 MG EC tablet Take 1 tablet (81 mg total) by mouth daily. Swallow whole. Start taking on: November 05, 2020   atorvastatin 40 MG tablet Commonly known as:  LIPITOR Take 1 tablet (40 mg total) by mouth daily at 6 PM.   clopidogrel 75 MG tablet Commonly known as: PLAVIX Take 1 tablet (75 mg total) by mouth daily for 21 doses. Start taking on: November 05, 2020   famotidine 20 MG tablet Commonly known as: PEPCID Take 20 mg by mouth daily.   furosemide 20 MG tablet Commonly known as: LASIX Take 1 tablet (20 mg total) by mouth daily. Start taking on: November 05, 2020   gabapentin 100 MG capsule Commonly known as: NEURONTIN Take 1 capsule by mouth in the morning and at bedtime.   losartan 25 MG tablet Commonly known as: Cozaar Take 0.5 tablets (12.5 mg total) by mouth at bedtime.   metoprolol succinate 25 MG 24 hr tablet Commonly known as: TOPROL-XL Take 0.5 tablets (12.5 mg total) by mouth daily. Start taking on: November 05, 2020        DISCHARGE INSTRUCTIONS:   Follow-up PCP 5 days Follow-up Dr. Rockey Situ cardiology 1 week Follow-up Dr. Antony Contras neurology in a couple weeks.  If you experience worsening of your admission symptoms, develop shortness of breath, life threatening emergency, suicidal or homicidal thoughts you must seek medical attention immediately by calling 911 or calling your MD immediately  if symptoms less severe.  You Must read complete instructions/literature along with all the possible adverse reactions/side effects for all the Medicines you take and that have been prescribed to you. Take any new Medicines after you have completely understood and accept all the possible adverse reactions/side effects.   Please note  You were cared for by a hospitalist during your hospital stay. If you have any questions about your discharge medications or the care you received while you were in the hospital after you are discharged, you can call the unit and asked to speak with the hospitalist on call if the hospitalist that took care of you is not available. Once you are discharged, your primary care physician will handle  any further medical issues. Please note that NO REFILLS for any discharge medications will be authorized once you are discharged, as it is imperative that you return to your primary care physician (or establish a relationship with a primary care physician if you do not have one) for your aftercare needs so that they can reassess your need for medications and monitor your lab values.    Today   CHIEF COMPLAINT:   Chief Complaint  Patient presents with  . Loss of Consciousness    HISTORY OF PRESENT ILLNESS:  Gabriel Martin  is a 79 y.o. male came in with a syncopal episode   VITAL SIGNS:  Blood pressure (!) 142/96, pulse 73, temperature 97.9 F (36.6 C), resp. rate 16, height 5\' 6"  (1.676 m), weight 90.5 kg, SpO2 97 %.  I/O:    Intake/Output Summary (Last 24 hours) at 11/04/2020 1650 Last data filed at 11/04/2020 1437 Gross per 24 hour  Intake 720 ml  Output --  Net 720 ml    PHYSICAL EXAMINATION:  GENERAL:  79 y.o.-year-old patient lying in the bed with no acute distress.  EYES: Pupils equal, round, reactive to light and accommodation. No scleral icterus. HEENT: Head atraumatic, normocephalic. Oropharynx and nasopharynx clear.   LUNGS: Normal breath sounds bilaterally, no wheezing, rales,rhonchi or crepitation. No use of accessory muscles of respiration.  CARDIOVASCULAR: S1, S2 normal. No murmurs, rubs, or gallops.  ABDOMEN: Soft, non-tender, non-distended.  EXTREMITIES: No pedal edema.  NEUROLOGIC: Cranial nerves II through XII are intact. Muscle strength 5/5 in all extremities. Sensation intact. Gait not checked.  PSYCHIATRIC: The patient is alert and oriented x 3.  SKIN: Left hand abrasion, right side of the head abrasion.  DATA REVIEW:   CBC Recent Labs  Lab 11/03/20 0631  WBC 5.6  HGB 13.8  HCT 41.6  PLT 101*    Chemistries  Recent Labs  Lab 11/03/20 0631 11/04/20 0526  NA 139 139  K 4.3 4.1  CL 106 103  CO2 25 25  GLUCOSE 112* 112*  BUN 22 20   CREATININE 1.24 1.19  CALCIUM 9.2 9.0  MG 2.2  --   AST 19  --   ALT 24  --   ALKPHOS 39  --   BILITOT 1.3*  --     Microbiology Results  Results for orders placed or performed during the hospital encounter of 11/02/20  Resp Panel by RT-PCR (Flu A&B, Covid) Nasopharyngeal Swab     Status: None   Collection Time: 11/02/20  8:13 PM   Specimen: Nasopharyngeal Swab; Nasopharyngeal(NP) swabs in vial transport medium  Result Value Ref Range Status   SARS Coronavirus 2 by RT PCR NEGATIVE NEGATIVE Final    Comment: (NOTE) SARS-CoV-2 target nucleic acids are NOT DETECTED.  The SARS-CoV-2 RNA is generally detectable in upper respiratory specimens during the acute phase of infection. The lowest concentration of SARS-CoV-2 viral copies this assay can detect is 138 copies/mL. A negative result does not preclude SARS-Cov-2 infection and should not be used as the sole basis for treatment or other patient management decisions. A negative result may occur with  improper specimen collection/handling, submission of specimen other than nasopharyngeal swab, presence of viral mutation(s) within the areas targeted by this assay, and inadequate number of viral copies(<138 copies/mL). A negative result must be combined with clinical observations, patient history, and epidemiological information. The expected result is Negative.  Fact Sheet for Patients:  EntrepreneurPulse.com.au  Fact Sheet for Healthcare Providers:  IncredibleEmployment.be  This test is no t yet approved or cleared by the [place] FDA and  has been authorized for detection and/or diagnosis of SARS-CoV-2 by FDA under an Emergency Use Authorization (EUA). This EUA will remain  in effect (meaning this test can be used) for the duration of the COVID-19 declaration under Section 564(b)(1) of the Act, 21 U.S.C.section 360bbb-3(b)(1), unless the authorization is terminated  or revoked sooner.        Influenza A by PCR NEGATIVE NEGATIVE Final   Influenza B by PCR NEGATIVE NEGATIVE Final    Comment: (NOTE) The Xpert Xpress SARS-CoV-2/FLU/RSV plus assay is intended as an aid in the diagnosis of influenza from Nasopharyngeal swab specimens and should not be used as a sole basis for treatment. Nasal washings and aspirates are unacceptable for Xpert Xpress SARS-CoV-2/FLU/RSV testing.  Fact Sheet for Patients: EntrepreneurPulse.com.au  Fact Sheet for Healthcare Providers: IncredibleEmployment.be  This test is not yet approved or cleared by the [place] FDA and has  been authorized for detection and/or diagnosis of SARS-CoV-2 by FDA under an Emergency Use Authorization (EUA). This EUA will remain in effect (meaning this test can be used) for the duration of the COVID-19 declaration under Section 564(b)(1) of the Act, 21 U.S.C. section 360bbb-3(b)(1), unless the authorization is terminated or revoked.  Performed at [facility], [address] [place], [place] [address]     RADIOLOGY:  CT HEAD WO CONTRAST  Result Date: 11/02/2020 CLINICAL DATA:  Head trauma, loss of consciousness. Laceration to right back side of head. EXAM: CT HEAD WITHOUT CONTRAST CT CERVICAL SPINE WITHOUT CONTRAST TECHNIQUE: Multidetector CT imaging of the head and cervical spine was performed following the standard protocol without intravenous contrast. Multiplanar CT image reconstructions of the cervical spine were also generated. COMPARISON:  None. FINDINGS: CT HEAD FINDINGS Brain: No evidence of large-territorial acute infarction. No parenchymal hemorrhage. No mass lesion. No extra-axial collection. No mass effect or midline shift. No hydrocephalus. Basilar cisterns are patent. Vascular: No hyperdense vessel. Skull: No acute fracture or focal lesion. Sinuses/Orbits: Paranasal sinuses and mastoid air cells are clear. The orbits are unremarkable. Other: Mild  right scalp subcutaneus soft tissue edema. CT CERVICAL SPINE FINDINGS Alignment: Normal. Skull base and vertebrae: No acute fracture. Multilevel mild moderate degenerative changes of the spine. No aggressive appearing focal osseous lesion or focal pathologic process. Soft tissues and spinal canal: No prevertebral fluid or swelling. No visible canal hematoma. Disc levels:  Mild intervertebral disc space narrowing. Upper chest: Unremarkable. Other: None. IMPRESSION: 1. No acute intracranial abnormality. 2. No acute displaced fracture or traumatic listhesis of the cervical spine. Electronically Signed   By: Iven Finn M.D.   On: 11/02/2020 19:43   CT Cervical Spine Wo Contrast  Result Date: 11/02/2020 CLINICAL DATA:  Head trauma, loss of consciousness. Laceration to right back side of head. EXAM: CT HEAD WITHOUT CONTRAST CT CERVICAL SPINE WITHOUT CONTRAST TECHNIQUE: Multidetector CT imaging of the head and cervical spine was performed following the standard protocol without intravenous contrast. Multiplanar CT image reconstructions of the cervical spine were also generated. COMPARISON:  None. FINDINGS: CT HEAD FINDINGS Brain: No evidence of large-territorial acute infarction. No parenchymal hemorrhage. No mass lesion. No extra-axial collection. No mass effect or midline shift. No hydrocephalus. Basilar cisterns are patent. Vascular: No hyperdense vessel. Skull: No acute fracture or focal lesion. Sinuses/Orbits: Paranasal sinuses and mastoid air cells are clear. The orbits are unremarkable. Other: Mild right scalp subcutaneus soft tissue edema. CT CERVICAL SPINE FINDINGS Alignment: Normal. Skull base and vertebrae: No acute fracture. Multilevel mild moderate degenerative changes of the spine. No aggressive appearing focal osseous lesion or focal pathologic process. Soft tissues and spinal canal: No prevertebral fluid or swelling. No visible canal hematoma. Disc levels:  Mild intervertebral disc space narrowing.  Upper chest: Unremarkable. Other: None. IMPRESSION: 1. No acute intracranial abnormality. 2. No acute displaced fracture or traumatic listhesis of the cervical spine. Electronically Signed   By: Iven Finn M.D.   On: 11/02/2020 19:43   MR ANGIO HEAD WO CONTRAST  Result Date: 11/04/2020 CLINICAL DATA:  Neuro deficit, acute, stroke suspected. EXAM: MRA HEAD WITHOUT CONTRAST TECHNIQUE: Angiographic images of the Circle of Willis were obtained using MRA technique without intravenous contrast. COMPARISON:  None. FINDINGS: The visualized portions of the distal cervical and intracranial internal carotid arteries are widely patent with normal flow related enhancement. Short segment of severe stenosis in the petrous segment of the bilateral ICA appear artifactual. The bilateral anterior cerebral arteries  and middle cerebral arteries are widely patent with antegrade flow without high-grade flow-limiting stenosis or proximal branch occlusion. No intracranial aneurysm within the anterior circulation. The vertebral arteries are widely patent with antegrade flow. The posterior inferior cerebral arteries are normal. Vertebrobasilar junction and basilar artery are widely patent with antegrade flow without evidence of basilar stenosis or aneurysm. Posterior cerebral arteries are normal bilaterally. No intracranial aneurysm within the posterior circulation. IMPRESSION: Unremarkable intracranial MRA. Electronically Signed   By: Pedro Earls M.D.   On: 11/04/2020 14:24   MR BRAIN WO CONTRAST  Result Date: 11/03/2020 CLINICAL DATA:  Provided history: Syncope, recurrent. EXAM: MRI HEAD WITHOUT CONTRAST TECHNIQUE: Multiplanar, multiecho pulse sequences of the brain and surrounding structures were obtained without intravenous contrast. COMPARISON:  CT head/cervical spine 11/02/2020. FINDINGS: Brain: Mild cerebral and cerebellar atrophy. 15 mm focus of restricted diffusion within the left parietal cortex  consistent with acute infarction (series 5, image 32) (series 7, images 51-54). Additional punctate acute cortical infarct within the posterior left frontal lobe (motor strip)(series 5, image 39). Mild multifocal T2/FLAIR hyperintensity within the cerebral white matter is nonspecific, but compatible with chronic small vessel ischemic disease. This includes a chronic lacunar infarct within the anterior left frontal lobe white matter (series 15, image 29). No evidence of intracranial mass. No chronic intracranial blood products. No extra-axial fluid collection. No midline shift. Vascular: Expected proximal arterial flow voids. Skull and upper cervical spine: No focal marrow lesion. Sinuses/Orbits: Visualized orbits show no acute finding. Mild right ethmoid and maxillary sinus mucosal thickening. Other: Nonspecific edema within the right temporoparietal scalp. Impression #1 below will be called to the ordering clinician or representative by the Radiologist Assistant, and communication documented in the PACS or [employer]. IMPRESSION: Two acute left MCA territory cortical infarcts, one measuring 15 mm within the left parietal lobe and the other punctate within the left frontal lobe motor strip. Background mild parenchymal and chronic small vessel ischemic disease. Nonspecific edema within the right temporoparietal scalp. Mild right ethmoid and maxillary sinus mucosal thickening. Electronically Signed   By: Kellie Simmering DO   On: 11/03/2020 17:42   US Carotid Bilateral  Result Date: 11/04/2020 CLINICAL DATA:  79 year old male with stroke-like symptoms EXAM: BILATERAL CAROTID DUPLEX ULTRASOUND TECHNIQUE: Pearline Cables scale imaging, color Doppler and duplex ultrasound were performed of bilateral carotid and vertebral arteries in the neck. COMPARISON:  None. FINDINGS: Criteria: Quantification of carotid stenosis is based on velocity parameters that correlate the residual internal carotid diameter with NASCET-based  stenosis levels, using the diameter of the distal internal carotid lumen as the denominator for stenosis measurement. The following velocity measurements were obtained: RIGHT ICA: 97/32 cm/sec CCA: 31/59 cm/sec SYSTOLIC ICA/CCA RATIO:  1.1 ECA:  82 cm/sec LEFT ICA: 89/30 cm/sec CCA: 45/85 cm/sec SYSTOLIC ICA/CCA RATIO:  91 ECA:  1.1 cm/sec RIGHT CAROTID ARTERY: Mild heterogeneous atherosclerotic plaque in the proximal internal carotid artery. By peak systolic velocity criteria, the estimated stenosis is less than 50%. RIGHT VERTEBRAL ARTERY:  Patent with antegrade flow. LEFT CAROTID ARTERY: Mild heterogeneous atherosclerotic plaque in the proximal internal carotid artery. By peak systolic velocity criteria, the estimated stenosis is less than 50%. LEFT VERTEBRAL ARTERY:  Patent with antegrade flow. IMPRESSION: 1. Mild (1-49%) stenosis proximal right internal carotid artery secondary to mild heterogeneous atherosclerotic plaque. 2. Mild (1-49%) stenosis proximal left internal carotid artery secondary to mild heterogeneous atherosclerotic plaque. 3. Vertebral arteries are patent with normal antegrade flow. Signed, Criselda Peaches, MD, [facility] Electronically Signed   By: Jacqulynn Cadet M.D.   On: 11/04/2020 10:32   Portable chest 1 View  Result Date: 11/02/2020 CLINICAL DATA:  Syncopal episode EXAM: PORTABLE CHEST 1 VIEW COMPARISON:  None. FINDINGS: Cardiomegaly without overt pulmonary edema. No pleural effusion. Aortic atherosclerosis. No pneumothorax. IMPRESSION: Cardiomegaly. Electronically Signed   By: Donavan Foil M.D.   On: 11/02/2020 22:11   ECHOCARDIOGRAM COMPLETE  Result Date: 11/04/2020    ECHOCARDIOGRAM REPORT   Patient Name:   Gabriel Martin Date of Exam: 11/04/2020 Medical Rec #:  [id number]          Height:       66.0 in Accession #:    [id number]         Weight:       199.5 lb Date of Birth:  Mar 22, 1941          BSA:           1.998 m Patient Age:    8 years           BP:           134/95 mmHg Patient Gender: M                  HR:           74 bpm. Exam Location:  [facility] Procedure: 2D Echo Indications:     Syncope 780.2  History:         Patient has no prior history of Echocardiogram examinations.                  Stroke; Arrythmias:PVC.  Sonographer:     L Thornton-Maynard Referring Phys:  [id number] Loletha Grayer Diagnosing Phys: Ida Rogue MD IMPRESSIONS  1. Left ventricular ejection fraction, by estimation, is 30 to 35%. The left ventricle has moderately decreased function. The left ventricle demonstrates global hypokinesis. Left ventricular diastolic parameters are consistent with Grade II diastolic dysfunction (pseudonormalization).  2. Right ventricular systolic function is mildly reduced. The right ventricular size is normal. There is moderately elevated pulmonary artery systolic pressure. The estimated right ventricular systolic pressure is 93.2 mmHg.  3. Left atrial size was mildly dilated. FINDINGS  Left Ventricle: Left ventricular ejection fraction, by estimation, is 30 to 35%. The left ventricle has moderately decreased function. The left ventricle demonstrates global hypokinesis. The left ventricular internal cavity size was normal in size. There is no left ventricular hypertrophy. Left ventricular diastolic parameters are consistent with Grade II diastolic dysfunction (pseudonormalization). Right Ventricle: The right ventricular size is normal. No increase in right ventricular wall thickness. Right ventricular systolic function is mildly reduced. There is moderately elevated pulmonary artery systolic pressure. The tricuspid regurgitant velocity is 2.99 m/s, and with an assumed right atrial pressure of 10 mmHg, the estimated right ventricular systolic pressure is 67.1 mmHg. Left Atrium: Left atrial size was mildly dilated. Right Atrium: Right atrial size was normal in size. Pericardium: Trivial pericardial effusion  is present. Mitral Valve: The mitral valve is normal in structure. No evidence of mitral valve regurgitation. No evidence of mitral valve stenosis. Tricuspid Valve: The tricuspid valve is normal in structure. Tricuspid valve regurgitation is mild . No evidence of tricuspid stenosis. Aortic Valve: The aortic valve is normal in structure. Aortic valve regurgitation is mild. No aortic stenosis is present. Aortic valve mean gradient measures 4.0 mmHg. Aortic valve peak gradient measures 5.7 mmHg. Aortic valve area, by VTI measures 1.83 cm. Pulmonic  Valve: The pulmonic valve was normal in structure. Pulmonic valve regurgitation is mild. No evidence of pulmonic stenosis. Aorta: The aortic root is normal in size and structure. Venous: The inferior vena cava is normal in size with greater than 50% respiratory variability, suggesting right atrial pressure of 3 mmHg. IAS/Shunts: No atrial level shunt detected by color flow Doppler.  LEFT VENTRICLE PLAX 2D LVIDd:         6.52 cm  Diastology LVIDs:         4.75 cm  LV e' medial:    3.00 cm/s LV PW:         1.12 cm  LV E/e' medial:  23.8 LV IVS:        1.35 cm  LV e' lateral:   5.59 cm/s LVOT diam:     2.30 cm  LV E/e' lateral: 12.8 LV SV:         42 LV SV Index:   21 LVOT Area:     4.15 cm  RIGHT VENTRICLE RV S prime:     7.89 cm/s TAPSE (M-mode): 1.3 cm LEFT ATRIUM              Index LA diam:        4.40 cm  2.20 cm/m LA Vol (A2C):   103.0 ml 51.56 ml/m LA Vol (A4C):   108.0 ml 54.06 ml/m LA Biplane Vol: 108.0 ml 54.06 ml/m  AORTIC VALVE                   PULMONIC VALVE AV Area (Vmax):    1.75 cm    PV Vmax:       0.99 m/s AV Area (Vmean):   1.60 cm    PV Peak grad:  3.9 mmHg AV Area (VTI):     1.83 cm AV Vmax:           119.00 cm/s AV Vmean:          94.200 cm/s AV VTI:            0.232 m AV Peak Grad:      5.7 mmHg AV Mean Grad:      4.0 mmHg LVOT Vmax:         50.20 cm/s LVOT Vmean:        36.200 cm/s LVOT VTI:          0.102 m LVOT/AV VTI ratio: 0.44  AORTA Ao  Root diam: 3.80 cm MV E velocity: 71.30 cm/s  TRICUSPID VALVE MV A velocity: 50.00 cm/s  TR Peak grad:   35.8 mmHg MV E/A ratio:  1.43        TR Vmax:        299.00 cm/s                             SHUNTS                            Systemic VTI:  0.10 m                            Systemic Diam: 2.30 cm Ida Rogue MD Electronically signed by Ida Rogue MD Signature Date/Time: 11/04/2020/3:02:24 PM    Final (Updated)     Management plans discussed with the patient, family and they are in agreement.  CODE STATUS:  Code Status Orders  (From admission, onward)         Start     Ordered   11/02/20 2146  Full code  Continuous        11/02/20 2147        Code Status History    This patient has a current code status but no historical code status.   Advance Care Planning Activity      TOTAL TIME TAKING CARE OF THIS PATIENT: 35 minutes.    Loletha Grayer M.D on 11/04/2020 at 4:50 PM  Between 7am to 6pm - Pager - [phone]  After 6pm go to [email or web address] - password [email or web address] [facility]  [facility]  CC: Primary care physician; [name], MD

## 2020-11-04 NOTE — Evaluation (Signed)
Occupational Therapy Evaluation Patient Details Name: Gabriel Martin MRN: [id number] DOB: 03/04/1941 Today's Date: 11/04/2020    History of Present Illness Gabriel Martin is a 79 y.o. male with medical history significant for hyperlipidemia, stage III chronic kidney disease, BPH, who is admitted to [facility] on 11/02/2020 with syncope after presenting from home to [facility] complaining of a single episode of loss of consciousness. Upon awakening, noted that he had difficulty with coordination.  Subsequent MRI brain without contrast demonstrated 2 left MCA strokes.   Clinical Impression   Gabriel Martin received sitting in bed, fully dressed, wife present in room. They live in a one-story home, with many family members nearby. 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 The pt and his wife report sharing cooking, cleaning, shopping duties. Upon evaluation today, Gabriel Martin reports no pain; is A&O x 4; IND in bed mobility, transfers, ambulation, grooming, dressing, w/o use of AD. Other than the incident 2 days ago, pt reports no falls in the previous 12 months. Therapist provided educ re: signs of stroke and importance of seeking medical assistance immediately if any of these symptoms appear. Both pt and wife verbalize understanding and agreement.  Pt denies needing assistance at present with any ADL or IADL task. Ongoing OT is not indicated at this time.     Follow Up Recommendations  No OT follow up    Equipment Recommendations  None recommended by OT    Recommendations for Other Services       Precautions / Restrictions Precautions Precautions: Fall Restrictions Weight Bearing Restrictions: No      Mobility Bed Mobility Overal bed mobility: Independent                  Transfers Overall transfer level: Independent                     Balance Overall balance assessment: Independent                                         ADL either performed or assessed with clinical judgement   ADL Overall ADL's : Independent                                             Vision Patient Visual Report: No change from baseline       Perception     Praxis      Pertinent Vitals/Pain Pain Assessment: No/denies pain     Hand Dominance     Extremity/Trunk Assessment Upper Extremity Assessment Upper Extremity Assessment: Overall WFL for tasks assessed   Lower Extremity Assessment Lower Extremity Assessment: Overall WFL for tasks assessed       Communication Communication Communication: No difficulties   Cognition Arousal/Alertness: Awake/alert Behavior During Therapy: WFL for tasks assessed/performed Overall Cognitive Status: Within Functional Limits for tasks assessed                                     General Comments  small cut on head and hand, 2/2 fall    Exercises  Other Exercises Other Exercises: Educ re: stroke awareness, falls prevention. Bed mobility, transfers, functional mobility, grooming.   Shoulder Instructions      Home Living Family/patient expects to be discharged to:: Private residence Living Arrangements: Spouse/significant other (children, grandchildren live nearby) Available Help at Discharge: Family Type of Home: House Home Access: Stairs to enter Technical brewer of Steps: 2 Entrance Stairs-Rails: Can reach both Home Layout: One level     Bathroom Shower/Tub: Occupational psychologist: Standard     Home Equipment: Grab bars - toilet;Grab bars - tub/shower          Prior Functioning/Environment Level of Independence: Independent                 OT Problem List: Impaired balance (sitting and/or standing);Decreased activity tolerance;Decreased coordination      OT  Treatment/Interventions:      OT Goals(Current goals can be found in the care plan section) Acute Rehab OT Goals Patient Stated Goal: to go home OT Goal Formulation: With patient/family Time For Goal Achievement: 11/18/20 Potential to Achieve Goals: Good  OT Frequency:     Barriers to D/C:            Co-evaluation              AM-PAC OT "6 Clicks" Daily Activity     Outcome Measure Help from another person eating meals?: None Help from another person taking care of personal grooming?: None Help from another person toileting, which includes using toliet, bedpan, or urinal?: None Help from another person bathing (including washing, rinsing, drying)?: None Help from another person to put on and taking off regular upper body clothing?: None Help from another person to put on and taking off regular lower body clothing?: None 6 Click Score: 24   End of Session    Activity Tolerance: Patient tolerated treatment well Patient left: in bed;with call bell/phone within reach;with family/visitor present  OT Visit Diagnosis: Unsteadiness on feet (R26.81);Muscle weakness (generalized) (M62.81)                Time: 1046-1100 OT Time Calculation (min): 14 min Charges:  OT General Charges $OT Visit: 1 Visit OT Evaluation $OT Eval Low Complexity: 1 Low OT Treatments $Self Care/Home Management : 8-22 mins  Josiah Lobo, PhD, MS, OTR/L ascom [phone] 11/04/20, 11:14 AM

## 2020-11-04 NOTE — Consult Note (Signed)
NEUROLOGY CONSULTATION NOTE   Date of service: November 04, 2020 Patient Name: Gabriel Martin MRN:  [id number] DOB:  Oct 09, 1941 Reason for consult: "strokes" _ _ _   _ __   _ __ _ _  __ __   _ __   __ _  History of Present Illness  Gabriel Martin is a 79 y.o. male with PMH significant for hyperlipidemia, CKD, BPH, reflux disease admitted with an episode of loss of consciousness.    Patient was at [place] and was leaving the restaurant and had an episode of loss of consciousness that was a couple seconds, collapsed and fell.  Upon awakening, noted that he had difficulty with coordination.  He reporst that a little bit before that, he spilled soda while trying to get it. He had an MRI brain without contrast which demonstrated 2 left MCA strokes.  He denies any previous hx of strokes, no TIA. No prior hx of seizures, no aura, no post ictal confusion. He was pretty much back to his baseline. Wife who was tehre did not witness any seizure activity.    ROS   Constitutional Denies weight loss, fever and chills.   HEENT Denies changes in vision and hearing.   Respiratory Denies SOB and cough.   CV Denies palpitations and CP   GI Denies abdominal pain, nausea, vomiting and diarrhea.   GU Denies dysuria and urinary frequency.   MSK Denies myalgia and joint pain.   Skin Denies rash and pruritus.   Neurological Denies headache and syncope.   Psychiatric Denies recent changes in mood. Denies anxiety and depression.    Past History   Past Medical History:  Diagnosis Date  . Acid reflux 05/12/2015  . Anemia   . Arthritis    finger thumb  . Benign enlargement of prostate   . Benign neoplasm of large bowel   . Benign prostatic hyperplasia with urinary obstruction 05/12/2015  . BPH (benign prostatic hyperplasia)   . BPH with obstruction/lower urinary tract symptoms 05/12/2015  . Change in blood platelet count 05/12/2015  . Chronic kidney disease 05/12/2015  . GERD (gastroesophageal  reflux disease)   . HLD (hyperlipidemia) 05/12/2015  . Hyperlipidemia   . Nocturia associated with benign prostatic hypertrophy   . Penile ulcer 05/12/2015  . Skin cancer   . Thrombocytopenia ([place])    Past Surgical History:  Procedure Laterality Date  . CATARACT EXTRACTION W/PHACO Left 02/29/2016   Procedure: CATARACT EXTRACTION PHACO AND INTRAOCULAR LENS PLACEMENT ([place]) left;  Surgeon: Leandrew Koyanagi, MD;  Location: [place];  Service: Ophthalmology;  Laterality: Left;  . CATARACT EXTRACTION W/PHACO Right 04/25/2016   Procedure: CATARACT EXTRACTION PHACO AND INTRAOCULAR LENS PLACEMENT (IOC);  Surgeon: Leandrew Koyanagi, MD;  Location: [place];  Service: Ophthalmology;  Laterality: Right;  . COLONOSCOPY WITH PROPOFOL N/A 07/01/2017   Procedure: COLONOSCOPY WITH PROPOFOL;  Surgeon: Manya Silvas, MD;  [facility] END[facility]COPY;  Service: Endoscopy;  Laterality: N/A;  . HEMORRHOID SURGERY     Family History  Problem Relation Age of Onset  . Heart disease Brother    Social History   Socioeconomic History  . Marital status: Married    Spouse name: Not on file  . Number of children: Not on file  . Years of education: Not on file  . Highest education level: Not on file  Occupational History  . Not on file  Tobacco Use  . Smoking status: Former Smoker    Types: Cigarettes  Quit date: 11/26/1978    Years since quitting: 41.9  . Smokeless tobacco: Never Used  . Tobacco comment: quit 40 years ago  Vaping Use  . Vaping Use: Never used  Substance and Sexual Activity  . Alcohol use: Yes    Alcohol/week: 6.0 standard drinks    Types: 6 Cans of beer per week    Comment: occasional  . Drug use: No  . Sexual activity: Never  Other Topics Concern  . Not on file  Social History Narrative  . Not on file   Social Determinants of Health   Financial Resource Strain: Not on file  Food Insecurity: Not on file  Transportation Needs: Not on file  Physical  Activity: Not on file  Stress: Not on file  Social Connections: Not on file   No Known Allergies  Medications   Medications Prior to Admission  Medication Sig Dispense Refill Last Dose  . acetaminophen (TYLENOL) 500 MG tablet Take 500 mg by mouth every 6 (six) hours as needed.   PRN at PRN  . famotidine (PEPCID) 20 MG tablet Take 20 mg by mouth daily.   11/02/2020 at 0500  . gabapentin (NEURONTIN) 100 MG capsule Take 1 capsule by mouth in the morning and at bedtime.   11/02/2020 at 0500  . lovastatin (MEVACOR) 20 MG tablet Take 20 mg by mouth daily.    11/02/2020 at 0500  . ranitidine (ZANTAC) 75 MG tablet Take 150 mg by mouth 2 (two) times daily.  (Patient not taking: Reported on 11/02/2020)   Not Taking at Unknown time     Vitals   Vitals:   11/04/20 0500 11/04/20 0612 11/04/20 0614 11/04/20 0724  BP:  (!) 133/95 (!) 146/95 (!) 134/95  Pulse:  78 85 74  Resp:  19  16  Temp:  (!) 97.4 F (36.3 C)  97.7 F (36.5 C)  TempSrc:  Oral  Oral  SpO2:  97% 95% 96%  Weight: 90.5 kg     Height:         Body mass index is 32.2 kg/m.  Physical Exam   General: Laying comfortably in bed; in no acute distress.  HENT: Normal oropharynx and mucosa. Normal external appearance of ears and nose. Neck: Supple, no pain or tenderness  CV: No JVD. No peripheral edema.  Pulmonary: Symmetric Chest rise. Normal respiratory effort.  Abdomen: Soft to touch, non-tender.  Ext: No cyanosis, edema, or deformity  Skin: No rash. Normal palpation of skin.   Musculoskeletal: Normal digits and nails by inspection. No clubbing.   Neurologic Examination  Mental status/Cognition: Alert, oriented to self, place, month and year, good attention.  Speech/language: Fluent, comprehension intact, object naming intact, repetition intact.  Cranial nerves:   CN II Pupils equal and reactive to light, no VF deficits    CN III,IV,VI EOM intact, no gaze preference or deviation, no nystagmus    CN V normal sensation in  V1, V2, and V3 segments bilaterally    CN VII no asymmetry, no nasolabial fold flattening    CN VIII normal hearing to speech    CN IX & X normal palatal elevation, no uvular deviation    CN XI 5/5 head turn and 5/5 shoulder shrug bilaterally    CN XII midline tongue protrusion    Motor:  Muscle bulk: normal, tone normal, pronator drift none tremor none Mvmt Root Nerve  Muscle Right Left Comments  SA C5/6 Ax Deltoid 5 5   EF C5/6 Mc Biceps 5  5   EE C6/7/8 Rad Triceps 5 5   WF C6/7 Med FCR 5 5   WE C7/8 PIN ECU 5 5   F Ab C8/T1 U ADM/FDI 5 5   HF L1/2/3 Fem Illopsoas 5 4+   KE L2/3/4 Fem Quad 5 5   DF L4/5 D Peron Tib Ant 5 5   PF S1/2 Tibial Grc/Sol 5 5    Reflexes:  Right Left Comments  Pectoralis      Biceps (C5/6) 1 1   Brachioradialis (C5/6) 1 1    Triceps (C6/7) 1 1    Patellar (L3/4) 0 0    Achilles (S1) 1 1    Hoffman      Plantar     Jaw jerk    Sensation:  Light touch Intact throughout   Pin prick    Temperature    Vibration   Proprioception    Coordination/Complex Motor:  - Finger to Nose intact BL - Heel to shin intact BL - Rapid alternating movement mild dysdiadokokinesia in RUE - Gait: Stride length short. Arm swing normal. Base width normal  Labs   CBC:  Recent Labs  Lab 11/02/20 1908 11/03/20 0631  WBC 6.8 5.6  HGB 14.3 13.8  HCT 43.8 41.6  MCV 98.9 97.7  PLT 99* 101*    Basic Metabolic Panel:  Lab Results  Component Value Date   NA 139 11/04/2020   K 4.1 11/04/2020   CO2 25 11/04/2020   GLUCOSE 112 (H) 11/04/2020   BUN 20 11/04/2020   CREATININE 1.19 11/04/2020   CALCIUM 9.0 11/04/2020   GFRNONAA >60 11/04/2020   Lipid Panel:  Lab Results  Component Value Date   LDLCALC 75 11/04/2020   HgbA1c: No results found for: HGBA1C Urine Drug Screen: No results found for: LABOPIA, COCAINSCRNUR, LABBENZ, AMPHETMU, THCU, LABBARB  Alcohol Level No results found for: [place]  CT Head without contrast: CTH was negative for a large  hypodensity concerning for a large territory infarct or hyperdensity concerning for an ICH  MR angio Head and Carotid duplex: No LVO, no significant stenosis.  MRI Brain with a small cortical left parietal lobe infarct and a small subcortical L frontal infarct   Impression   Gabriel Martin is a 79 y.o. male with PMH significant for hyperlipidemia, CKD, BPH, reflux disease admitted with 2 small L MCA infarcts. Would favor embolic etiology given the cortical location of the parietal lobe infarct rather than small vessel. Vessel imaging with no LVO, no significant stenosis, LDL of 75. HbA1c is pending. TTE with EF of 30-35%, no  Atrial level shunt on color doppler.  Recommendations  Plan: - Frequent Neuro checks per stroke unit protocol - Recommend obtaining TTE - LDL of 75 but had calf pain with increasing lovastatin dose in the past. - HbA1c is pending. - Antithrombotic - Aspirin 81mg  daily and plavix 75mg  daily x 3 weeks only, followed by Aspirin 81mg  daily alone - SBP goal - permissive hypertension first 24 h < 220/110. Held home meds.  - Recommend Telemetry monitoring for arrythmia - Stroke education booklet - Recommend PT/OT/SLP consult - I ordered 4 weeks cardiac event monitor for outpatient. - Recommend outpatient TEE given concern for embolic etiology and low EF. - Follow up with Dr. Leonie Man in stroke clinic with [facility]. ______________________________________________________________________   Thank you for the opportunity to take part in the care of this patient. If you have any further questions, please contact the neurology consultation attending.  Signed,  [place] Pager Number [phone] _ _ _   _ __   _ __ _ _  __ __   _ __   __ _

## 2020-11-07 ENCOUNTER — Telehealth: Payer: Self-pay | Admitting: Family

## 2020-11-07 NOTE — Telephone Encounter (Signed)
LVM with patient in attempt to schedule a new patient CHF Clinic appointment after receiving a referral from the hospitalist.    Luetta Nutting, NT

## 2020-11-08 DIAGNOSIS — Z09 Encounter for follow-up examination after completed treatment for conditions other than malignant neoplasm: Secondary | ICD-10-CM | POA: Diagnosis not present

## 2020-11-08 DIAGNOSIS — N401 Enlarged prostate with lower urinary tract symptoms: Secondary | ICD-10-CM | POA: Diagnosis not present

## 2020-11-08 DIAGNOSIS — Z6834 Body mass index (BMI) 34.0-34.9, adult: Secondary | ICD-10-CM | POA: Diagnosis not present

## 2020-11-08 DIAGNOSIS — G459 Transient cerebral ischemic attack, unspecified: Secondary | ICD-10-CM | POA: Diagnosis not present

## 2020-11-08 DIAGNOSIS — I519 Heart disease, unspecified: Secondary | ICD-10-CM | POA: Diagnosis not present

## 2020-11-16 NOTE — Progress Notes (Signed)
Patient ID: Gabriel Martin, male    DOB: 03-02-1941, 79 y.o.   MRN: [id number]  HPI  Mr Bense is a 79 y/o male with a history of hyperlipidemia, CKD, stroke, anemia, GERD, BPH, previous tobacco use and chronic heart failure.   Echo report from 11/04/20 reviewed and showed an EF of 30-35% along with moderate elevated PA pressure of 45.8 mmHg.   Admitted 11/02/20 due to acute embolic CVA and cardiomegaly. Presented after syncopal event. Neurology consult obtained. MRI of the brain showed 2 acute left MCA territory cortical infarcts with 1 measuring 15 mm within the left parietal lobe and the other within the left frontal lobe motor strip. Carotid ultrasound showed minimal plaque. PT/OT consults obtained. IV lasix given with transition to oral diuretics. Discharged after 2 days.   He presents today for his initial visit with a chief complaint of minimal fatigue upon moderate exertion. He describes this as feeling more like his legs are tired and has been present for several weeks. He has associated intermittent dizziness and easy bruising along with this. He denies any difficulty sleeping, abdominal distention, palpitations, pedal edema, chest pain, shortness of breath, cough or weight gain.   Overall, he says that he feels quite well today.   Past Medical History:  Diagnosis Date  . Acid reflux 05/12/2015  . Anemia   . Arthritis    finger thumb  . Benign enlargement of prostate   . Benign neoplasm of large bowel   . Benign prostatic hyperplasia with urinary obstruction 05/12/2015  . BPH (benign prostatic hyperplasia)   . BPH with obstruction/lower urinary tract symptoms 05/12/2015  . Change in blood platelet count 05/12/2015  . CHF (congestive heart failure) ([place])   . Chronic kidney disease 05/12/2015  . GERD (gastroesophageal reflux disease)   . HLD (hyperlipidemia) 05/12/2015  . Hyperlipidemia   . Nocturia associated with benign prostatic hypertrophy   . Penile ulcer 05/12/2015  .  Skin cancer   . Stroke ([place])   . Thrombocytopenia ([place])    Past Surgical History:  Procedure Laterality Date  . CATARACT EXTRACTION W/PHACO Left 02/29/2016   Procedure: CATARACT EXTRACTION PHACO AND INTRAOCULAR LENS PLACEMENT ([place]) left;  Surgeon: Leandrew Koyanagi, MD;  Location: [place];  Service: Ophthalmology;  Laterality: Left;  . CATARACT EXTRACTION W/PHACO Right 04/25/2016   Procedure: CATARACT EXTRACTION PHACO AND INTRAOCULAR LENS PLACEMENT (IOC);  Surgeon: Leandrew Koyanagi, MD;  Location: [place];  Service: Ophthalmology;  Laterality: Right;  . COLONOSCOPY WITH PROPOFOL N/A 07/01/2017   Procedure: COLONOSCOPY WITH PROPOFOL;  Surgeon: Manya Silvas, MD;  Location: [facility] ENDOSCOPY;  Service: Endoscopy;  Laterality: N/A;  . HEMORRHOID SURGERY     Family History  Problem Relation Age of Onset  . Heart disease Brother    Social History   Tobacco Use  . Smoking status: Former Smoker    Types: Cigarettes    Quit date: 11/26/1978    Years since quitting: 42.0  . Smokeless tobacco: Never Used  . Tobacco comment: quit 40 years ago  Substance Use Topics  . Alcohol use: Yes    Alcohol/week: 6.0 standard drinks    Types: 6 Cans of beer per week    Comment: occasional   No Known Allergies Prior to Admission medications   Medication Sig Start Date End Date Taking? Authorizing Provider  acetaminophen (TYLENOL) 500 MG tablet Take 500 mg by mouth every 6 (six) hours as needed.   Yes [provider]  aspirin EC  81 MG EC tablet Take 1 tablet (81 mg total) by mouth daily. Swallow whole. 11/05/20  Yes Loletha Grayer, MD  atorvastatin (LIPITOR) 40 MG tablet Take 1 tablet (40 mg total) by mouth daily at 6 PM. 11/04/20  Yes Wieting, Richard, MD  clopidogrel (PLAVIX) 75 MG tablet Take 1 tablet (75 mg total) by mouth daily for 21 doses. 11/05/20 11/26/20 Yes Wieting, Richard, MD  famotidine (PEPCID) 20 MG tablet Take 20 mg by mouth daily.   Yes [provider]  furosemide (LASIX) 20 MG tablet Take 1 tablet (20 mg total) by mouth daily. 11/05/20  Yes Wieting, Richard, MD  gabapentin (NEURONTIN) 100 MG capsule Take 1 capsule by mouth in the morning and at bedtime. 09/08/20  Yes [provider]  losartan (COZAAR) 25 MG tablet Take 0.5 tablets (12.5 mg total) by mouth at bedtime. 11/04/20 12/04/20 Yes Wieting, Richard, MD  metoprolol succinate (TOPROL-XL) 25 MG 24 hr tablet Take 0.5 tablets (12.5 mg total) by mouth daily. 11/05/20  Yes Wieting, Richard, MD    Review of Systems  Constitutional: Positive for fatigue ("legs feel tired at times"). Negative for appetite change.  HENT: Negative for congestion, postnasal drip and sore throat.   Eyes: Negative.   Respiratory: Negative for cough and shortness of breath.   Cardiovascular: Negative for chest pain, palpitations and leg swelling.  Gastrointestinal: Negative for abdominal distention and abdominal pain.  Endocrine: Negative.   Genitourinary: Negative.   Musculoskeletal: Negative for back pain and neck pain.  Skin: Negative.   Allergic/Immunologic: Negative.   Neurological: Positive for dizziness. Negative for light-headedness.  Hematological: Negative for adenopathy. Bruises/bleeds easily.  Psychiatric/Behavioral: Negative for dysphoric mood and sleep disturbance (sleeping on 1 pillow). The patient is not nervous/anxious.    Vitals:   11/21/20 0818 11/21/20 0836  BP: (!) 141/104 126/72  Pulse: (!) 43   Resp: 16   SpO2: 98%   Weight: 205 lb 6 oz (93.2 kg)   Height: 5\' 6"  (1.676 m)    Wt Readings from Last 3 Encounters:  11/21/20 205 lb 6 oz (93.2 kg)  11/04/20 199 lb 8.3 oz (90.5 kg)  05/23/20 209 lb (94.8 kg)   Lab Results  Component Value Date   CREATININE 1.19 11/04/2020   CREATININE 1.24 11/03/2020   CREATININE 1.38 (H) 11/02/2020    Physical Exam Vitals and nursing note reviewed. Exam conducted with a chaperone present (wife present in the room).   Constitutional:      Appearance: He is well-developed.  HENT:     Head: Normocephalic and atraumatic.  Neck:     Vascular: No JVD.  Cardiovascular:     Rate and Rhythm: Regular rhythm. Bradycardia present.  Pulmonary:     Effort: Pulmonary effort is normal. No respiratory distress.     Breath sounds: No wheezing or rales.  Abdominal:     Palpations: Abdomen is soft.     Tenderness: There is no abdominal tenderness.  Musculoskeletal:     Cervical back: Neck supple.     Right lower leg: No tenderness. No edema.     Left lower leg: No tenderness. No edema.  Skin:    General: Skin is warm and dry.  Neurological:     General: No focal deficit present.     Mental Status: He is alert and oriented to person, place, and time.  Psychiatric:        Mood and Affect: Mood normal.        Behavior: Behavior  normal.     Assessment & Plan:  1: Chronic heart failure with reduced ejection fraction- - NYHA class II - euvolemic today - weighing daily; instructed to call for an overnight weight gain of > 2 pounds or a weekly weight gain of > 5 pounds - not adding salt and has been recently reading food labels for sodium content; reviewed the importance of closely following a 2000mg  sodium diet and written dietary information was provided - was able to walk to the office from the [place] entrance today - sees cardiology (Agbor-Etang) 11/24/20 - bradycardic today so unable to titrate metoprolol succinate - consider changing his losartan to entresto if BP allows; could also consider adding farxiga and/or spironolactone - BNP  11/04/20 was 710.8 - has received his flu vaccine for this season - has received 2 covid vaccines; not the booster yet  2: CKD- - saw PCP (Hande) 11/08/20 - BP initially elevated after the walk to the office (141/104) but much improved upon recheck with manual cuff (126/72) - BMP 11/04/20 reviewed and showed sodium 139, potassium 4.1, creatinine 1.19 and GFR  >66  3: Embolic stroke- - started on plavix and will finish this after 21 days - would not want BP to be too low due to recent stroke   Medication bottles reviewed. Went over what each medication is used for as he said that most of the medications were new to him.   Return in 6 weeks or sooner for any questions/problems before then.

## 2020-11-21 ENCOUNTER — Other Ambulatory Visit: Payer: Self-pay

## 2020-11-21 ENCOUNTER — Ambulatory Visit: Payer: PPO | Attending: Family | Admitting: Family

## 2020-11-21 ENCOUNTER — Encounter: Payer: Self-pay | Admitting: Family

## 2020-11-21 VITALS — BP 126/72 | HR 43 | Resp 16 | Ht 66.0 in | Wt 205.4 lb

## 2020-11-21 DIAGNOSIS — Z8673 Personal history of transient ischemic attack (TIA), and cerebral infarction without residual deficits: Secondary | ICD-10-CM | POA: Insufficient documentation

## 2020-11-21 DIAGNOSIS — Z79899 Other long term (current) drug therapy: Secondary | ICD-10-CM | POA: Insufficient documentation

## 2020-11-21 DIAGNOSIS — I5022 Chronic systolic (congestive) heart failure: Secondary | ICD-10-CM | POA: Diagnosis not present

## 2020-11-21 DIAGNOSIS — N189 Chronic kidney disease, unspecified: Secondary | ICD-10-CM | POA: Insufficient documentation

## 2020-11-21 DIAGNOSIS — Z7902 Long term (current) use of antithrombotics/antiplatelets: Secondary | ICD-10-CM | POA: Insufficient documentation

## 2020-11-21 DIAGNOSIS — Z8249 Family history of ischemic heart disease and other diseases of the circulatory system: Secondary | ICD-10-CM | POA: Insufficient documentation

## 2020-11-21 DIAGNOSIS — Z87891 Personal history of nicotine dependence: Secondary | ICD-10-CM | POA: Diagnosis not present

## 2020-11-21 DIAGNOSIS — Z7982 Long term (current) use of aspirin: Secondary | ICD-10-CM | POA: Insufficient documentation

## 2020-11-21 DIAGNOSIS — R001 Bradycardia, unspecified: Secondary | ICD-10-CM | POA: Insufficient documentation

## 2020-11-21 DIAGNOSIS — N400 Enlarged prostate without lower urinary tract symptoms: Secondary | ICD-10-CM | POA: Insufficient documentation

## 2020-11-21 DIAGNOSIS — N182 Chronic kidney disease, stage 2 (mild): Secondary | ICD-10-CM

## 2020-11-21 NOTE — Patient Instructions (Signed)
Continue weighing daily and call for an overnight weight gain of > 2 pounds or a weekly weight gain of >5 pounds. 

## 2020-11-24 ENCOUNTER — Ambulatory Visit (INDEPENDENT_AMBULATORY_CARE_PROVIDER_SITE_OTHER): Payer: PPO | Admitting: Cardiology

## 2020-11-24 ENCOUNTER — Encounter: Payer: Self-pay | Admitting: Cardiology

## 2020-11-24 ENCOUNTER — Ambulatory Visit (INDEPENDENT_AMBULATORY_CARE_PROVIDER_SITE_OTHER): Payer: PPO

## 2020-11-24 ENCOUNTER — Other Ambulatory Visit: Payer: Self-pay

## 2020-11-24 VITALS — BP 108/62 | HR 70 | Ht 66.0 in | Wt 207.4 lb

## 2020-11-24 DIAGNOSIS — I5020 Unspecified systolic (congestive) heart failure: Secondary | ICD-10-CM

## 2020-11-24 DIAGNOSIS — I639 Cerebral infarction, unspecified: Secondary | ICD-10-CM

## 2020-11-24 NOTE — Progress Notes (Signed)
Cardiology Office Note:    Date:  11/24/2020   ID:  Gabriel Martin, DOB 1941/09/27, MRN [id number]  PCP:  [name], MD  [place] Cardiologist:  No primary care provider on file.  [facility] Electrophysiologist:  None   Referring MD: [name], MD   Chief Complaint  Patient presents with  . New Patient (Initial Visit)    Ref by Dr. Leslye Peer for syncope & CHF. Patient c/o dizziness and shortness of breath.    Gabriel Martin is a 79 y.o. male who is being seen today for the evaluation of syncope at the request of [name], MD.   History of Present Illness:    Gabriel Martin is a 79 y.o. male with a hx of hypertension, CVA 10/2019 who presents due to syncope.  Patient presented to the ED on 12/8 due to confusion, lightheadedness and loss consciousness, falling to the ground and hitting head.  In the ED, work-up with an MRI showed 2 acute left MCA territory cortical infarcts.  He was started on aspirin, Plavix, Lipitor.  Echocardiogram showed moderate to severely reduced ejection fraction, EF 30 to 35%, global hypokinesis.  Medical records reports state cardiac monitor was ordered by neurology.  Outpatient TEE was recommended to evaluate possible cardioembolic phenomenon.  Neurology recommended aspirin and Plavix x3 weeks followed by aspirin daily only.  Patient denies having any cardiac monitor placed.  He endorses shortness of breath prior to his admission to the ED, his breathing has improved since discharge.  Is able to walk a good distance before getting out of breath.  Denies edema.  Orthopnea is improved since starting Lasix.  He has a family history of MI in the brother age 87.  Past Medical History:  Diagnosis Date  . Acid reflux 05/12/2015  . Anemia   . Arthritis    finger thumb  . Benign enlargement of prostate   . Benign neoplasm of large bowel   . Benign prostatic hyperplasia with urinary obstruction 05/12/2015  . BPH (benign  prostatic hyperplasia)   . BPH with obstruction/lower urinary tract symptoms 05/12/2015  . Change in blood platelet count 05/12/2015  . CHF (congestive heart failure) ([place])   . Chronic kidney disease 05/12/2015  . GERD (gastroesophageal reflux disease)   . HLD (hyperlipidemia) 05/12/2015  . Hyperlipidemia   . Nocturia associated with benign prostatic hypertrophy   . Penile ulcer 05/12/2015  . Skin cancer   . Stroke ([place])   . Thrombocytopenia ([place])     Past Surgical History:  Procedure Laterality Date  . CATARACT EXTRACTION W/PHACO Left 02/29/2016   Procedure: CATARACT EXTRACTION PHACO AND INTRAOCULAR LENS PLACEMENT ([place]) left;  Surgeon: Leandrew Koyanagi, MD;  Location: [place];  Service: Ophthalmology;  Laterality: Left;  . CATARACT EXTRACTION W/PHACO Right 04/25/2016   Procedure: CATARACT EXTRACTION PHACO AND INTRAOCULAR LENS PLACEMENT ([facility]);  Surgeon: Leandrew Koyanagi, MD;  Location: [place];  Service: Ophthalmology;  Laterality: Right;  . COLONOSCOPY WITH PROPOFOL N/A 07/01/2017   Procedure: COLONOSCOPY WITH PROPOFOL;  Surgeon: Manya Silvas, MD;  Location: [facility] ENDOSCOPY;  Service: Endoscopy;  Laterality: N/A;  . HEMORRHOID SURGERY      Current Medications: Current Meds  Medication Sig  . acetaminophen (TYLENOL) 500 MG tablet Take 500 mg by mouth every 6 (six) hours as needed.  Marland Kitchen aspirin EC 81 MG EC tablet Take 1 tablet (81 mg total) by mouth daily. Swallow whole.  Marland Kitchen atorvastatin (LIPITOR) 40 MG tablet Take 1 tablet (  40 mg total) by mouth daily at 6 PM.  . clopidogrel (PLAVIX) 75 MG tablet Take 1 tablet (75 mg total) by mouth daily for 21 doses.  . famotidine (PEPCID) 20 MG tablet Take 20 mg by mouth daily.  . furosemide (LASIX) 20 MG tablet Take 1 tablet (20 mg total) by mouth daily.  Marland Kitchen gabapentin (NEURONTIN) 100 MG capsule Take 100 mg by mouth daily.  Marland Kitchen losartan (COZAAR) 25 MG tablet Take 0.5 tablets (12.5 mg total) by mouth at bedtime.  . metoprolol  succinate (TOPROL-XL) 25 MG 24 hr tablet Take 0.5 tablets (12.5 mg total) by mouth daily.     Allergies:   Patient has no known allergies.   Social History   Socioeconomic History  . Marital status: Married    Spouse name: Not on file  . Number of children: Not on file  . Years of education: Not on file  . Highest education level: Not on file  Occupational History  . Not on file  Tobacco Use  . Smoking status: Former Smoker    Types: Cigarettes    Quit date: 11/26/1978    Years since quitting: 42.0  . Smokeless tobacco: Never Used  . Tobacco comment: quit 40 years ago  Vaping Use  . Vaping Use: Never used  Substance and Sexual Activity  . Alcohol use: Yes    Alcohol/week: 6.0 standard drinks    Types: 6 Cans of beer per week    Comment: occasional  . Drug use: No  . Sexual activity: Never  Other Topics Concern  . Not on file  Social History Narrative  . Not on file   Social Determinants of Health   Financial Resource Strain: Not on file  Food Insecurity: Not on file  Transportation Needs: Not on file  Physical Activity: Not on file  Stress: Not on file  Social Connections: Not on file     Family History: The patient's family history includes Heart attack in his mother; Heart attack (age of onset: 69) in his nephew; Heart attack (age of onset: 87) in his brother; Heart disease in his brother.  ROS:   Please see the history of present illness.     All other systems reviewed and are negative.  EKGs/Labs/Other Studies Reviewed:    The following studies were reviewed today:   EKG:  EKG is  ordered today.  The ekg ordered today demonstrates sinus rhythm, PVCs.  Recent Labs: 11/03/2020: ALT 24; Hemoglobin 13.8; Magnesium 2.2; Platelets 101 11/04/2020: B Natriuretic Peptide 710.8; BUN 20; Creatinine, Ser 1.19; Potassium 4.1; Sodium 139  Recent Lipid Panel    Component Value Date/Time   CHOL 137 11/04/2020 0526   TRIG 68 11/04/2020 0526   HDL 48 11/04/2020 0526    CHOLHDL 2.9 11/04/2020 0526   VLDL 14 11/04/2020 0526   LDLCALC 75 11/04/2020 0526     Risk Assessment/Calculations:      Physical Exam:    VS:  BP 108/62 (BP Location: Right Arm, Patient Position: Sitting, Cuff Size: Normal)   Pulse 70   Ht 5\' 6"  (1.676 m)   Wt 207 lb 6 oz (94.1 kg)   SpO2 98%   BMI 33.47 kg/m     Wt Readings from Last 3 Encounters:  11/24/20 207 lb 6 oz (94.1 kg)  11/21/20 205 lb 6 oz (93.2 kg)  11/04/20 199 lb 8.3 oz (90.5 kg)     GEN:  Well nourished, well developed in no acute distress HEENT: Normal NECK:  No JVD; No carotid bruits LYMPHATICS: No lymphadenopathy CARDIAC: RRR, no murmurs, rubs, gallops RESPIRATORY:  Clear to auscultation without rales, wheezing or rhonchi  ABDOMEN: Soft, non-tender, non-distended MUSCULOSKELETAL:  No edema; No deformity  SKIN: Warm and dry NEUROLOGIC:  Alert and oriented x 3 PSYCHIATRIC:  Normal affect   ASSESSMENT:    1. Cerebrovascular accident (CVA), unspecified mechanism (HCC)   2. HFrEF (heart failure with reduced ejection fraction) (HCC)    PLAN:    In order of problems listed above:  1. Recent stroke, 2 left MCA cortical territory.  Cardiac monitor previously ordered by neurology.  Continue aspirin Plavix, Lipitor as per neurology recs.  Moderate to severely reduced EF, no thrombus.  We will plan for TEE to evaluate thrombus.  Placed 2-week cardiac monitor to evaluate for arrhythmia such as A. fib or flutter.  Orthostatic vitals do not show orthostasis. 2. Recent echo with EF 30 to 35%.  Plan to obtain right and left heart cath at a future date after TEE has been performed.  Continue Toprol-XL, losartan for now.  Continue Lasix 20 mg daily.  Blood pressures low normal, preventing up titration of CHF meds.  Plan for referral to advanced heart failure clinic in the future after right and left heart cath.  Follow-up in 6 weeks.  Total encounter time more than 80 minutes  Greater than 50% was spent in  counseling and coordination of care with the patient    Shared Decision Making/Informed Consent The risks [esophageal damage, perforation (1:10,000 risk), bleeding, pharyngeal hematoma as well as other potential complications associated with conscious sedation including aspiration, arrhythmia, respiratory failure and death], benefits (treatment guidance and diagnostic support) and alternatives of a transesophageal echocardiogram were discussed in detail with Mr. Ekstein and he is willing to proceed.       Medication Adjustments/Labs and Tests Ordered: Current medicines are reviewed at length with the patient today.  Concerns regarding medicines are outlined above.  Orders Placed This Encounter  Procedures  . CBC  . Basic metabolic panel  . LONG TERM MONITOR (3-14 DAYS)  . EKG 12-Lead   No orders of the defined types were placed in this encounter.   Patient Instructions  Medication Instructions:  Your physician recommends that you continue on your current medications as directed. Please refer to the Current Medication list given to you today. *If you need a refill on your cardiac medications before your next appointment, please call your pharmacy*   Lab Work: BMP, CBC to be drawn today.  If you have labs (blood work) drawn today and your tests are completely normal, you will receive your results only by: Marland Kitchen MyChart Message (if you have MyChart) OR . A paper copy in the mail If you have any lab test that is abnormal or we need to change your treatment, we will call you to review the results.   Testing/Procedures:  1.  You are scheduled for a TEE on ________________ with Dr.__Agbor-Etang_________ [facility] at _________ a.m. on the day of your procedure.  DIET INSTRUCTIONS:   Nothing to eat or drink after midnight except your medications with a sip of water.        1) Medications:  YOU MAY TAKE ALL of your remaining medications with a  small amount of water.  2) Must have a responsible person to drive you home.  3) Bring a current list of your medications and current insurance cards.    If you  have any questions after you get home, please call the office at [phone]    2.  Your physician has recommended that you wear a Zio monitor. This monitor is a medical device that records the heart's electrical activity. Doctors most often use these monitors to diagnose arrhythmias. Arrhythmias are problems with the speed or rhythm of the heartbeat. The monitor is a small device applied to your chest. You can wear one while you do your normal daily activities. While wearing this monitor if you have any symptoms to push the button and record what you felt. Once you have worn this monitor for the period of time provider prescribed (Usually 14 days), you will return the monitor device in the postage paid box. Once it is returned they will download the data collected and provide [place] with a report which the provider will then review and we will call you with those results. Important tips:  1. Avoid showering during the first 24 hours of wearing the monitor. 2. Avoid excessive sweating to help maximize wear time. 3. Do not submerge the device, no hot tubs, and no swimming pools. 4. Keep any lotions or oils away from the patch. 5. After 24 hours you may shower with the patch on. Take brief showers with your back facing the shower head.  6. Do not remove patch once it has been placed because that will interrupt data and decrease adhesive wear time. 7. Push the button when you have any symptoms and write down what you were feeling. 8. Once you have completed wearing your monitor, remove and place into box which has postage paid and place in your outgoing mailbox.  9. If for some reason you have misplaced your box then call our office and we can provide another box and/or mail it off for you.         Follow-Up: At [facility], you and  your health needs are our priority.  As part of our continuing mission to provide you with exceptional heart care, we have created designated Provider Care Teams.  These Care Teams include your primary Cardiologist (physician) and Advanced Practice Providers (APPs -  Physician Assistants and Nurse Practitioners) who all work together to provide you with the care you need, when you need it.  We recommend signing up for the patient portal called "MyChart".  Sign up information is provided on this After Visit Summary.  MyChart is used to connect with patients for Virtual Visits (Telemedicine).  Patients are able to view lab/test results, encounter notes, upcoming appointments, etc.  Non-urgent messages can be sent to your provider as well.   To learn more about what you can do with MyChart, go to [email or web address].    Your next appointment:   6 week(s)  The format for your next appointment:   In Person  Provider:   Kate Sable, MD   Other Instructions      Signed, Kate Sable, MD  11/24/2020 1:09 PM    [place]

## 2020-11-24 NOTE — H&P (View-Only) (Signed)
Cardiology Office Note:    Date:  11/24/2020   ID:  [name], DOB 1941/09/01, MRN [id number]  PCP:  [name], MD  [place] Cardiologist:  No primary care provider on file.  [facility] Electrophysiologist:  None   Referring MD: [name], MD   Chief Complaint  Patient presents with  . New Patient (Initial Visit)    Ref by Dr. Leslye Peer for syncope & CHF. Patient c/o dizziness and shortness of breath.    Kerrick Avilla Glasco is a 79 y.o. male who is being seen today for the evaluation of syncope at the request of [name], MD.   History of Present Illness:    FISCHER BOASE is a 79 y.o. male with a hx of hypertension, CVA 10/2019 who presents due to syncope.  Patient presented to the ED on 12/8 due to confusion, lightheadedness and loss consciousness, falling to the ground and hitting head.  In the ED, work-up with an MRI showed 2 acute left MCA territory cortical infarcts.  He was started on aspirin, Plavix, Lipitor.  Echocardiogram showed moderate to severely reduced ejection fraction, EF 30 to 35%, global hypokinesis.  Medical records reports state cardiac monitor was ordered by neurology.  Outpatient TEE was recommended to evaluate possible cardioembolic phenomenon.  Neurology recommended aspirin and Plavix x3 weeks followed by aspirin daily only.  Patient denies having any cardiac monitor placed.  He endorses shortness of breath prior to his admission to the ED, his breathing has improved since discharge.  Is able to walk a good distance before getting out of breath.  Denies edema.  Orthopnea is improved since starting Lasix.  He has a family history of MI in the brother age 13.  Past Medical History:  Diagnosis Date  . Acid reflux 05/12/2015  . Anemia   . Arthritis    finger thumb  . Benign enlargement of prostate   . Benign neoplasm of large bowel   . Benign prostatic hyperplasia with urinary obstruction 05/12/2015  . BPH (benign  prostatic hyperplasia)   . BPH with obstruction/lower urinary tract symptoms 05/12/2015  . Change in blood platelet count 05/12/2015  . CHF (congestive heart failure) ([place])   . Chronic kidney disease 05/12/2015  . GERD (gastroesophageal reflux disease)   . HLD (hyperlipidemia) 05/12/2015  . Hyperlipidemia   . Nocturia associated with benign prostatic hypertrophy   . Penile ulcer 05/12/2015  . Skin cancer   . Stroke ([place])   . Thrombocytopenia ([place])     Past Surgical History:  Procedure Laterality Date  . CATARACT EXTRACTION W/PHACO Left 02/29/2016   Procedure: CATARACT EXTRACTION PHACO AND INTRAOCULAR LENS PLACEMENT ([place]) left;  Surgeon: Leandrew Koyanagi, MD;  Location: [facility];  Service: Ophthalmology;  Laterality: Left;  . CATARACT EXTRACTION W/PHACO Right 04/25/2016   Procedure: CATARACT EXTRACTION PHACO AND INTRAOCULAR LENS PLACEMENT ([facility]);  Surgeon: Leandrew Koyanagi, MD;  Location: [facility];  Service: Ophthalmology;  Laterality: Right;  . COLONOSCOPY WITH PROPOFOL N/A 07/01/2017   Procedure: COLONOSCOPY WITH PROPOFOL;  Surgeon: Manya Silvas, MD;  Location: [facility] ENDOSCOPY;  Service: Endoscopy;  Laterality: N/A;  . HEMORRHOID SURGERY      Current Medications: Current Meds  Medication Sig  . acetaminophen (TYLENOL) 500 MG tablet Take 500 mg by mouth every 6 (six) hours as needed.  Marland Kitchen aspirin EC 81 MG EC tablet Take 1 tablet (81 mg total) by mouth daily. Swallow whole.  Marland Kitchen atorvastatin (LIPITOR) 40 MG tablet Take 1 tablet (  40 mg total) by mouth daily at 6 PM.  . clopidogrel (PLAVIX) 75 MG tablet Take 1 tablet (75 mg total) by mouth daily for 21 doses.  . famotidine (PEPCID) 20 MG tablet Take 20 mg by mouth daily.  . furosemide (LASIX) 20 MG tablet Take 1 tablet (20 mg total) by mouth daily.  Marland Kitchen gabapentin (NEURONTIN) 100 MG capsule Take 100 mg by mouth daily.  Marland Kitchen losartan (COZAAR) 25 MG tablet Take 0.5 tablets (12.5 mg total) by mouth at bedtime.  . metoprolol  succinate (TOPROL-XL) 25 MG 24 hr tablet Take 0.5 tablets (12.5 mg total) by mouth daily.     Allergies:   Patient has no known allergies.   Social History   Socioeconomic History  . Marital status: Married    Spouse name: Not on file  . Number of children: Not on file  . Years of education: Not on file  . Highest education level: Not on file  Occupational History  . Not on file  Tobacco Use  . Smoking status: Former Smoker    Types: Cigarettes    Quit date: 11/26/1978    Years since quitting: 42.0  . Smokeless tobacco: Never Used  . Tobacco comment: quit 40 years ago  Vaping Use  . Vaping Use: Never used  Substance and Sexual Activity  . Alcohol use: Yes    Alcohol/week: 6.0 standard drinks    Types: 6 Cans of beer per week    Comment: occasional  . Drug use: No  . Sexual activity: Never  Other Topics Concern  . Not on file  Social History Narrative  . Not on file   Social Determinants of Health   Financial Resource Strain: Not on file  Food Insecurity: Not on file  Transportation Needs: Not on file  Physical Activity: Not on file  Stress: Not on file  Social Connections: Not on file     Family History: The patient's family history includes Heart attack in his mother; Heart attack (age of onset: 69) in his nephew; Heart attack (age of onset: 48) in his brother; Heart disease in his brother.  ROS:   Please see the history of present illness.     All other systems reviewed and are negative.  EKGs/Labs/Other Studies Reviewed:    The following studies were reviewed today:   EKG:  EKG is  ordered today.  The ekg ordered today demonstrates sinus rhythm, PVCs.  Recent Labs: 11/03/2020: ALT 24; Hemoglobin 13.8; Magnesium 2.2; Platelets 101 11/04/2020: B Natriuretic Peptide 710.8; BUN 20; Creatinine, Ser 1.19; Potassium 4.1; Sodium 139  Recent Lipid Panel    Component Value Date/Time   CHOL 137 11/04/2020 0526   TRIG 68 11/04/2020 0526   HDL 48 11/04/2020 0526    CHOLHDL 2.9 11/04/2020 0526   VLDL 14 11/04/2020 0526   LDLCALC 75 11/04/2020 0526     Risk Assessment/Calculations:      Physical Exam:    VS:  BP 108/62 (BP Location: Right Arm, Patient Position: Sitting, Cuff Size: Normal)   Pulse 70   Ht 5\' 6"  (1.676 m)   Wt 207 lb 6 oz (94.1 kg)   SpO2 98%   BMI 33.47 kg/m     Wt Readings from Last 3 Encounters:  11/24/20 207 lb 6 oz (94.1 kg)  11/21/20 205 lb 6 oz (93.2 kg)  11/04/20 199 lb 8.3 oz (90.5 kg)     GEN:  Well nourished, well developed in no acute distress HEENT: Normal NECK:  No JVD; No carotid bruits LYMPHATICS: No lymphadenopathy CARDIAC: RRR, no murmurs, rubs, gallops RESPIRATORY:  Clear to auscultation without rales, wheezing or rhonchi  ABDOMEN: Soft, non-tender, non-distended MUSCULOSKELETAL:  No edema; No deformity  SKIN: Warm and dry NEUROLOGIC:  Alert and oriented x 3 PSYCHIATRIC:  Normal affect   ASSESSMENT:    1. Cerebrovascular accident (CVA), unspecified mechanism (HCC)   2. HFrEF (heart failure with reduced ejection fraction) (HCC)    PLAN:    In order of problems listed above:  1. Recent stroke, 2 left MCA cortical territory.  Cardiac monitor previously ordered by neurology.  Continue aspirin Plavix, Lipitor as per neurology recs.  Moderate to severely reduced EF, no thrombus.  We will plan for TEE to evaluate thrombus.  Placed 2-week cardiac monitor to evaluate for arrhythmia such as A. fib or flutter.  Orthostatic vitals do not show orthostasis. 2. Recent echo with EF 30 to 35%.  Plan to obtain right and left heart cath at a future date after TEE has been performed.  Continue Toprol-XL, losartan for now.  Continue Lasix 20 mg daily.  Blood pressures low normal, preventing up titration of CHF meds.  Plan for referral to advanced heart failure clinic in the future after right and left heart cath.  Follow-up in 6 weeks.  Total encounter time more than 80 minutes  Greater than 50% was spent in  counseling and coordination of care with the patient    Shared Decision Making/Informed Consent The risks [esophageal damage, perforation (1:10,000 risk), bleeding, pharyngeal hematoma as well as other potential complications associated with conscious sedation including aspiration, arrhythmia, respiratory failure and death], benefits (treatment guidance and diagnostic support) and alternatives of a transesophageal echocardiogram were discussed in detail with Mr. Ekstein and he is willing to proceed.       Medication Adjustments/Labs and Tests Ordered: Current medicines are reviewed at length with the patient today.  Concerns regarding medicines are outlined above.  Orders Placed This Encounter  Procedures  . CBC  . Basic metabolic panel  . LONG TERM MONITOR (3-14 DAYS)  . EKG 12-Lead   No orders of the defined types were placed in this encounter.   Patient Instructions  Medication Instructions:  Your physician recommends that you continue on your current medications as directed. Please refer to the Current Medication list given to you today. *If you need a refill on your cardiac medications before your next appointment, please call your pharmacy*   Lab Work: BMP, CBC to be drawn today.  If you have labs (blood work) drawn today and your tests are completely normal, you will receive your results only by: Marland Kitchen MyChart Message (if you have MyChart) OR . A paper copy in the mail If you have any lab test that is abnormal or we need to change your treatment, we will call you to review the results.   Testing/Procedures:  1.  You are scheduled for a TEE on ________________ with Dr.__Agbor-Etang_________ Please arrive [facility] at _________ a.m. on the day of your procedure.  DIET INSTRUCTIONS:   Nothing to eat or drink after midnight except your medications with a sip of water.        1) Medications:  YOU MAY TAKE ALL of your remaining medications with a  small amount of water.  2) Must have a responsible person to drive you home.  3) Bring a current list of your medications and current insurance cards.    If you  have any questions after you get home, please call the office at [phone]    2.  Your physician has recommended that you wear a Zio monitor. This monitor is a medical device that records the heart's electrical activity. Doctors most often use these monitors to diagnose arrhythmias. Arrhythmias are problems with the speed or rhythm of the heartbeat. The monitor is a small device applied to your chest. You can wear one while you do your normal daily activities. While wearing this monitor if you have any symptoms to push the button and record what you felt. Once you have worn this monitor for the period of time provider prescribed (Usually 14 days), you will return the monitor device in the postage paid box. Once it is returned they will download the data collected and provide [place] with a report which the provider will then review and we will call you with those results. Important tips:  1. Avoid showering during the first 24 hours of wearing the monitor. 2. Avoid excessive sweating to help maximize wear time. 3. Do not submerge the device, no hot tubs, and no swimming pools. 4. Keep any lotions or oils away from the patch. 5. After 24 hours you may shower with the patch on. Take brief showers with your back facing the shower head.  6. Do not remove patch once it has been placed because that will interrupt data and decrease adhesive wear time. 7. Push the button when you have any symptoms and write down what you were feeling. 8. Once you have completed wearing your monitor, remove and place into box which has postage paid and place in your outgoing mailbox.  9. If for some reason you have misplaced your box then call our office and we can provide another box and/or mail it off for you.         Follow-Up: At [facility], you and  your health needs are our priority.  As part of our continuing mission to provide you with exceptional heart care, we have created designated Provider Care Teams.  These Care Teams include your primary Cardiologist (physician) and Advanced Practice Providers (APPs -  Physician Assistants and Nurse Practitioners) who all work together to provide you with the care you need, when you need it.  We recommend signing up for the patient portal called "MyChart".  Sign up information is provided on this After Visit Summary.  MyChart is used to connect with patients for Virtual Visits (Telemedicine).  Patients are able to view lab/test results, encounter notes, upcoming appointments, etc.  Non-urgent messages can be sent to your provider as well.   To learn more about what you can do with MyChart, go to [email or web address].    Your next appointment:   6 week(s)  The format for your next appointment:   In Person  Provider:   Kate Sable, MD   Other Instructions      Signed, Kate Sable, MD  11/24/2020 1:09 PM    [place]

## 2020-11-24 NOTE — Patient Instructions (Addendum)
Medication Instructions:  Your physician recommends that you continue on your current medications as directed. Please refer to the Current Medication list given to you today. *If you need a refill on your cardiac medications before your next appointment, please call your pharmacy*   Lab Work: BMP, CBC to be drawn today.  If you have labs (blood work) drawn today and your tests are completely normal, you will receive your results only by: Marland Kitchen MyChart Message (if you have MyChart) OR . A paper copy in the mail If you have any lab test that is abnormal or we need to change your treatment, we will call you to review the results.   Testing/Procedures:  1.  You are scheduled for a TEE on ________________ with Dr.__Agbor-Etang_________ Please arrive at the [place] [facility] at _________ a.m. on the day of your procedure.  DIET INSTRUCTIONS:   Nothing to eat or drink after midnight except your medications with a sip of water.        1) Medications:  YOU MAY TAKE ALL of your remaining medications with a small amount of water.  2) Must have a responsible person to drive you home.  3) Bring a current list of your medications and current insurance cards.    If you have any questions after you get home, please call the office at [phone]    2.  Your physician has recommended that you wear a Zio monitor. This monitor is a medical device that records the heart's electrical activity. Doctors most often use these monitors to diagnose arrhythmias. Arrhythmias are problems with the speed or rhythm of the heartbeat. The monitor is a small device applied to your chest. You can wear one while you do your normal daily activities. While wearing this monitor if you have any symptoms to push the button and record what you felt. Once you have worn this monitor for the period of time provider prescribed (Usually 14 days), you will return the monitor device in the postage paid box. Once it is returned they  will download the data collected and provide [place] with a report which the provider will then review and we will call you with those results. Important tips:  1. Avoid showering during the first 24 hours of wearing the monitor. 2. Avoid excessive sweating to help maximize wear time. 3. Do not submerge the device, no hot tubs, and no swimming pools. 4. Keep any lotions or oils away from the patch. 5. After 24 hours you may shower with the patch on. Take brief showers with your back facing the shower head.  6. Do not remove patch once it has been placed because that will interrupt data and decrease adhesive wear time. 7. Push the button when you have any symptoms and write down what you were feeling. 8. Once you have completed wearing your monitor, remove and place into box which has postage paid and place in your outgoing mailbox.  9. If for some reason you have misplaced your box then call our office and we can provide another box and/or mail it off for you.         Follow-Up: At [facility], you and your health needs are our priority.  As part of our continuing mission to provide you with exceptional heart care, we have created designated Provider Care Teams.  These Care Teams include your primary Cardiologist (physician) and Advanced Practice Providers (APPs -  Physician Assistants and Nurse Practitioners) who all work together to  provide you with the care you need, when you need it.  We recommend signing up for the patient portal called "MyChart".  Sign up information is provided on this After Visit Summary.  MyChart is used to connect with patients for Virtual Visits (Telemedicine).  Patients are able to view lab/test results, encounter notes, upcoming appointments, etc.  Non-urgent messages can be sent to your provider as well.   To learn more about what you can do with MyChart, go to [email or web address].    Your next appointment:   6 week(s)  The format for your next  appointment:   In Person  Provider:   Debbe Odea, MD   Other Instructions

## 2020-11-25 LAB — CBC
Hematocrit: 40.1 % (ref 37.5–51.0)
Hemoglobin: 13.8 g/dL (ref 13.0–17.7)
MCH: 32.2 pg (ref 26.6–33.0)
MCHC: 34.4 g/dL (ref 31.5–35.7)
MCV: 94 fL (ref 79–97)
Platelets: 106 10*3/uL — ABNORMAL LOW (ref 150–450)
RBC: 4.28 x10E6/uL (ref 4.14–5.80)
RDW: 12.4 % (ref 11.6–15.4)
WBC: 6.1 10*3/uL (ref 3.4–10.8)

## 2020-11-25 LAB — BASIC METABOLIC PANEL
BUN/Creatinine Ratio: 14 (ref 10–24)
BUN: 27 mg/dL (ref 8–27)
CO2: 23 mmol/L (ref 20–29)
Calcium: 9 mg/dL (ref 8.6–10.2)
Chloride: 106 mmol/L (ref 96–106)
Creatinine, Ser: 1.94 mg/dL — ABNORMAL HIGH (ref 0.76–1.27)
GFR calc Af Amer: 37 mL/min/{1.73_m2} — ABNORMAL LOW (ref >59)
GFR calc non Af Amer: 32 mL/min/{1.73_m2} — ABNORMAL LOW (ref >59)
Glucose: 116 mg/dL — ABNORMAL HIGH (ref 65–99)
Potassium: 4.6 mmol/L (ref 3.5–5.2)
Sodium: 144 mmol/L (ref 134–144)

## 2020-11-28 ENCOUNTER — Other Ambulatory Visit
Admission: RE | Admit: 2020-11-28 | Discharge: 2020-11-28 | Disposition: A | Discharge status: Home / Self Care | Payer: PPO | Source: Ambulatory Visit | Attending: Cardiology | Admitting: Cardiology

## 2020-11-28 DIAGNOSIS — Z20822 Contact with and (suspected) exposure to covid-19: Secondary | ICD-10-CM | POA: Insufficient documentation

## 2020-11-28 DIAGNOSIS — Z01812 Encounter for preprocedural laboratory examination: Secondary | ICD-10-CM | POA: Insufficient documentation

## 2020-11-28 LAB — SARS CORONAVIRUS 2 (TAT 6-24 HRS): SARS Coronavirus 2: NEGATIVE

## 2020-11-30 ENCOUNTER — Ambulatory Visit
Admission: RE | Admit: 2020-11-30 | Discharge: 2020-11-30 | Disposition: A | Discharge status: Home / Self Care | Payer: PPO | Attending: Cardiology | Admitting: Cardiology

## 2020-11-30 ENCOUNTER — Ambulatory Visit (HOSPITAL_BASED_OUTPATIENT_CLINIC_OR_DEPARTMENT_OTHER)
Admission: RE | Admit: 2020-11-30 | Discharge: 2020-11-30 | Disposition: A | Discharge status: Home / Self Care | Payer: PPO | Source: Home / Self Care | Attending: Cardiology | Admitting: Cardiology

## 2020-11-30 ENCOUNTER — Encounter: Payer: Self-pay | Admitting: Cardiology

## 2020-11-30 ENCOUNTER — Other Ambulatory Visit: Payer: Self-pay

## 2020-11-30 ENCOUNTER — Encounter
Admission: RE | Disposition: A | Discharge status: Home / Self Care | Payer: Self-pay | Source: Home / Self Care | Attending: Cardiology

## 2020-11-30 DIAGNOSIS — I361 Nonrheumatic tricuspid (valve) insufficiency: Secondary | ICD-10-CM | POA: Diagnosis not present

## 2020-11-30 DIAGNOSIS — Z7982 Long term (current) use of aspirin: Secondary | ICD-10-CM | POA: Diagnosis not present

## 2020-11-30 DIAGNOSIS — I130 Hypertensive heart and chronic kidney disease with heart failure and stage 1 through stage 4 chronic kidney disease, or unspecified chronic kidney disease: Secondary | ICD-10-CM | POA: Diagnosis not present

## 2020-11-30 DIAGNOSIS — I5020 Unspecified systolic (congestive) heart failure: Secondary | ICD-10-CM | POA: Insufficient documentation

## 2020-11-30 DIAGNOSIS — N189 Chronic kidney disease, unspecified: Secondary | ICD-10-CM | POA: Insufficient documentation

## 2020-11-30 DIAGNOSIS — Z87891 Personal history of nicotine dependence: Secondary | ICD-10-CM | POA: Diagnosis not present

## 2020-11-30 DIAGNOSIS — I639 Cerebral infarction, unspecified: Secondary | ICD-10-CM

## 2020-11-30 DIAGNOSIS — Z79899 Other long term (current) drug therapy: Secondary | ICD-10-CM | POA: Insufficient documentation

## 2020-11-30 DIAGNOSIS — Z7902 Long term (current) use of antithrombotics/antiplatelets: Secondary | ICD-10-CM | POA: Insufficient documentation

## 2020-11-30 DIAGNOSIS — Z8249 Family history of ischemic heart disease and other diseases of the circulatory system: Secondary | ICD-10-CM | POA: Insufficient documentation

## 2020-11-30 DIAGNOSIS — I340 Nonrheumatic mitral (valve) insufficiency: Secondary | ICD-10-CM

## 2020-11-30 DIAGNOSIS — R55 Syncope and collapse: Secondary | ICD-10-CM | POA: Diagnosis not present

## 2020-11-30 DIAGNOSIS — I6389 Other cerebral infarction: Secondary | ICD-10-CM | POA: Diagnosis not present

## 2020-11-30 HISTORY — PX: TEE WITHOUT CARDIOVERSION: SHX5443

## 2020-11-30 SURGERY — ECHOCARDIOGRAM, TRANSESOPHAGEAL
Anesthesia: Moderate Sedation

## 2020-11-30 MED ORDER — FENTANYL CITRATE (PF) 100 MCG/2ML IJ SOLN
INTRAMUSCULAR | Status: AC | PRN
  Administered 2020-11-30 (×2): 25 ug via INTRAVENOUS

## 2020-11-30 MED ORDER — MIDAZOLAM HCL 5 MG/5ML IJ SOLN
INTRAMUSCULAR | Status: AC | PRN
Start: 2020-11-30 — End: 2020-11-30
  Administered 2020-11-30: 1 mg via INTRAVENOUS

## 2020-11-30 MED ORDER — MIDAZOLAM HCL 5 MG/5ML IJ SOLN
INTRAMUSCULAR | Status: AC
  Filled 2020-11-30: qty 5

## 2020-11-30 MED ORDER — LIDOCAINE VISCOUS HCL 2 % MT SOLN
OROMUCOSAL | Status: AC | PRN
  Administered 2020-11-30 (×2): 15 mL via OROMUCOSAL

## 2020-11-30 MED ORDER — BUTAMBEN-TETRACAINE-BENZOCAINE 2-2-14 % EX AERO
INHALATION_SPRAY | CUTANEOUS | Status: AC
  Filled 2020-11-30: qty 5

## 2020-11-30 MED ORDER — LIDOCAINE VISCOUS HCL 2 % MT SOLN
OROMUCOSAL | Status: AC
  Filled 2020-11-30: qty 15

## 2020-11-30 MED ORDER — FENTANYL CITRATE (PF) 100 MCG/2ML IJ SOLN
INTRAMUSCULAR | Status: AC
  Filled 2020-11-30: qty 2

## 2020-11-30 MED ORDER — SODIUM CHLORIDE 0.9 % IV SOLN: INTRAVENOUS | Status: DC

## 2020-11-30 MED ORDER — SODIUM CHLORIDE FLUSH 0.9 % IV SOLN
INTRAVENOUS | Status: AC
  Filled 2020-11-30: qty 10

## 2020-11-30 NOTE — Interval H&P Note (Signed)
History and Physical Interval Note:  11/30/2020 9:31 AM  Gabriel Martin  has presented today for surgery, with the diagnosis of Stroke.  The various methods of treatment have been discussed with the patient and family. After consideration of risks, benefits and other options for treatment, the patient has consented to  Procedure(s): TRANSESOPHAGEAL ECHOCARDIOGRAM (TEE) (N/A) as a surgical intervention.  The patient's history has been reviewed, patient examined, no change in status, stable for surgery.  I have reviewed the patient's chart and labs.  Questions were answered to the patient's satisfaction.     Arlys [place] Agbor-Etang

## 2020-11-30 NOTE — Procedures (Signed)
Transesophageal Echocardiogram :  Indication:Stroke  Procedure: 10cc lidocaine were given orally to provide local anesthesia to the oropharynx. The patient was positioned supine on the left side, bite block provided. The patient was moderately sedated with the doses of versed and fentanyl as detailed below.  Using digital technique an omniplane probe was advanced into the esophagus without incident.   Moderate sedation: 1. Sedation used:  Versed: 1mg , Fentanyl: 2. Time administered: 9:06    Time when patient started recovery: 9:23 3. I was face to face during this time  See report in EPIC  for complete details: In brief, imaging revealed mod to severely reduced LV function, global hypokinesis, no mural apical thrombus. Estimated ejection fraction was 30-35%.  RV function was mildly reduced  Imaging of the septum showed no ASD or VSD Bubble study was negative for shunt 2D and color flow confirmed no PFO  The LA was well visualized in orthogonal views.  There was no spontaneous contrast and no thrombus in the LA and LA appendage   The descending thoracic aorta had no  mural aortic debris with no evidence of aneurysmal dilation or disection   11/30/2020 9:33 AM

## 2020-11-30 NOTE — Progress Notes (Signed)
*  PRELIMINARY RESULTS* Echocardiogram Echocardiogram Transesophageal has been performed.  Gabriel Martin 11/30/2020, 9:45 AM

## 2020-12-08 DIAGNOSIS — I5020 Unspecified systolic (congestive) heart failure: Secondary | ICD-10-CM

## 2020-12-08 DIAGNOSIS — I471 Supraventricular tachycardia: Secondary | ICD-10-CM | POA: Diagnosis not present

## 2020-12-14 DIAGNOSIS — I5020 Unspecified systolic (congestive) heart failure: Secondary | ICD-10-CM | POA: Diagnosis not present

## 2020-12-21 DIAGNOSIS — L84 Corns and callosities: Secondary | ICD-10-CM | POA: Diagnosis not present

## 2020-12-21 DIAGNOSIS — D2261 Melanocytic nevi of right upper limb, including shoulder: Secondary | ICD-10-CM | POA: Diagnosis not present

## 2020-12-21 DIAGNOSIS — X32XXXA Exposure to sunlight, initial encounter: Secondary | ICD-10-CM | POA: Diagnosis not present

## 2020-12-21 DIAGNOSIS — L570 Actinic keratosis: Secondary | ICD-10-CM | POA: Diagnosis not present

## 2020-12-21 DIAGNOSIS — D2272 Melanocytic nevi of left lower limb, including hip: Secondary | ICD-10-CM | POA: Diagnosis not present

## 2020-12-21 DIAGNOSIS — D2271 Melanocytic nevi of right lower limb, including hip: Secondary | ICD-10-CM | POA: Diagnosis not present

## 2020-12-21 DIAGNOSIS — L821 Other seborrheic keratosis: Secondary | ICD-10-CM | POA: Diagnosis not present

## 2020-12-21 DIAGNOSIS — D225 Melanocytic nevi of trunk: Secondary | ICD-10-CM | POA: Diagnosis not present

## 2020-12-21 DIAGNOSIS — D2262 Melanocytic nevi of left upper limb, including shoulder: Secondary | ICD-10-CM | POA: Diagnosis not present

## 2020-12-26 DIAGNOSIS — H43813 Vitreous degeneration, bilateral: Secondary | ICD-10-CM | POA: Diagnosis not present

## 2021-01-03 ENCOUNTER — Ambulatory Visit: Payer: PPO | Admitting: Family

## 2021-01-05 ENCOUNTER — Encounter: Payer: Self-pay | Admitting: Cardiology

## 2021-01-05 ENCOUNTER — Other Ambulatory Visit: Payer: Self-pay

## 2021-01-05 ENCOUNTER — Other Ambulatory Visit
Admission: RE | Admit: 2021-01-05 | Discharge: 2021-01-05 | Disposition: A | Discharge status: Home / Self Care | Payer: PPO | Source: Ambulatory Visit | Attending: Cardiology | Admitting: Cardiology

## 2021-01-05 ENCOUNTER — Ambulatory Visit (INDEPENDENT_AMBULATORY_CARE_PROVIDER_SITE_OTHER): Payer: PPO | Admitting: Cardiology

## 2021-01-05 VITALS — BP 128/78 | HR 70 | Ht 66.0 in | Wt 209.1 lb

## 2021-01-05 DIAGNOSIS — I5020 Unspecified systolic (congestive) heart failure: Secondary | ICD-10-CM | POA: Diagnosis not present

## 2021-01-05 DIAGNOSIS — I639 Cerebral infarction, unspecified: Secondary | ICD-10-CM

## 2021-01-05 DIAGNOSIS — Z01812 Encounter for preprocedural laboratory examination: Secondary | ICD-10-CM | POA: Insufficient documentation

## 2021-01-05 DIAGNOSIS — Z20822 Contact with and (suspected) exposure to covid-19: Secondary | ICD-10-CM | POA: Insufficient documentation

## 2021-01-05 LAB — SARS CORONAVIRUS 2 (TAT 6-24 HRS): SARS Coronavirus 2: NEGATIVE

## 2021-01-05 NOTE — Progress Notes (Signed)
Cardiology Office Note:    Date:  01/05/2021   ID:  Gabriel Martin, DOB [birth year], MRN [id number]  PCP:  [name], MD  [place] Cardiologist:  No primary care provider on file.  [facility] Electrophysiologist:  None   Referring MD: [name], MD   Chief Complaint  Patient presents with  . Other    6 wk f/u TEE/zio. Meds reviewed verbally with pt.     History of Present Illness:    Gabriel Martin is a 80 y.o. male with a hx of hypertension, HFrEF, EF 30 to 35%, CVA 10/2019 who presents for follow-up.  Last seen due to syncope, recent CVA and HFrEF.  Underwent a TEE to evaluate presence of thrombus, which was negative.  Cardiac monitor x2 weeks did not show any evidence of atrial fibrillation or atrial flutter.  Currently takes aspirin, Lipitor due to stroke.  Feels okay, denies edema, shortness of breath, chest pain.   Prior notes  Patient presented to the ED on 12/8 due to confusion, lightheadedness and loss consciousness, falling to the ground and hitting head.  In the ED, work-up with an MRI showed 2 acute left MCA territory cortical infarcts.  He was started on aspirin, Plavix, Lipitor.  Echocardiogram showed moderate to severely reduced ejection fraction, EF 30 to 35%, global hypokinesis.    He has a family history of MI in the brother age 11.  Past Medical History:  Diagnosis Date  . Acid reflux 05/12/2015  . Anemia   . Arthritis    finger thumb  . Benign enlargement of prostate   . Benign neoplasm of large bowel   . Benign prostatic hyperplasia with urinary obstruction 05/12/2015  . BPH (benign prostatic hyperplasia)   . BPH with obstruction/lower urinary tract symptoms 05/12/2015  . Change in blood platelet count 05/12/2015  . CHF (congestive heart failure) ([place])   . Chronic kidney disease 05/12/2015  . GERD (gastroesophageal reflux disease)   . HLD (hyperlipidemia) 05/12/2015  . Hyperlipidemia   . Nocturia associated with benign  prostatic hypertrophy   . Penile ulcer 05/12/2015  . Skin cancer   . Stroke ([place])   . Thrombocytopenia ([place])     Past Surgical History:  Procedure Laterality Date  . CATARACT EXTRACTION W/PHACO Left 02/29/2016   Procedure: CATARACT EXTRACTION PHACO AND INTRAOCULAR LENS PLACEMENT ([place]) left;  Surgeon: Leandrew Koyanagi, MD;  Location: [place];  Service: Ophthalmology;  Laterality: Left;  . CATARACT EXTRACTION W/PHACO Right 04/25/2016   Procedure: CATARACT EXTRACTION PHACO AND INTRAOCULAR LENS PLACEMENT (IOC);  Surgeon: Leandrew Koyanagi, MD;  Location: [place];  Service: Ophthalmology;  Laterality: Right;  . COLONOSCOPY WITH PROPOFOL N/A 07/01/2017   Procedure: COLONOSCOPY WITH PROPOFOL;  Surgeon: Manya Silvas, MD;  Location: [facility] ENDOSCOPY;  Service: Endoscopy;  Laterality: N/A;  . EYE SURGERY    . HEMORRHOID SURGERY    . TEE WITHOUT CARDIOVERSION N/A 11/30/2020   Procedure: TRANSESOPHAGEAL ECHOCARDIOGRAM (TEE);  Surgeon: Kate Sable, MD;  Location: [facility] ORS;  Service: Cardiovascular;  Laterality: N/A;    Current Medications: Current Meds  Medication Sig  . acetaminophen (TYLENOL) 500 MG tablet Take 500 mg by mouth every 6 (six) hours as needed.  Marland Kitchen aspirin EC 81 MG EC tablet Take 1 tablet (81 mg total) by mouth daily. Swallow whole.  Marland Kitchen atorvastatin (LIPITOR) 40 MG tablet Take 1 tablet (40 mg total) by mouth daily at 6 PM.  . famotidine (PEPCID) 20 MG tablet Take 20 mg  by mouth daily.  . furosemide (LASIX) 20 MG tablet Take 1 tablet (20 mg total) by mouth daily.  Marland Kitchen gabapentin (NEURONTIN) 100 MG capsule Take 100 mg by mouth daily.  Marland Kitchen losartan (COZAAR) 25 MG tablet Take 0.5 tablets (12.5 mg total) by mouth at bedtime.  . metoprolol succinate (TOPROL-XL) 25 MG 24 hr tablet Take 0.5 tablets (12.5 mg total) by mouth daily.     Allergies:   Patient has no known allergies.   Social History   Socioeconomic History  . Marital status: Married    Spouse  name: Not on file  . Number of children: Not on file  . Years of education: Not on file  . Highest education level: Not on file  Occupational History  . Not on file  Tobacco Use  . Smoking status: Former Smoker    Types: Cigarettes    Quit date: 11/26/1978    Years since quitting: 42.1  . Smokeless tobacco: Never Used  . Tobacco comment: quit 40 years ago  Vaping Use  . Vaping Use: Never used  Substance and Sexual Activity  . Alcohol use: Yes    Alcohol/week: 6.0 standard drinks    Types: 6 Cans of beer per week    Comment: occasional  . Drug use: No  . Sexual activity: Never  Other Topics Concern  . Not on file  Social History Narrative  . Not on file   Social Determinants of Health   Financial Resource Strain: Not on file  Food Insecurity: Not on file  Transportation Needs: Not on file  Physical Activity: Not on file  Stress: Not on file  Social Connections: Not on file     Family History: The patient's family history includes Heart attack in his mother; Heart attack (age of onset: 52) in his nephew; Heart attack (age of onset: 63) in his brother; Heart disease in his brother.  ROS:   Please see the history of present illness.     All other systems reviewed and are negative.  EKGs/Labs/Other Studies Reviewed:    The following studies were reviewed today:   EKG:  EKG is  ordered today.  The ekg ordered today demonstrates sinus rhythm, PVCs.  Recent Labs: 11/03/2020: ALT 24; Magnesium 2.2 11/04/2020: B Natriuretic Peptide 710.8 11/24/2020: BUN 27; Creatinine, Ser 1.94; Hemoglobin 13.8; Platelets 106; Potassium 4.6; Sodium 144  Recent Lipid Panel    Component Value Date/Time   CHOL 137 11/04/2020 0526   TRIG 68 11/04/2020 0526   HDL 48 11/04/2020 0526   CHOLHDL 2.9 11/04/2020 0526   VLDL 14 11/04/2020 0526   LDLCALC 75 11/04/2020 0526     Risk Assessment/Calculations:      Physical Exam:    VS:  BP 128/78 (BP Location: Left Arm, Patient Position:  Sitting, Cuff Size: Normal)   Pulse 70   Ht 5\' 6"  (1.676 m)   Wt 209 lb 2 oz (94.9 kg)   SpO2 98%   BMI 33.75 kg/m     Wt Readings from Last 3 Encounters:  01/05/21 209 lb 2 oz (94.9 kg)  11/30/20 200 lb (90.7 kg)  11/24/20 207 lb 6 oz (94.1 kg)     GEN:  Well nourished, well developed in no acute distress HEENT: Normal NECK: No JVD; No carotid bruits LYMPHATICS: No lymphadenopathy CARDIAC: RRR, no murmurs, rubs, gallops RESPIRATORY:  Clear to auscultation without rales, wheezing or rhonchi  ABDOMEN: Soft, non-tender, non-distended MUSCULOSKELETAL:  No edema; No deformity  SKIN: Warm and  dry NEUROLOGIC:  Alert and oriented x 3 PSYCHIATRIC:  Normal affect   ASSESSMENT:    1. Cerebrovascular accident (CVA), unspecified mechanism ([place])   2. HFrEF (heart failure with reduced ejection fraction) ([place])    PLAN:    In order of problems listed above:  1. Recent stroke, 2 left MCA cortical territory.  Cardiac monitor with no evidence for A. fib or flutter.  TEE no thrombus or intra-atrial shunt.  Continue aspirin Plavix, Lipitor as per neurology recs.   2. echo with EF 30 to 35%.  Evaluate for CAD/ischemia with right and left heart cath.  Patient appears euvolemic, continue Toprol-XL and losartan.  Plan for referral to advanced heart failure clinic after heart cath.  Follow-up after left heart cath.  Total encounter time more than 40 minutes  Greater than 50% was spent in counseling and coordination of care with the patient  Shared Decision Making/Informed Consent The risks [stroke (1 in 1000), death (1 in 1000), kidney failure [usually temporary] (1 in 500), bleeding (1 in 200), allergic reaction [possibly serious] (1 in 200)], benefits (diagnostic support and management of coronary artery disease) and alternatives of a cardiac catheterization were discussed in detail with Mr. Finnigan and he is willing to proceed.   Medication Adjustments/Labs and Tests Ordered: Current  medicines are reviewed at length with the patient today.  Concerns regarding medicines are outlined above.  Orders Placed This Encounter  Procedures  . Basic metabolic panel  . CBC  . EKG 12-Lead   No orders of the defined types were placed in this encounter.   Patient Instructions  Medication Instructions:  Your physician recommends that you continue on your current medications as directed. Please refer to the Current Medication list given to you today.  *If you need a refill on your cardiac medications before your next appointment, please call your pharmacy*   Lab Work: CBC, BMP drawn today.  If you have labs (blood work) drawn today and your tests are completely normal, you will receive your results only by: Marland Kitchen MyChart Message (if you have MyChart) OR . A paper copy in the mail If you have any lab test that is abnormal or we need to change your treatment, we will call you to review the results.   Testing/Procedures:   You are scheduled for a Cardiac Catheterization on Tuesday, February 15 with Dr. Harrell Gave End.  1. Please arrive at the [facility] at  7:30 AM (This time is one hour before your procedure to ensure your preparation). Free valet parking service is available.   Special note: Every effort is made to have your procedure done on time. Please understand that emergencies sometimes delay scheduled procedures.   2. Diet: Do not eat solid foods after midnight.  The patient may have clear liquids until 5am upon the day of the procedure.  3. Labs:  Please drive through our Covid Pre-Testing site [facility] on Friday 01/06/21 between the hours of 8am-1pm   4. Medication instructions in preparation for your procedure:  Hold your losartan (COZAAR), and your furosemide (LASIX) on the day of your procedure.  On the morning of your procedure, take your Aspirin and any morning medicines NOT listed above.  You may use sips of water.  5. Plan  for one night stay--bring personal belongings. 6. Bring a current list of your medications and current insurance cards. 7. You MUST have a responsible person to drive you home. 8. Someone  MUST be with you the first 24 hours after you arrive home or your discharge will be delayed. 9. Please wear clothes that are easy to get on and off and wear slip-on shoes.  Thank you for allowing [place] to care for you!   -- [place] [facility]    Follow-Up: At [facility], you and your health needs are our priority.  As part of our continuing mission to provide you with exceptional heart care, we have created designated Provider Care Teams.  These Care Teams include your primary Cardiologist (physician) and Advanced Practice Providers (APPs -  Physician Assistants and Nurse Practitioners) who all work together to provide you with the care you need, when you need it.  We recommend signing up for the patient portal called "MyChart".  Sign up information is provided on this After Visit Summary.  MyChart is used to connect with patients for Virtual Visits (Telemedicine).  Patients are able to view lab/test results, encounter notes, upcoming appointments, etc.  Non-urgent messages can be sent to your provider as well.   To learn more about what you can do with MyChart, go to [email or web address].    Your next appointment:   2-3 week(s)  The format for your next appointment:   In Person  Provider:   Kate Sable, MD   Other Instructions      Signed, Kate Sable, MD  01/05/2021 12:58 PM    [email or web address]

## 2021-01-05 NOTE — H&P (View-Only) (Signed)
Cardiology Office Note:    Date:  01/05/2021   ID:  [name], DOB [birth year], MRN [id number]  PCP:  [name], MD  [place] Cardiologist:  No primary care provider on file.  [facility] Electrophysiologist:  None   Referring MD: [name], MD   Chief Complaint  Patient presents with  . Other    6 wk f/u TEE/zio. Meds reviewed verbally with pt.     History of Present Illness:    Gabriel Martin is a 80 y.o. male with a hx of hypertension, HFrEF, EF 30 to 35%, CVA 10/2019 who presents for follow-up.  Last seen due to syncope, recent CVA and HFrEF.  Underwent a TEE to evaluate presence of thrombus, which was negative.  Cardiac monitor x2 weeks did not show any evidence of atrial fibrillation or atrial flutter.  Currently takes aspirin, Lipitor due to stroke.  Feels okay, denies edema, shortness of breath, chest pain.   Prior notes  Patient presented to the ED on 12/8 due to confusion, lightheadedness and loss consciousness, falling to the ground and hitting head.  In the ED, work-up with an MRI showed 2 acute left MCA territory cortical infarcts.  He was started on aspirin, Plavix, Lipitor.  Echocardiogram showed moderate to severely reduced ejection fraction, EF 30 to 35%, global hypokinesis.    He has a family history of MI in the brother age 44.  Past Medical History:  Diagnosis Date  . Acid reflux 05/12/2015  . Anemia   . Arthritis    finger thumb  . Benign enlargement of prostate   . Benign neoplasm of large bowel   . Benign prostatic hyperplasia with urinary obstruction 05/12/2015  . BPH (benign prostatic hyperplasia)   . BPH with obstruction/lower urinary tract symptoms 05/12/2015  . Change in blood platelet count 05/12/2015  . CHF (congestive heart failure) ([place])   . Chronic kidney disease 05/12/2015  . GERD (gastroesophageal reflux disease)   . HLD (hyperlipidemia) 05/12/2015  . Hyperlipidemia   . Nocturia associated with benign  prostatic hypertrophy   . Penile ulcer 05/12/2015  . Skin cancer   . Stroke ([place])   . Thrombocytopenia ([place])     Past Surgical History:  Procedure Laterality Date  . CATARACT EXTRACTION W/PHACO Left 02/29/2016   Procedure: CATARACT EXTRACTION PHACO AND INTRAOCULAR LENS PLACEMENT ([place]) left;  Surgeon: Leandrew Koyanagi, MD;  Location: [place];  Service: Ophthalmology;  Laterality: Left;  . CATARACT EXTRACTION W/PHACO Right 04/25/2016   Procedure: CATARACT EXTRACTION PHACO AND INTRAOCULAR LENS PLACEMENT (IOC);  Surgeon: Leandrew Koyanagi, MD;  Location: [place];  Service: Ophthalmology;  Laterality: Right;  . COLONOSCOPY WITH PROPOFOL N/A 07/01/2017   Procedure: COLONOSCOPY WITH PROPOFOL;  Surgeon: Manya Silvas, MD;  Location: [facility] ENDOSCOPY;  Service: Endoscopy;  Laterality: N/A;  . EYE SURGERY    . HEMORRHOID SURGERY    . TEE WITHOUT CARDIOVERSION N/A 11/30/2020   Procedure: TRANSESOPHAGEAL ECHOCARDIOGRAM (TEE);  Surgeon: Kate Sable, MD;  Location: [facility] ORS;  Service: Cardiovascular;  Laterality: N/A;    Current Medications: Current Meds  Medication Sig  . acetaminophen (TYLENOL) 500 MG tablet Take 500 mg by mouth every 6 (six) hours as needed.  Marland Kitchen aspirin EC 81 MG EC tablet Take 1 tablet (81 mg total) by mouth daily. Swallow whole.  Marland Kitchen atorvastatin (LIPITOR) 40 MG tablet Take 1 tablet (40 mg total) by mouth daily at 6 PM.  . famotidine (PEPCID) 20 MG tablet Take 20 mg  by mouth daily.  . furosemide (LASIX) 20 MG tablet Take 1 tablet (20 mg total) by mouth daily.  Marland Kitchen gabapentin (NEURONTIN) 100 MG capsule Take 100 mg by mouth daily.  Marland Kitchen losartan (COZAAR) 25 MG tablet Take 0.5 tablets (12.5 mg total) by mouth at bedtime.  . metoprolol succinate (TOPROL-XL) 25 MG 24 hr tablet Take 0.5 tablets (12.5 mg total) by mouth daily.     Allergies:   Patient has no known allergies.   Social History   Socioeconomic History  . Marital status: Married    Spouse  name: Not on file  . Number of children: Not on file  . Years of education: Not on file  . Highest education level: Not on file  Occupational History  . Not on file  Tobacco Use  . Smoking status: Former Smoker    Types: Cigarettes    Quit date: 11/26/1978    Years since quitting: 42.1  . Smokeless tobacco: Never Used  . Tobacco comment: quit 40 years ago  Vaping Use  . Vaping Use: Never used  Substance and Sexual Activity  . Alcohol use: Yes    Alcohol/week: 6.0 standard drinks    Types: 6 Cans of beer per week    Comment: occasional  . Drug use: No  . Sexual activity: Never  Other Topics Concern  . Not on file  Social History Narrative  . Not on file   Social Determinants of Health   Financial Resource Strain: Not on file  Food Insecurity: Not on file  Transportation Needs: Not on file  Physical Activity: Not on file  Stress: Not on file  Social Connections: Not on file     Family History: The patient's family history includes Heart attack in his mother; Heart attack (age of onset: 20) in his nephew; Heart attack (age of onset: 27) in his brother; Heart disease in his brother.  ROS:   Please see the history of present illness.     All other systems reviewed and are negative.  EKGs/Labs/Other Studies Reviewed:    The following studies were reviewed today:   EKG:  EKG is  ordered today.  The ekg ordered today demonstrates sinus rhythm, PVCs.  Recent Labs: 11/03/2020: ALT 24; Magnesium 2.2 11/04/2020: B Natriuretic Peptide 710.8 11/24/2020: BUN 27; Creatinine, Ser 1.94; Hemoglobin 13.8; Platelets 106; Potassium 4.6; Sodium 144  Recent Lipid Panel    Component Value Date/Time   CHOL 137 11/04/2020 0526   TRIG 68 11/04/2020 0526   HDL 48 11/04/2020 0526   CHOLHDL 2.9 11/04/2020 0526   VLDL 14 11/04/2020 0526   LDLCALC 75 11/04/2020 0526     Risk Assessment/Calculations:      Physical Exam:    VS:  BP 128/78 (BP Location: Left Arm, Patient Position:  Sitting, Cuff Size: Normal)   Pulse 70   Ht 5\' 6"  (1.676 m)   Wt 209 lb 2 oz (94.9 kg)   SpO2 98%   BMI 33.75 kg/m     Wt Readings from Last 3 Encounters:  01/05/21 209 lb 2 oz (94.9 kg)  11/30/20 200 lb (90.7 kg)  11/24/20 207 lb 6 oz (94.1 kg)     GEN:  Well nourished, well developed in no acute distress HEENT: Normal NECK: No JVD; No carotid bruits LYMPHATICS: No lymphadenopathy CARDIAC: RRR, no murmurs, rubs, gallops RESPIRATORY:  Clear to auscultation without rales, wheezing or rhonchi  ABDOMEN: Soft, non-tender, non-distended MUSCULOSKELETAL:  No edema; No deformity  SKIN: Warm and  dry NEUROLOGIC:  Alert and oriented x 3 PSYCHIATRIC:  Normal affect   ASSESSMENT:    1. Cerebrovascular accident (CVA), unspecified mechanism ([place])   2. HFrEF (heart failure with reduced ejection fraction) ([place])    PLAN:    In order of problems listed above:  1. Recent stroke, 2 left MCA cortical territory.  Cardiac monitor with no evidence for A. fib or flutter.  TEE no thrombus or intra-atrial shunt.  Continue aspirin Plavix, Lipitor as per neurology recs.   2. echo with EF 30 to 35%.  Evaluate for CAD/ischemia with right and left heart cath.  Patient appears euvolemic, continue Toprol-XL and losartan.  Plan for referral to advanced heart failure clinic after heart cath.  Follow-up after left heart cath.  Total encounter time more than 40 minutes  Greater than 50% was spent in counseling and coordination of care with the patient  Shared Decision Making/Informed Consent The risks [stroke (1 in 1000), death (1 in 1000), kidney failure [usually temporary] (1 in 500), bleeding (1 in 200), allergic reaction [possibly serious] (1 in 200)], benefits (diagnostic support and management of coronary artery disease) and alternatives of a cardiac catheterization were discussed in detail with Mr. Boen and he is willing to proceed.   Medication Adjustments/Labs and Tests Ordered: Current  medicines are reviewed at length with the patient today.  Concerns regarding medicines are outlined above.  Orders Placed This Encounter  Procedures  . Basic metabolic panel  . CBC  . EKG 12-Lead   No orders of the defined types were placed in this encounter.   Patient Instructions  Medication Instructions:  Your physician recommends that you continue on your current medications as directed. Please refer to the Current Medication list given to you today.  *If you need a refill on your cardiac medications before your next appointment, please call your pharmacy*   Lab Work: CBC, BMP drawn today.  If you have labs (blood work) drawn today and your tests are completely normal, you will receive your results only by: Marland Kitchen MyChart Message (if you have MyChart) OR . A paper copy in the mail If you have any lab test that is abnormal or we need to change your treatment, we will call you to review the results.   Testing/Procedures:   You are scheduled for a Cardiac Catheterization on Tuesday, February 15 with Dr. Harrell Gave End.  1. Please arrive at the [facility] at  7:30 AM (This time is one hour before your procedure to ensure your preparation). Free valet parking service is available.   Special note: Every effort is made to have your procedure done on time. Please understand that emergencies sometimes delay scheduled procedures.   2. Diet: Do not eat solid foods after midnight.  The patient may have clear liquids until 5am upon the day of the procedure.  3. Labs:  Please drive through our Covid Pre-Testing site [facility] on Friday 01/06/21 between the hours of 8am-1pm   4. Medication instructions in preparation for your procedure:  Hold your losartan (COZAAR), and your furosemide (LASIX) on the day of your procedure.  On the morning of your procedure, take your Aspirin and any morning medicines NOT listed above.  You may use sips of water.  5. Plan  for one night stay--bring personal belongings. 6. Bring a current list of your medications and current insurance cards. 7. You MUST have a responsible person to drive you home. 8. Someone  MUST be with you the first 24 hours after you arrive home or your discharge will be delayed. 9. Please wear clothes that are easy to get on and off and wear slip-on shoes.  Thank you for allowing [place] to care for you!   -- [place] [facility]    Follow-Up: At [facility], you and your health needs are our priority.  As part of our continuing mission to provide you with exceptional heart care, we have created designated Provider Care Teams.  These Care Teams include your primary Cardiologist (physician) and Advanced Practice Providers (APPs -  Physician Assistants and Nurse Practitioners) who all work together to provide you with the care you need, when you need it.  We recommend signing up for the patient portal called "MyChart".  Sign up information is provided on this After Visit Summary.  MyChart is used to connect with patients for Virtual Visits (Telemedicine).  Patients are able to view lab/test results, encounter notes, upcoming appointments, etc.  Non-urgent messages can be sent to your provider as well.   To learn more about what you can do with MyChart, go to [email or web address].    Your next appointment:   2-3 week(s)  The format for your next appointment:   In Person  Provider:   Kate Sable, MD   Other Instructions      Signed, Kate Sable, MD  01/05/2021 12:58 PM    [place]

## 2021-01-05 NOTE — Addendum Note (Signed)
Addended by: Kate Sable on: 01/05/2021 01:03 PM   Modules accepted: Orders, SmartSet

## 2021-01-05 NOTE — Patient Instructions (Signed)
Medication Instructions:  Your physician recommends that you continue on your current medications as directed. Please refer to the Current Medication list given to you today.  *If you need a refill on your cardiac medications before your next appointment, please call your pharmacy*   Lab Work: CBC, BMP drawn today.  If you have labs (blood work) drawn today and your tests are completely normal, you will receive your results only by: Marland Kitchen MyChart Message (if you have MyChart) OR . A paper copy in the mail If you have any lab test that is abnormal or we need to change your treatment, we will call you to review the results.   Testing/Procedures:   You are scheduled for a Cardiac Catheterization on Tuesday, February 15 with Dr. Harrell Gave End.  1. Please arrive at the [facility] at  7:30 AM (This time is one hour before your procedure to ensure your preparation). Free valet parking service is available.   Special note: Every effort is made to have your procedure done on time. Please understand that emergencies sometimes delay scheduled procedures.   2. Diet: Do not eat solid foods after midnight.  The patient may have clear liquids until 5am upon the day of the procedure.  3. Labs:  Please drive through our Covid Pre-Testing site in front  [facility] on Friday 01/06/21 between the hours of 8am-1pm   4. Medication instructions in preparation for your procedure:  Hold your losartan (COZAAR), and your furosemide (LASIX) on the day of your procedure.  On the morning of your procedure, take your Aspirin and any morning medicines NOT listed above.  You may use sips of water.  5. Plan for one night stay--bring personal belongings. 6. Bring a current list of your medications and current insurance cards. 7. You MUST have a responsible person to drive you home. 8. Someone MUST be with you the first 24 hours after you arrive home or your discharge will be delayed. 9. Please  wear clothes that are easy to get on and off and wear slip-on shoes.  Thank you for allowing [place] to care for you!   -- [facility]    Follow-Up: At [facility], you and your health needs are our priority.  As part of our continuing mission to provide you with exceptional heart care, we have created designated Provider Care Teams.  These Care Teams include your primary Cardiologist (physician) and Advanced Practice Providers (APPs -  Physician Assistants and Nurse Practitioners) who all work together to provide you with the care you need, when you need it.  We recommend signing up for the patient portal called "MyChart".  Sign up information is provided on this After Visit Summary.  MyChart is used to connect with patients for Virtual Visits (Telemedicine).  Patients are able to view lab/test results, encounter notes, upcoming appointments, etc.  Non-urgent messages can be sent to your provider as well.   To learn more about what you can do with MyChart, go to [email or web address].    Your next appointment:   2-3 week(s)  The format for your next appointment:   In Person  Provider:   Kate Sable, MD   Other Instructions

## 2021-01-06 LAB — BASIC METABOLIC PANEL
BUN/Creatinine Ratio: 19 (ref 10–24)
BUN: 25 mg/dL (ref 8–27)
CO2: 25 mmol/L (ref 20–29)
Calcium: 9.1 mg/dL (ref 8.6–10.2)
Chloride: 102 mmol/L (ref 96–106)
Creatinine, Ser: 1.29 mg/dL — ABNORMAL HIGH (ref 0.76–1.27)
GFR calc Af Amer: 60 mL/min/{1.73_m2} (ref >59)
GFR calc non Af Amer: 52 mL/min/{1.73_m2} — ABNORMAL LOW (ref >59)
Glucose: 96 mg/dL (ref 65–99)
Potassium: 4.3 mmol/L (ref 3.5–5.2)
Sodium: 141 mmol/L (ref 134–144)

## 2021-01-06 LAB — CBC
Hematocrit: 41.5 % (ref 37.5–51.0)
Hemoglobin: 13.8 g/dL (ref 13.0–17.7)
MCH: 31.5 pg (ref 26.6–33.0)
MCHC: 33.3 g/dL (ref 31.5–35.7)
MCV: 95 fL (ref 79–97)
Platelets: 102 10*3/uL — ABNORMAL LOW (ref 150–450)
RBC: 4.38 x10E6/uL (ref 4.14–5.80)
RDW: 12.8 % (ref 11.6–15.4)
WBC: 6.5 10*3/uL (ref 3.4–10.8)

## 2021-01-10 ENCOUNTER — Other Ambulatory Visit: Payer: Self-pay

## 2021-01-10 ENCOUNTER — Encounter: Payer: Self-pay | Admitting: Internal Medicine

## 2021-01-10 ENCOUNTER — Encounter
Admission: RE | Disposition: A | Discharge status: Home / Self Care | Payer: Self-pay | Source: Home / Self Care | Attending: Internal Medicine

## 2021-01-10 ENCOUNTER — Telehealth: Payer: Self-pay

## 2021-01-10 ENCOUNTER — Ambulatory Visit
Admission: RE | Admit: 2021-01-10 | Discharge: 2021-01-10 | Disposition: A | Discharge status: Home / Self Care | Payer: PPO | Attending: Internal Medicine | Admitting: Internal Medicine

## 2021-01-10 DIAGNOSIS — Z7982 Long term (current) use of aspirin: Secondary | ICD-10-CM | POA: Insufficient documentation

## 2021-01-10 DIAGNOSIS — I110 Hypertensive heart disease with heart failure: Secondary | ICD-10-CM | POA: Diagnosis not present

## 2021-01-10 DIAGNOSIS — I2510 Atherosclerotic heart disease of native coronary artery without angina pectoris: Secondary | ICD-10-CM | POA: Insufficient documentation

## 2021-01-10 DIAGNOSIS — I5022 Chronic systolic (congestive) heart failure: Secondary | ICD-10-CM | POA: Insufficient documentation

## 2021-01-10 DIAGNOSIS — Z87891 Personal history of nicotine dependence: Secondary | ICD-10-CM | POA: Diagnosis not present

## 2021-01-10 DIAGNOSIS — Z79899 Other long term (current) drug therapy: Secondary | ICD-10-CM | POA: Insufficient documentation

## 2021-01-10 DIAGNOSIS — Z8673 Personal history of transient ischemic attack (TIA), and cerebral infarction without residual deficits: Secondary | ICD-10-CM | POA: Insufficient documentation

## 2021-01-10 DIAGNOSIS — I5020 Unspecified systolic (congestive) heart failure: Secondary | ICD-10-CM | POA: Diagnosis not present

## 2021-01-10 DIAGNOSIS — I5023 Acute on chronic systolic (congestive) heart failure: Secondary | ICD-10-CM | POA: Diagnosis present

## 2021-01-10 HISTORY — PX: RIGHT/LEFT HEART CATH AND CORONARY ANGIOGRAPHY: CATH118266

## 2021-01-10 SURGERY — RIGHT/LEFT HEART CATH AND CORONARY ANGIOGRAPHY
Anesthesia: Moderate Sedation | Laterality: Bilateral

## 2021-01-10 MED ORDER — ACETAMINOPHEN 325 MG PO TABS: 650.0000 mg | ORAL_TABLET | ORAL | Status: DC | PRN

## 2021-01-10 MED ORDER — SODIUM CHLORIDE 0.9 % IV SOLN: 250.0000 mL | INTRAVENOUS | Status: DC | PRN

## 2021-01-10 MED ORDER — LIDOCAINE HCL (PF) 1 % IJ SOLN
INTRAMUSCULAR | Status: DC | PRN
  Administered 2021-01-10: 4 mL

## 2021-01-10 MED ORDER — VERAPAMIL HCL 2.5 MG/ML IV SOLN
INTRAVENOUS | Status: DC | PRN
  Administered 2021-01-10: 2.5 mg via INTRAVENOUS

## 2021-01-10 MED ORDER — FENTANYL CITRATE (PF) 100 MCG/2ML IJ SOLN
INTRAMUSCULAR | Status: DC | PRN
  Administered 2021-01-10: 25 ug via INTRAVENOUS

## 2021-01-10 MED ORDER — HYDRALAZINE HCL 20 MG/ML IJ SOLN: 10.0000 mg | INTRAMUSCULAR | Status: DC | PRN

## 2021-01-10 MED ORDER — IOHEXOL 300 MG/ML  SOLN
INTRAMUSCULAR | Status: DC | PRN
  Administered 2021-01-10: 45 mL

## 2021-01-10 MED ORDER — SODIUM CHLORIDE 0.9% FLUSH: 3.0000 mL | Freq: Two times a day (BID) | INTRAVENOUS | Status: DC

## 2021-01-10 MED ORDER — SODIUM CHLORIDE 0.9 % IV SOLN: INTRAVENOUS | Status: DC

## 2021-01-10 MED ORDER — VERAPAMIL HCL 2.5 MG/ML IV SOLN
INTRAVENOUS | Status: AC
  Filled 2021-01-10: qty 2

## 2021-01-10 MED ORDER — LIDOCAINE HCL (PF) 1 % IJ SOLN
INTRAMUSCULAR | Status: AC
  Filled 2021-01-10: qty 30

## 2021-01-10 MED ORDER — SODIUM CHLORIDE 0.9% FLUSH: 3.0000 mL | INTRAVENOUS | Status: DC | PRN

## 2021-01-10 MED ORDER — HEPARIN SODIUM (PORCINE) 1000 UNIT/ML IJ SOLN
INTRAMUSCULAR | Status: AC
  Filled 2021-01-10: qty 1

## 2021-01-10 MED ORDER — HEPARIN (PORCINE) IN NACL 1000-0.9 UT/500ML-% IV SOLN
INTRAVENOUS | Status: AC
  Filled 2021-01-10: qty 1000

## 2021-01-10 MED ORDER — HEPARIN (PORCINE) IN NACL 2000-0.9 UNIT/L-% IV SOLN
INTRAVENOUS | Status: DC | PRN
  Administered 2021-01-10: 4500 mL

## 2021-01-10 MED ORDER — MIDAZOLAM HCL 2 MG/2ML IJ SOLN
INTRAMUSCULAR | Status: AC
  Filled 2021-01-10: qty 2

## 2021-01-10 MED ORDER — MIDAZOLAM HCL 2 MG/2ML IJ SOLN
INTRAMUSCULAR | Status: DC | PRN
  Administered 2021-01-10: 1 mg via INTRAVENOUS

## 2021-01-10 MED ORDER — HEPARIN (PORCINE) IN NACL 1000-0.9 UT/500ML-% IV SOLN
INTRAVENOUS | Status: DC | PRN
  Administered 2021-01-10: 500 mL

## 2021-01-10 MED ORDER — ONDANSETRON HCL 4 MG/2ML IJ SOLN: 4.0000 mg | Freq: Four times a day (QID) | INTRAMUSCULAR | Status: DC | PRN

## 2021-01-10 MED ORDER — FUROSEMIDE 40 MG PO TABS: 40.0000 mg | ORAL_TABLET | Freq: Every day | ORAL | 1 refills | Status: DC

## 2021-01-10 MED ORDER — FENTANYL CITRATE (PF) 100 MCG/2ML IJ SOLN
INTRAMUSCULAR | Status: AC
  Filled 2021-01-10: qty 2

## 2021-01-10 SURGICAL SUPPLY — 11 items
CATH 5F 110X4 TIG (CATHETERS) ×2 IMPLANT
CATH BALLN WEDGE 5F 110CM (CATHETERS) ×2 IMPLANT
DEVICE RAD TR BAND REGULAR (VASCULAR PRODUCTS) ×2 IMPLANT
GLIDESHEATH SLEND SS 6F .021 (SHEATH) ×2 IMPLANT
GUIDEWIRE .025 260CM (WIRE) ×2 IMPLANT
GUIDEWIRE INQWIRE 1.5J.035X260 (WIRE) ×1 IMPLANT
INQWIRE 1.5J .035X260CM (WIRE) ×2
KIT MANI 3VAL PERCEP (MISCELLANEOUS) ×2 IMPLANT
PACK CARDIAC CATH (CUSTOM PROCEDURE TRAY) ×2 IMPLANT
SHEATH GLIDE SLENDER 4/5FR (SHEATH) ×2 IMPLANT
WIRE HITORQ VERSACORE ST 145CM (WIRE) ×2 IMPLANT

## 2021-01-10 NOTE — Interval H&P Note (Signed)
History and Physical Interval Note:  01/10/2021 8:14 AM  Gabriel Martin  has presented today for surgery, with the diagnosis of acute on chronic HFrEF.  The various methods of treatment have been discussed with the patient and family. After consideration of risks, benefits and other options for treatment, the patient has consented to  Procedure(s): RIGHT/LEFT HEART CATH AND CORONARY ANGIOGRAPHY (Bilateral) as a surgical intervention.  The patient's history has been reviewed, patient examined, no change in status, stable for surgery.  I have reviewed the patient's chart and labs.  Questions were answered to the patient's satisfaction.    Cath Lab Visit (complete for each Cath Lab visit)  Clinical Evaluation Leading to the Procedure:   ACS: No.  Non-ACS:    Anginal/Heart Failure Classification: NYHA class III  Anti-ischemic medical therapy: Minimal Therapy (1 class of medications)  Non-Invasive Test Results: No non-invasive testing performed (LVEF 30-35% by echo -> high risk)  Prior CABG: No previous CABG  Gabriel Martin

## 2021-01-10 NOTE — Telephone Encounter (Signed)
-----   Message from Debbe Odea, MD sent at 01/10/2021 10:52 AM EST ----- Nonobstructive CAD.  Stop losartan, start Entresto after 48 hours.  Keep follow-up appointment with myself.

## 2021-01-10 NOTE — Discharge Instructions (Signed)
Heart Failure Action Plan A heart failure action plan helps you understand what to do when you have symptoms of heart failure. Your action plan is a color-coded plan that lists the symptoms to watch for and indicates what actions to take.  If you have symptoms in the red zone, you need medical care right away.  If you have symptoms in the yellow zone, you are having problems.  If you have symptoms in the green zone, you are doing well. Follow the plan that was created by you and your health care provider. Review your plan each time you visit your health care provider. Red zone These signs and symptoms mean you should get medical help right away:  You have trouble breathing when resting.  You have a dry cough that is getting worse.  You have swelling or pain in your legs or abdomen that is getting worse.  You suddenly gain more than 2-3 lb (0.9-1.4 kg) in 24 hours, or more than 5 lb (2.3 kg) in a week. This amount may be more or less depending on your condition.  You have trouble staying awake or you feel confused.  You have chest pain.  You do not have an appetite.  You pass out.  You have worsening sadness or depression. If you have any of these symptoms, call your local emergency services (911 in the [place] right away. Do not drive yourself to the hospital.   Yellow zone These signs and symptoms mean your condition may be getting worse and you should make some changes:  You have trouble breathing when you are active, or you need to sleep with your head raised on extra pillows to help you breathe.  You have swelling in your legs or abdomen.  You gain 2-3 lb (0.9-1.4 kg) in 24 hours, or 5 lb (2.3 kg) in a week. This amount may be more or less depending on your condition.  You get tired easily.  You have trouble sleeping.  You have a dry cough. If you have any of these symptoms:  Contact your health care provider within the next day.  Your health care provider may adjust  your medicines.   Green zone These signs mean you are doing well and can continue what you are doing:  You do not have shortness of breath.  You have very little swelling or no new swelling.  Your weight is stable (no gain or loss).  You have a normal activity level.  You do not have chest pain or any other new symptoms.   Follow these instructions at home:  Take over-the-counter and prescription medicines only as told by your health care provider.  Weigh yourself daily. Your target weight is __________ lb (__________ kg). ? Call your health care provider if you gain more than __________ lb (__________ kg) in 24 hours, or more than __________ lb (__________ kg) in a week. ? Health care provider name: _____________________________________________________ ? Health care provider phone number: _____________________________________________________  Eat a heart-healthy diet. Work with a diet and nutrition specialist (dietitian) to create an eating plan that is best for you.  Keep all follow-up visits. This is important. Where to find more information  American Heart Association: www.heart.org Summary  A heart failure action plan helps you understand what to do when you have symptoms of heart failure.  Follow the action plan that was created by you and your health care provider.  Get help right away if you have any symptoms in the red zone.  This information is not intended to replace advice given to you by your health care provider. Make sure you discuss any questions you have with your health care provider. Document Revised: 06/27/2020 Document Reviewed: 06/27/2020 Elsevier Patient Education  2021 [place]  This sheet gives you information about how to care for yourself after your procedure. Your health care provider may also give you more specific instructions. If you have problems or questions, contact your health care provider. What can I expect after the  procedure? After the procedure, it is common to have:  Bruising and tenderness at the catheter insertion area. Follow these instructions at home: Medicines  Take over-the-counter and prescription medicines only as told by your health care provider. Insertion site care  Follow instructions from your health care provider about how to take care of your insertion site. Make sure you: ? Wash your hands with soap and water before you change your bandage (dressing). If soap and water are not available, use hand sanitizer. ? Change your dressing as told by your health care provider. ? Leave stitches (sutures), skin glue, or adhesive strips in place. These skin closures may need to stay in place for 2 weeks or longer. If adhesive strip edges start to loosen and curl up, you may trim the loose edges. Do not remove adhesive strips completely unless your health care provider tells you to do that.  Check your insertion site every day for signs of infection. Check for: ? Redness, swelling, or pain. ? Fluid or blood. ? Pus or a bad smell. ? Warmth.  Do not take baths, swim, or use a hot tub until your health care provider approves.  You may shower 24-48 hours after the procedure, or as directed by your health care provider. ? Remove the dressing and gently wash the site with plain soap and water. ? Pat the area dry with a clean towel. ? Do not rub the site. That could cause bleeding.  Do not apply powder or lotion to the site. Activity  For 24 hours after the procedure, or as directed by your health care provider: ? Do not flex or bend the affected arm. ? Do not push or pull heavy objects with the affected arm. ? Do not drive yourself home from the hospital or clinic. You may drive 24 hours after the procedure unless your health care provider tells you not to. ? Do not operate machinery or power tools.  Do not lift anything that is heavier than 10 lb (4.5 kg), or the limit that you are told,  until your health care provider says that it is safe.  Ask your health care provider when it is okay to: ? Return to work or school. ? Resume usual physical activities or sports. ? Resume sexual activity.   General instructions  If the catheter site starts to bleed, raise your arm and put firm pressure on the site. If the bleeding does not stop, get help right away. This is a medical emergency.  If you went home on the same day as your procedure, a responsible adult should be with you for the first 24 hours after you arrive home.  Keep all follow-up visits as told by your health care provider. This is important. Contact a health care provider if:  You have a fever.  You have redness, swelling, or yellow drainage around your insertion site. Get help right away if:  You have unusual pain at the radial site.  The  catheter insertion area swells very fast.  The insertion area is bleeding, and the bleeding does not stop when you hold steady pressure on the area.  Your arm or hand becomes pale, cool, tingly, or numb. These symptoms may represent a serious problem that is an emergency. Do not wait to see if the symptoms will go away. Get medical help right away. Call your local emergency services (911 in the [place] Do not drive yourself to the hospital. Summary  After the procedure, it is common to have bruising and tenderness at the site.  Follow instructions from your health care provider about how to take care of your radial site wound. Check the wound every day for signs of infection.  Do not lift anything that is heavier than 10 lb (4.5 kg), or the limit that you are told, until your health care provider says that it is safe. This information is not intended to replace advice given to you by your health care provider. Make sure you discuss any questions you have with your health care provider. Document Revised: 12/18/2017 Document Reviewed: 12/18/2017 Elsevier Patient Education   2021 [employer].

## 2021-01-11 ENCOUNTER — Telehealth: Payer: Self-pay

## 2021-01-11 MED ORDER — ENTRESTO 24-26 MG PO TABS: 1.0000 | ORAL_TABLET | Freq: Two times a day (BID) | ORAL | 3 refills | Status: DC

## 2021-01-11 NOTE — Telephone Encounter (Signed)
Clarified the below instructions with Dr. Garen Lah. Patient recommended to Stop Losartan and Start Entresto 24-26 MG  the next  Day (no need for a 48 hour hold).    Called and spoke with patient to give the recommendations. Patient repeated back and verbalized understanding. Prescription sent in.

## 2021-01-11 NOTE — Telephone Encounter (Signed)
Prior Authorization initiated by [facility] in [email or web address] for Entresto 24-26mg  tablets. PA questions answered.  KEY: [id number]  Response: [employer] has received your information, and the request will be reviewed. You may close this dialog, return to your dashboard, and perform other tasks.  You will receive an electronic determination in CoverMyMeds. You can see the latest determination by locating this request on your dashboard or by reopening this request. You will also receive a faxed copy of the determination. If you have any questions please contact [employer] at [phone].  If you need assistance, please chat with CoverMyMeds or call us at [phone].

## 2021-01-12 NOTE — Telephone Encounter (Signed)
Pt has been approved for Entresto 24-26 mg.  Approvedon February 16 [place] Case: [id number], Status: Approved, Coverage Starts on: 01/11/2021 12:00:00 AM, Coverage Ends on: 01/11/2022 12:00:00 AM.

## 2021-01-26 ENCOUNTER — Encounter: Payer: Self-pay | Admitting: Cardiology

## 2021-01-26 ENCOUNTER — Other Ambulatory Visit: Payer: Self-pay

## 2021-01-26 ENCOUNTER — Ambulatory Visit (INDEPENDENT_AMBULATORY_CARE_PROVIDER_SITE_OTHER): Payer: PPO | Admitting: Cardiology

## 2021-01-26 VITALS — BP 100/60 | HR 74 | Ht 66.0 in | Wt 209.1 lb

## 2021-01-26 DIAGNOSIS — I428 Other cardiomyopathies: Secondary | ICD-10-CM | POA: Diagnosis not present

## 2021-01-26 DIAGNOSIS — I639 Cerebral infarction, unspecified: Secondary | ICD-10-CM

## 2021-01-26 NOTE — Patient Instructions (Signed)
Medication Instructions:  Your physician recommends that you continue on your current medications as directed. Please refer to the Current Medication list given to you today.  *If you need a refill on your cardiac medications before your next appointment, please call your pharmacy*   Lab Work: None ordered If you have labs (blood work) drawn today and your tests are completely normal, you will receive your results only by: MyChart Message (if you have MyChart) OR A paper copy in the mail If you have any lab test that is abnormal or we need to change your treatment, we will call you to review the results.   Testing/Procedures: None ordered   Follow-Up: At [facility], you and your health needs are our priority.  As part of our continuing mission to provide you with exceptional heart care, we have created designated Provider Care Teams.  These Care Teams include your primary Cardiologist (physician) and Advanced Practice Providers (APPs -  Physician Assistants and Nurse Practitioners) who all work together to provide you with the care you need, when you need it.  We recommend signing up for the patient portal called "MyChart".  Sign up information is provided on this After Visit Summary.  MyChart is used to connect with patients for Virtual Visits (Telemedicine).  Patients are able to view lab/test results, encounter notes, upcoming appointments, etc.  Non-urgent messages can be sent to your provider as well.   To learn more about what you can do with MyChart, go to [email or web address].    Your next appointment:   3 month(s)  The format for your next appointment:   In Person  Provider:   Brian Agbor-Etang, MD   Other Instructions   

## 2021-01-26 NOTE — Progress Notes (Signed)
Cardiology Office Note:    Date:  01/26/2021   ID:  Gabriel Martin, DOB June 22, 1941, MRN [id number]  PCP:  [name], MD  [place] Cardiologist:  No primary care provider on file.  [facility] Electrophysiologist:  None   Referring MD: [name], MD   Chief Complaint  Patient presents with  . Other    S/P LT/RT heart cath no complaints today. Meds reviewed verbally with pt.     History of Present Illness:    Gabriel Martin is a 80 y.o. male with a hx of hypertension, HFrEF, EF 30 to 35%, CVA 10/2019 who presents for follow-up.    Last seen due to syncope, recent CVA and HFrEF.  Right and left heart cath was ordered to evaluate presence of CAD/ischemia.  Left heart cath performed showing mild nonobstructive CAD.  Started on Lasix due to elevated filling pressures.  States his edema is much improved since starting Lasix.  Has no concerns at this time, has shortness of breath when he overexerts himself.   Prior notes  Patient presented to the ED on 12/8 due to confusion, lightheadedness and loss consciousness, falling to the ground and hitting head.  In the ED, work-up with an MRI showed 2 acute left MCA territory cortical infarcts.  He was started on aspirin, Plavix, Lipitor.  Echocardiogram showed moderate to severely reduced ejection fraction, EF 30 to 35%, global hypokinesis.    Left heart cath 01/10/2021 mild nonobstructive CAD.  Past Medical History:  Diagnosis Date  . Acid reflux 05/12/2015  . Anemia   . Arthritis    finger thumb  . Benign enlargement of prostate   . Benign neoplasm of large bowel   . Benign prostatic hyperplasia with urinary obstruction 05/12/2015  . BPH (benign prostatic hyperplasia)   . BPH with obstruction/lower urinary tract symptoms 05/12/2015  . Change in blood platelet count 05/12/2015  . CHF (congestive heart failure) ([place])   . Chronic kidney disease 05/12/2015  . GERD (gastroesophageal reflux disease)   . HLD  (hyperlipidemia) 05/12/2015  . Hyperlipidemia   . Nocturia associated with benign prostatic hypertrophy   . Penile ulcer 05/12/2015  . Skin cancer   . Stroke ([place])   . Thrombocytopenia ([place])     Past Surgical History:  Procedure Laterality Date  . CATARACT EXTRACTION W/PHACO Left 02/29/2016   Procedure: CATARACT EXTRACTION PHACO AND INTRAOCULAR LENS PLACEMENT ([place]) left;  Surgeon: Leandrew Koyanagi, MD;  Location: [place];  Service: Ophthalmology;  Laterality: Left;  . CATARACT EXTRACTION W/PHACO Right 04/25/2016   Procedure: CATARACT EXTRACTION PHACO AND INTRAOCULAR LENS PLACEMENT (IOC);  Surgeon: Leandrew Koyanagi, MD;  Location: [place] [place];  Service: Ophthalmology;  Laterality: Right;  . COLONOSCOPY WITH PROPOFOL N/A 07/01/2017   Procedure: COLONOSCOPY WITH PROPOFOL;  Surgeon: Manya Silvas, MD;  Location: [facility] ENDOSCOPY;  Service: Endoscopy;  Laterality: N/A;  . EYE SURGERY    . HEMORRHOID SURGERY    . RIGHT/LEFT HEART CATH AND CORONARY ANGIOGRAPHY Bilateral 01/10/2021   Procedure: RIGHT/LEFT HEART CATH AND CORONARY ANGIOGRAPHY;  Surgeon: Nelva Bush, MD;  Location: [place] CV LAB;  Service: Cardiovascular;  Laterality: Bilateral;  . TEE WITHOUT CARDIOVERSION N/A 11/30/2020   Procedure: TRANSESOPHAGEAL ECHOCARDIOGRAM (TEE);  Surgeon: Kate Sable, MD;  Location: [facility] ORS;  Service: Cardiovascular;  Laterality: N/A;    Current Medications: Current Meds  Medication Sig  . acetaminophen (TYLENOL) 500 MG tablet Take 500 mg by mouth every 6 (six) hours as needed.  Marland Kitchen  aspirin EC 81 MG EC tablet Take 1 tablet (81 mg total) by mouth daily. Swallow whole.  Marland Kitchen atorvastatin (LIPITOR) 40 MG tablet Take 1 tablet (40 mg total) by mouth daily at 6 PM.  . famotidine (PEPCID) 20 MG tablet Take 20 mg by mouth daily.  . furosemide (LASIX) 40 MG tablet Take 1 tablet (40 mg total) by mouth daily.  Marland Kitchen gabapentin (NEURONTIN) 100 MG capsule Take 100 mg by mouth daily.   . metoprolol succinate (TOPROL-XL) 25 MG 24 hr tablet Take 0.5 tablets (12.5 mg total) by mouth daily.  . sacubitril-valsartan (ENTRESTO) 24-26 MG Take 1 tablet by mouth 2 (two) times daily.     Allergies:   Patient has no known allergies.   Social History   Socioeconomic History  . Marital status: Married    Spouse name: Not on file  . Number of children: Not on file  . Years of education: Not on file  . Highest education level: Not on file  Occupational History  . Not on file  Tobacco Use  . Smoking status: Former Smoker    Types: Cigarettes    Quit date: 11/26/1978    Years since quitting: 42.1  . Smokeless tobacco: Never Used  . Tobacco comment: quit 40 years ago  Vaping Use  . Vaping Use: Never used  Substance and Sexual Activity  . Alcohol use: Yes    Alcohol/week: 6.0 standard drinks    Types: 6 Cans of beer per week    Comment: occasional  . Drug use: No  . Sexual activity: Never  Other Topics Concern  . Not on file  Social History Narrative  . Not on file   Social Determinants of Health   Financial Resource Strain: Not on file  Food Insecurity: Not on file  Transportation Needs: Not on file  Physical Activity: Not on file  Stress: Not on file  Social Connections: Not on file     Family History: The patient's family history includes Heart attack in his mother; Heart attack (age of onset: 8) in his nephew; Heart attack (age of onset: 34) in his brother; Heart disease in his brother.  ROS:   Please see the history of present illness.     All other systems reviewed and are negative.  EKGs/Labs/Other Studies Reviewed:    The following studies were reviewed today:   EKG:  EKG is  ordered today.  The ekg ordered today demonstrates sinus rhythm, PVCs.  Recent Labs: 11/03/2020: ALT 24; Magnesium 2.2 11/04/2020: B Natriuretic Peptide 710.8 01/05/2021: BUN 25; Creatinine, Ser 1.29; Hemoglobin 13.8; Platelets 102; Potassium 4.3; Sodium 141  Recent Lipid  Panel    Component Value Date/Time   CHOL 137 11/04/2020 0526   TRIG 68 11/04/2020 0526   HDL 48 11/04/2020 0526   CHOLHDL 2.9 11/04/2020 0526   VLDL 14 11/04/2020 0526   LDLCALC 75 11/04/2020 0526     Risk Assessment/Calculations:      Physical Exam:    VS:  BP 100/60 (BP Location: Left Arm, Patient Position: Sitting, Cuff Size: Normal)   Pulse 74   Ht 5\' 6"  (1.676 m)   Wt 209 lb 2 oz (94.9 kg)   SpO2 97%   BMI 33.75 kg/m     Wt Readings from Last 3 Encounters:  01/26/21 209 lb 2 oz (94.9 kg)  01/05/21 209 lb 2 oz (94.9 kg)  11/30/20 200 lb (90.7 kg)     GEN:  Well nourished, well developed in no  acute distress HEENT: Normal NECK: No JVD; No carotid bruits LYMPHATICS: No lymphadenopathy CARDIAC: RRR, no murmurs, rubs, gallops RESPIRATORY:  Clear to auscultation without rales, wheezing or rhonchi  ABDOMEN: Soft, non-tender, non-distended MUSCULOSKELETAL:  No edema; No deformity  SKIN: Warm and dry NEUROLOGIC:  Alert and oriented x 3 PSYCHIATRIC:  Normal affect   ASSESSMENT:    1. Cerebrovascular accident (CVA), unspecified mechanism ([place])   2. NICM (nonischemic cardiomyopathy) ([place])    PLAN:    In order of problems listed above:  1. Recent stroke, 2 left MCA cortical territory.  Cardiac monitor with no evidence for A. fib or flutter.  TEE no thrombus or intra-atrial shunt.  On aspirin, Lipitor. 2. Nonischemic cardiomyopathy, EF 30 to 35%.  Left heart cath with mild, non obstructive CAD.  NYHA class II symptoms.  Continue Toprol-XL 12.5 mg daily, Entresto 24-26 mg daily, Lasix 40 mg daily.  Refer to advanced heart failure clinic.  Low blood pressures preventing addition or titration of CHF meds at this time  Follow-up in 3 months  Total encounter time more than 40 minutes  Greater than 50% was spent in counseling and coordination of care with the patient    Medication Adjustments/Labs and Tests Ordered: Current medicines are reviewed at length with the  patient today.  Concerns regarding medicines are outlined above.  Orders Placed This Encounter  Procedures  . AMB referral to CHF clinic  . EKG 12-Lead   No orders of the defined types were placed in this encounter.   Patient Instructions  Medication Instructions:  Your physician recommends that you continue on your current medications as directed. Please refer to the Current Medication list given to you today.  *If you need a refill on your cardiac medications before your next appointment, please call your pharmacy*   Lab Work: None ordered If you have labs (blood work) drawn today and your tests are completely normal, you will receive your results only by: Marland Kitchen MyChart Message (if you have MyChart) OR . A paper copy in the mail If you have any lab test that is abnormal or we need to change your treatment, we will call you to review the results.   Testing/Procedures: None ordered   Follow-Up: At [facility], you and your health needs are our priority.  As part of our continuing mission to provide you with exceptional heart care, we have created designated Provider Care Teams.  These Care Teams include your primary Cardiologist (physician) and Advanced Practice Providers (APPs -  Physician Assistants and Nurse Practitioners) who all work together to provide you with the care you need, when you need it.  We recommend signing up for the patient portal called "MyChart".  Sign up information is provided on this After Visit Summary.  MyChart is used to connect with patients for Virtual Visits (Telemedicine).  Patients are able to view lab/test results, encounter notes, upcoming appointments, etc.  Non-urgent messages can be sent to your provider as well.   To learn more about what you can do with MyChart, go to [email or web address].    Your next appointment:   3 month(s)  The format for your next appointment:   In Person  Provider:   Kate Sable, MD   Other  Instructions      Signed, Kate Sable, MD  01/26/2021 11:19 AM    [place]

## 2021-01-30 DIAGNOSIS — N401 Enlarged prostate with lower urinary tract symptoms: Secondary | ICD-10-CM | POA: Diagnosis not present

## 2021-01-30 DIAGNOSIS — K219 Gastro-esophageal reflux disease without esophagitis: Secondary | ICD-10-CM | POA: Diagnosis not present

## 2021-01-30 DIAGNOSIS — Z6834 Body mass index (BMI) 34.0-34.9, adult: Secondary | ICD-10-CM | POA: Diagnosis not present

## 2021-01-30 DIAGNOSIS — D696 Thrombocytopenia, unspecified: Secondary | ICD-10-CM | POA: Diagnosis not present

## 2021-01-30 DIAGNOSIS — N1831 Chronic kidney disease, stage 3a: Secondary | ICD-10-CM | POA: Diagnosis not present

## 2021-01-30 DIAGNOSIS — Z8739 Personal history of other diseases of the musculoskeletal system and connective tissue: Secondary | ICD-10-CM | POA: Diagnosis not present

## 2021-01-30 DIAGNOSIS — E7800 Pure hypercholesterolemia, unspecified: Secondary | ICD-10-CM | POA: Diagnosis not present

## 2021-01-30 DIAGNOSIS — Z0000 Encounter for general adult medical examination without abnormal findings: Secondary | ICD-10-CM | POA: Diagnosis not present

## 2021-01-30 NOTE — Progress Notes (Signed)
Patient ID: Gabriel Martin, male    DOB: 02/22/1941, 80 y.o.   MRN: [id number]  HPI  Gabriel Martin is a 80 y/o male with a history of hyperlipidemia, CKD, stroke, anemia, GERD, BPH, previous tobacco use and chronic heart failure.   TEE from 11/30/20 reviewed and showed an EF of 30-35% along with mild/moderate Gabriel without atrial thrombus. Echo report from 11/04/20 reviewed and showed an EF of 30-35% along with moderate elevated PA pressure of 45.8 mmHg.   RHC/LHC done 01/10/21 and showed: 1. Mild, non-obstructive coronary artery disease consistent with non-ischemic cardiomyopathy. 2. Mildly to moderately elevated left heart and pulmonary artery pressures. 3. Moderately elevated right heart filling pressures. 4. Mildly reduced Fick cardiac output/index.  Admitted 11/02/20 due to acute embolic CVA and cardiomegaly. Presented after syncopal event. Neurology consult obtained. MRI of the brain showed 2 acute left MCA territory cortical infarcts with 1 measuring 15 mm within the left parietal lobe and the other within the left frontal lobe motor strip. Carotid ultrasound showed minimal plaque. PT/OT consults obtained. IV lasix given with transition to oral diuretics. Discharged after 2 days.   Gabriel Martin presents today for a follow-up visit with a chief complaint of minimal shortness of breath upon moderate exertion. Gabriel Martin describes this as chronic in nature having been present for several months. Gabriel Martin does feel like Gabriel Martin breathing has improved. Gabriel Martin has associated fatigue, dizziness and easy bruising along with this. Gabriel Martin denies any difficulty sleeping, abdominal distention, palpitations, pedal edema, chest pain, cough or weight gain.   Says that Gabriel Martin's currently waiting to hear from the [facility] about an appointment.   Past Medical History:  Diagnosis Date  . Acid reflux 05/12/2015  . Anemia   . Arthritis    finger thumb  . Benign enlargement of prostate   . Benign neoplasm of large bowel   . Benign  prostatic hyperplasia with urinary obstruction 05/12/2015  . BPH (benign prostatic hyperplasia)   . BPH with obstruction/lower urinary tract symptoms 05/12/2015  . Change in blood platelet count 05/12/2015  . CHF (congestive heart failure) ([place])   . Chronic kidney disease 05/12/2015  . GERD (gastroesophageal reflux disease)   . HLD (hyperlipidemia) 05/12/2015  . Hyperlipidemia   . Nocturia associated with benign prostatic hypertrophy   . Penile ulcer 05/12/2015  . Skin cancer   . Stroke ([place])   . Thrombocytopenia ([place])    Past Surgical History:  Procedure Laterality Date  . CATARACT EXTRACTION W/PHACO Left 02/29/2016   Procedure: CATARACT EXTRACTION PHACO AND INTRAOCULAR LENS PLACEMENT ([place]) left;  Surgeon: Leandrew Koyanagi, MD;  Location: [place];  Service: Ophthalmology;  Laterality: Left;  . CATARACT EXTRACTION W/PHACO Right 04/25/2016   Procedure: CATARACT EXTRACTION PHACO AND INTRAOCULAR LENS PLACEMENT (IOC);  Surgeon: Leandrew Koyanagi, MD;  Location: [place];  Service: Ophthalmology;  Laterality: Right;  . COLONOSCOPY WITH PROPOFOL N/A 07/01/2017   Procedure: COLONOSCOPY WITH PROPOFOL;  Surgeon: Manya Silvas, MD;  Location: [facility] ENDOSCOPY;  Service: Endoscopy;  Laterality: N/A;  . EYE SURGERY    . HEMORRHOID SURGERY    . RIGHT/LEFT HEART CATH AND CORONARY ANGIOGRAPHY Bilateral 01/10/2021   Procedure: RIGHT/LEFT HEART CATH AND CORONARY ANGIOGRAPHY;  Surgeon: Nelva Bush, MD;  Location: [place] CV LAB;  Service: Cardiovascular;  Laterality: Bilateral;  . TEE WITHOUT CARDIOVERSION N/A 11/30/2020   Procedure: TRANSESOPHAGEAL ECHOCARDIOGRAM (TEE);  Surgeon: Kate Sable, MD;  Location: [facility] ORS;  Service: Cardiovascular;  Laterality: N/A;   Family History  Problem Relation Age of Onset  . Heart disease Brother   . Heart attack Brother 78  . Heart attack Mother   . Heart attack Nephew 54   Social History   Tobacco Use  . Smoking status:  Former Smoker    Types: Cigarettes    Quit date: 11/26/1978    Years since quitting: 42.2  . Smokeless tobacco: Never Used  . Tobacco comment: quit 40 years ago  Substance Use Topics  . Alcohol use: Yes    Alcohol/week: 6.0 standard drinks    Types: 6 Cans of beer per week    Comment: occasional   No Known Allergies  Prior to Admission medications   Medication Sig Start Date End Date Taking? Authorizing Provider  acetaminophen (TYLENOL) 500 MG tablet Take 500 mg by mouth every 6 (six) hours as needed.   Yes [provider]  aspirin EC 81 MG EC tablet Take 1 tablet (81 mg total) by mouth daily. Swallow whole. 11/05/20  Yes Loletha Grayer, MD  atorvastatin (LIPITOR) 40 MG tablet Take 1 tablet (40 mg total) by mouth daily at 6 PM. 11/04/20  Yes Wieting, Richard, MD  famotidine (PEPCID) 20 MG tablet Take 20 mg by mouth daily.   Yes [provider]  furosemide (LASIX) 40 MG tablet Take 1 tablet (40 mg total) by mouth daily. 01/11/21  Yes End, Harrell Gave, MD  gabapentin (NEURONTIN) 100 MG capsule Take 100 mg by mouth daily. 09/08/20  Yes [provider]  metoprolol succinate (TOPROL-XL) 25 MG 24 hr tablet Take 0.5 tablets (12.5 mg total) by mouth daily. 11/05/20  Yes Wieting, Richard, MD  sacubitril-valsartan (ENTRESTO) 24-26 MG Take 1 tablet by mouth 2 (two) times daily. 01/11/21  Yes Kate Sable, MD    Review of Systems  Constitutional: Positive for fatigue ("legs feel tired at times"). Negative for appetite change.  HENT: Negative for congestion, postnasal drip and sore throat.   Eyes: Negative.   Respiratory: Positive for shortness of breath (at times). Negative for cough.   Cardiovascular: Negative for chest pain, palpitations and leg swelling.  Gastrointestinal: Negative for abdominal distention and abdominal pain.  Endocrine: Negative.   Genitourinary: Negative.   Musculoskeletal: Negative for back pain and neck pain.  Skin: Negative.    Allergic/Immunologic: Negative.   Neurological: Positive for dizziness. Negative for light-headedness.  Hematological: Negative for adenopathy. Bruises/bleeds easily.  Psychiatric/Behavioral: Negative for dysphoric mood and sleep disturbance (sleeping on 1 pillow). The patient is not nervous/anxious.    Vitals:   01/31/21 1050  BP: (!) 127/53  Pulse: 71  Resp: 18  SpO2: 97%  Weight: 207 lb 6 oz (94.1 kg)  Height: 5\' 6"  (1.676 m)   Wt Readings from Last 3 Encounters:  01/31/21 207 lb 6 oz (94.1 kg)  01/26/21 209 lb 2 oz (94.9 kg)  01/05/21 209 lb 2 oz (94.9 kg)   Lab Results  Component Value Date   CREATININE 1.29 (H) 01/05/2021   CREATININE 1.94 (H) 11/24/2020   CREATININE 1.19 11/04/2020    Physical Exam Vitals and nursing note reviewed.  Constitutional:      Appearance: Gabriel Martin is well-developed.  HENT:     Head: Normocephalic and atraumatic.  Neck:     Vascular: No JVD.  Cardiovascular:     Rate and Rhythm: Normal rate and regular rhythm.  Pulmonary:     Effort: Pulmonary effort is normal. No respiratory distress.     Breath sounds: No wheezing or rales.  Abdominal:  Palpations: Abdomen is soft.     Tenderness: There is no abdominal tenderness.  Musculoskeletal:     Cervical back: Neck supple.     Right lower leg: No tenderness. No edema.     Left lower leg: No tenderness. No edema.  Skin:    General: Skin is warm and dry.  Neurological:     General: No focal deficit present.     Mental Status: Gabriel Martin is alert and oriented to person, place, and time.  Psychiatric:        Mood and Affect: Mood normal.        Behavior: Behavior normal.     Assessment & Plan:  1: Chronic heart failure with reduced ejection fraction- - NYHA class II - euvolemic today - weighing daily; instructed to call for an overnight weight gain of > 2 pounds or a weekly weight gain of > 5 pounds - weight up 2 pounds from last visit here 2 months ago - not adding salt and has been recently  reading food labels for sodium content; reviewed the importance of closely following a 2000mg  sodium diet  - was able to walk to the office from the [facility] entrance today - saw cardiology ([place]) 01/26/21; returns 05/04/21 - referral had been made to the [facility] in [place]; patient waiting to hear from them about an appointment - currently on entresto; BP has previously been low limiting GDMT - BNP 11/04/20 was 710.8 - has received Gabriel Martin flu vaccine for this season - has received all 3 covid vaccines  2: CKD- - saw PCP (Hand[place]) 11/08/20 - BMP 01/05/21 reviewed and showed sodium 141, potassium 4.3, creatinine 1.29 and GFR 52  3: Embolic stroke- - would not want BP to be too low due to recent stroke   Patient did not bring Gabriel Martin medications nor a list. Each medication was verbally reviewed with the patient and Gabriel Martin was encouraged to bring the bottles to every visit to confirm accuracy of list.  Due to patient getting referred to the [facility], will not make a return appointment here for patient. Advised patient that once Gabriel Martin finishes in [place], Gabriel Martin can call us back if Gabriel Martin'd like to make another appointment. Patient was comfortable with this plan.

## 2021-01-31 ENCOUNTER — Encounter: Payer: Self-pay | Admitting: Family

## 2021-01-31 ENCOUNTER — Ambulatory Visit: Payer: PPO | Attending: Family | Admitting: Family

## 2021-01-31 ENCOUNTER — Other Ambulatory Visit: Payer: Self-pay

## 2021-01-31 VITALS — BP 127/53 | HR 71 | Resp 18 | Ht 66.0 in | Wt 207.4 lb

## 2021-01-31 DIAGNOSIS — Z8673 Personal history of transient ischemic attack (TIA), and cerebral infarction without residual deficits: Secondary | ICD-10-CM | POA: Diagnosis not present

## 2021-01-31 DIAGNOSIS — Z79899 Other long term (current) drug therapy: Secondary | ICD-10-CM | POA: Diagnosis not present

## 2021-01-31 DIAGNOSIS — N400 Enlarged prostate without lower urinary tract symptoms: Secondary | ICD-10-CM | POA: Insufficient documentation

## 2021-01-31 DIAGNOSIS — N182 Chronic kidney disease, stage 2 (mild): Secondary | ICD-10-CM

## 2021-01-31 DIAGNOSIS — I2510 Atherosclerotic heart disease of native coronary artery without angina pectoris: Secondary | ICD-10-CM | POA: Insufficient documentation

## 2021-01-31 DIAGNOSIS — N189 Chronic kidney disease, unspecified: Secondary | ICD-10-CM | POA: Diagnosis not present

## 2021-01-31 DIAGNOSIS — Z86010 Personal history of colonic polyps: Secondary | ICD-10-CM | POA: Insufficient documentation

## 2021-01-31 DIAGNOSIS — Z7982 Long term (current) use of aspirin: Secondary | ICD-10-CM | POA: Insufficient documentation

## 2021-01-31 DIAGNOSIS — I5020 Unspecified systolic (congestive) heart failure: Secondary | ICD-10-CM

## 2021-01-31 DIAGNOSIS — Z8249 Family history of ischemic heart disease and other diseases of the circulatory system: Secondary | ICD-10-CM | POA: Insufficient documentation

## 2021-01-31 DIAGNOSIS — I5022 Chronic systolic (congestive) heart failure: Secondary | ICD-10-CM | POA: Insufficient documentation

## 2021-01-31 DIAGNOSIS — Z87891 Personal history of nicotine dependence: Secondary | ICD-10-CM | POA: Diagnosis not present

## 2021-01-31 DIAGNOSIS — D631 Anemia in chronic kidney disease: Secondary | ICD-10-CM | POA: Insufficient documentation

## 2021-01-31 DIAGNOSIS — K219 Gastro-esophageal reflux disease without esophagitis: Secondary | ICD-10-CM | POA: Diagnosis not present

## 2021-01-31 DIAGNOSIS — E785 Hyperlipidemia, unspecified: Secondary | ICD-10-CM | POA: Insufficient documentation

## 2021-01-31 NOTE — Patient Instructions (Addendum)
Continue weighing daily and call for an overnight weight gain of > 2 pounds or a weekly weight gain of >5 pounds.   Call us in the future if you'd like to schedule another appointment after you finish with the HF Clinic in [place]

## 2021-02-02 ENCOUNTER — Other Ambulatory Visit: Payer: Self-pay | Admitting: Internal Medicine

## 2021-02-06 DIAGNOSIS — D696 Thrombocytopenia, unspecified: Secondary | ICD-10-CM | POA: Diagnosis not present

## 2021-02-06 DIAGNOSIS — Z0000 Encounter for general adult medical examination without abnormal findings: Secondary | ICD-10-CM | POA: Diagnosis not present

## 2021-02-06 DIAGNOSIS — I428 Other cardiomyopathies: Secondary | ICD-10-CM | POA: Diagnosis not present

## 2021-02-06 DIAGNOSIS — E790 Hyperuricemia without signs of inflammatory arthritis and tophaceous disease: Secondary | ICD-10-CM | POA: Diagnosis not present

## 2021-02-06 DIAGNOSIS — N289 Disorder of kidney and ureter, unspecified: Secondary | ICD-10-CM | POA: Diagnosis not present

## 2021-02-21 ENCOUNTER — Telehealth (HOSPITAL_COMMUNITY): Payer: Self-pay | Admitting: Pharmacy Technician

## 2021-02-21 ENCOUNTER — Ambulatory Visit (HOSPITAL_COMMUNITY)
Admission: RE | Admit: 2021-02-21 | Discharge: 2021-02-21 | Disposition: A | Discharge status: Home / Self Care | Payer: PPO | Source: Ambulatory Visit | Attending: Cardiology | Admitting: Cardiology

## 2021-02-21 ENCOUNTER — Encounter (HOSPITAL_COMMUNITY): Payer: Self-pay | Admitting: Cardiology

## 2021-02-21 ENCOUNTER — Other Ambulatory Visit: Payer: Self-pay

## 2021-02-21 VITALS — BP 108/70 | HR 64 | Wt 210.0 lb

## 2021-02-21 DIAGNOSIS — I5022 Chronic systolic (congestive) heart failure: Secondary | ICD-10-CM

## 2021-02-21 DIAGNOSIS — R5383 Other fatigue: Secondary | ICD-10-CM | POA: Insufficient documentation

## 2021-02-21 DIAGNOSIS — Z79899 Other long term (current) drug therapy: Secondary | ICD-10-CM | POA: Diagnosis not present

## 2021-02-21 DIAGNOSIS — Z7982 Long term (current) use of aspirin: Secondary | ICD-10-CM | POA: Insufficient documentation

## 2021-02-21 DIAGNOSIS — I428 Other cardiomyopathies: Secondary | ICD-10-CM | POA: Insufficient documentation

## 2021-02-21 DIAGNOSIS — I110 Hypertensive heart disease with heart failure: Secondary | ICD-10-CM | POA: Insufficient documentation

## 2021-02-21 DIAGNOSIS — Z8249 Family history of ischemic heart disease and other diseases of the circulatory system: Secondary | ICD-10-CM | POA: Insufficient documentation

## 2021-02-21 DIAGNOSIS — I5020 Unspecified systolic (congestive) heart failure: Secondary | ICD-10-CM | POA: Diagnosis not present

## 2021-02-21 DIAGNOSIS — R0602 Shortness of breath: Secondary | ICD-10-CM | POA: Insufficient documentation

## 2021-02-21 DIAGNOSIS — I429 Cardiomyopathy, unspecified: Secondary | ICD-10-CM | POA: Diagnosis not present

## 2021-02-21 DIAGNOSIS — Z8673 Personal history of transient ischemic attack (TIA), and cerebral infarction without residual deficits: Secondary | ICD-10-CM | POA: Insufficient documentation

## 2021-02-21 LAB — CBC
HCT: 42.7 % (ref 39.0–52.0)
Hemoglobin: 13.8 g/dL (ref 13.0–17.0)
MCH: 32.2 pg (ref 26.0–34.0)
MCHC: 32.3 g/dL (ref 30.0–36.0)
MCV: 99.8 fL (ref 80.0–100.0)
Platelets: UNDETERMINED 10*3/uL (ref 150–400)
RBC: 4.28 MIL/uL (ref 4.22–5.81)
RDW: 12.6 % (ref 11.5–15.5)
WBC: 5.6 10*3/uL (ref 4.0–10.5)
nRBC: 0 % (ref 0.0–0.2)

## 2021-02-21 LAB — BASIC METABOLIC PANEL
Anion gap: 8 (ref 5–15)
BUN: 29 mg/dL — ABNORMAL HIGH (ref 8–23)
CO2: 28 mmol/L (ref 22–32)
Calcium: 8.9 mg/dL (ref 8.9–10.3)
Chloride: 101 mmol/L (ref 98–111)
Creatinine, Ser: 1.59 mg/dL — ABNORMAL HIGH (ref 0.61–1.24)
GFR, Estimated: 44 mL/min — ABNORMAL LOW (ref >60)
Glucose, Bld: 118 mg/dL — ABNORMAL HIGH (ref 70–99)
Potassium: 4.6 mmol/L (ref 3.5–5.1)
Sodium: 137 mmol/L (ref 135–145)

## 2021-02-21 LAB — IRON AND TIBC
Iron: 54 ug/dL (ref 45–182)
Saturation Ratios: 20 % (ref 17.9–39.5)
TIBC: 266 ug/dL (ref 250–450)
UIBC: 212 ug/dL

## 2021-02-21 LAB — FERRITIN: Ferritin: 199 ng/mL (ref 24–336)

## 2021-02-21 LAB — BRAIN NATRIURETIC PEPTIDE: B Natriuretic Peptide: 376.4 pg/mL — ABNORMAL HIGH (ref 0.0–100.0)

## 2021-02-21 MED ORDER — SPIRONOLACTONE 25 MG PO TABS: 12.5000 mg | ORAL_TABLET | Freq: Every day | ORAL | 3 refills | Status: DC

## 2021-02-21 MED ORDER — EMPAGLIFLOZIN 10 MG PO TABS: 10.0000 mg | ORAL_TABLET | Freq: Every day | ORAL | 11 refills | Status: DC

## 2021-02-21 NOTE — Telephone Encounter (Signed)
Patient was seen in clinic today and started on Jardiance. The current 30 day co-pay is $45, 90 days is $90, which is a barrier. Started an application for Gabriel Martin.   Will fax in once signatures are obtained.

## 2021-02-21 NOTE — Progress Notes (Signed)
PCP: [name], MD Cardiology: Dr. Garen Lah HF Cardiology: Dr. [name]  80 y.o. with history of CVA in 12/21 and nonischemic cardiomyopathy was referred for evaluation of CHF.  Beginning in 11/21, patient began to develop exertional dyspnea as well as bendopnea.  He had a syncopal episode in 12/21 at [facility] and was admitted.  He was found to have a left MCA CVA.  Echo showed EF 30-35%, TEE showed a negative bubble study.  LHC/RHC in 2/22 showed nonobstructive CAD with CI 2.1.  He was volume overloaded and was started on Lasix 40 mg daily.   Currently, he is short of breath after walking about 50 yards or with moderate activity like weed-eating.  Generalized fatigue.  He is short of breath showering.  He does ok with dressing and other ADLs.  No problems walking in his house.  No chest pain.  No lightheadedness.    ECG (personally reviewed, 3/22): NSR, inferior Qs, PVC, QRS 118 msec  Labs (12/21): LDL 75 Labs (2/22): K 4.3, creatinine 1.29  PMH:  1. CVA: Left MCA CVA in 12/21.  - TEE did not show PFO.  - 2 wk Zio monitor in 1/22 did not show atrial fibrillation.  2. HTN 3. BPH 4. GERD 5. Hyperlipidemia 6. Chronic systolic CHF: Nonischemic cardiomyopathy.   - Echo (12/21): EF 30-35%, mildly decreased RV systolic function.  - TEE (1/22): EF 30-35%, negative bubble study, mildly decreased RV systolic function.  - LHC/RHC (2/22): Nonobstructive CAD; mean RA 14 mmHg, PA 60/23 mean 35, mean PCWP 21 mmHg, CI 2.1, PVR 3.2  SH: Lives with wife in [place], retired, occasional ETOH (not heavy), remote smoker.   FH: Brother died at 27, sounds like SCD.  Nephew died at 44, sounds like SCD.  Mother with MI in her 48s.   ROS: All systems reviewed and negative except as per HPI.   Current Outpatient Medications  Medication Sig Dispense Refill  . acetaminophen (TYLENOL) 500 MG tablet Take 500 mg by mouth every 6 (six) hours as needed.    Marland Kitchen allopurinol (ZYLOPRIM) 100 MG tablet  Take 100 mg by mouth daily.    Marland Kitchen aspirin EC 81 MG EC tablet Take 1 tablet (81 mg total) by mouth daily. Swallow whole. 30 tablet 0  . atorvastatin (LIPITOR) 40 MG tablet Take 1 tablet (40 mg total) by mouth daily at 6 PM. 30 tablet 0  . empagliflozin (JARDIANCE) 10 MG TABS tablet Take 1 tablet (10 mg total) by mouth daily before breakfast. 30 tablet 11  . famotidine (PEPCID) 20 MG tablet Take 20 mg by mouth daily.    . furosemide (LASIX) 40 MG tablet Take 1 tablet (40 mg total) by mouth daily. 30 tablet 1  . gabapentin (NEURONTIN) 100 MG capsule Take 100 mg by mouth 2 (two) times daily.    . metoprolol succinate (TOPROL-XL) 25 MG 24 hr tablet Take 0.5 tablets (12.5 mg total) by mouth daily. 30 tablet 0  . sacubitril-valsartan (ENTRESTO) 24-26 MG Take 1 tablet by mouth 2 (two) times daily. 60 tablet 3  . spironolactone (ALDACTONE) 25 MG tablet Take 0.5 tablets (12.5 mg total) by mouth daily. 45 tablet 3   No current facility-administered medications for this encounter.   BP 108/70   Pulse 64   Wt 95.3 kg (210 lb)   SpO2 94%   BMI 33.89 kg/m  General: NAD Neck: No JVD, no thyromegaly or thyroid nodule.  Lungs: Clear to auscultation bilaterally with normal respiratory effort. CV:  Nondisplaced PMI.  Heart regular S1/S2, no S3/S4, no murmur.  Trace ankle edema.  No carotid bruit.  Normal pedal pulses.  Abdomen: Soft, nontender, no hepatosplenomegaly, no distention.  Skin: Intact without lesions or rashes.  Neurologic: Alert and oriented x 3.  Psych: Normal affect. Extremities: No clubbing or cyanosis.  HEENT: Normal.   Assessment/Plan: 1. Chronic systolic CHF: Nonischemic cardiomyopathy.  Echo in 12/21 with EF 30-35%, mildly decreased RV systolic function.  LHC in 2/22 showed mild nonobstructive CAD.  Cause of cardiomyopathy is uncertain.  Prior myocarditis is possible.  His brother and nephew had sudden death, I am uncertain if this was due to a cardiomyopathy or a myocardial infarction  (could there be a familial cardiomyopathy?).  He does not drink heavily.   - Would send Fe studies (?hemochromatosis).  - I will arrange for cardiac MRI to assess for infiltrative disease.  - Continue Toprol XL 12.5 mg daily.  - Continue Entresto 24/26 bid.   - Start spironolactone 12.5 mg daily.  - Start Farxiga 10 mg daily.  - BMET today and again in 10 days.  - Continue current Lasix but may need to decrease with starting [place].  2. H/o CVA: 12/21.  Cause is uncertain. TEE showed no PFO.  2 wk monitor showed no AF.  TEE and echo did not show LV thrombus.  I remain concerned that atrial fibrillation underlay his CVA, however this has not been seen. His cardiomyopathy does put him at risk for AF.  - Continue ASA 81 and atorvastatin.  - I think that he is a good candidate for a LINQ monitor.  Will refer to EP for placement.  Can monitor over time for AF.   Followup in 3 wks with clinical pharmacist for med titration.  Followup with me in 6 wks.   Loralie Champagne 02/21/2021

## 2021-02-21 NOTE — Patient Instructions (Addendum)
Labs done today. We will contact you only if your labs are abnormal.  START Jardiance 10mg  (1 tablet) by mouth daily.  START Spironolactone 12.5mg  (1/2 tablet) by mouth daily.  No other medication changes were made. Please continue all current medications as prescribed.  You have been referred to [facility] Cardiac Electrophysiology. They will contact you to schedule an appointment.  Your physician has requested that you have a cardiac MRI. Cardiac MRI uses a computer to create images of your heart as its beating, producing both still and moving pictures of your heart and major blood vessels. For further information please visit [email or web address] This has to be approved through your insurance company prior to scheduling, once approved we will contact you to schedule an appointment.   Your physician recommends that you schedule a follow-up appointment in: 10 days for a lab only appointment and in 3 & 6 weeks with Dr. [name]    If you have any questions or concerns before your next appointment please send [place] a message through [facility] or call our office at [phone].    TO LEAVE A MESSAGE FOR THE NURSE SELECT OPTION 2, PLEASE LEAVE A MESSAGE INCLUDING: . YOUR NAME . DATE OF BIRTH . CALL BACK NUMBER . REASON FOR CALL**this is important as we prioritize the call backs  YOU WILL RECEIVE A CALL BACK THE SAME DAY AS LONG AS YOU CALL BEFORE 4:00 PM   Do the following things EVERYDAY: 1) Weigh yourself in the morning before breakfast. Write it down and keep it in a log. 2) Take your medicines as prescribed 3) Eat low salt foods--Limit salt (sodium) to 2000 mg per day.  4) Stay as active as you can everyday 5) Limit all fluids for the day to less than 2 liters   At the [facility], you and your health needs are our priority. As part of our continuing mission to provide you with exceptional heart care, we have created designated Provider Care Teams. These Care Teams include  your primary Cardiologist (physician) and Advanced Practice Providers (APPs- Physician Assistants and Nurse Practitioners) who all work together to provide you with the care you need, when you need it.   You may see any of the following providers on your designated Care Team at your next follow up: Marland Kitchen Dr Glori Bickers . Dr Loralie Champagne . Darrick Grinder, NP . Lyda Jester, PA . Audry Riles, PharmD   Please be sure to bring in all your medications bottles to every appointment.

## 2021-02-22 ENCOUNTER — Telehealth (HOSPITAL_COMMUNITY): Payer: Self-pay | Admitting: Cardiology

## 2021-02-22 MED ORDER — FUROSEMIDE 40 MG PO TABS: 20.0000 mg | ORAL_TABLET | Freq: Every day | ORAL | 1 refills | Status: DC

## 2021-02-22 NOTE — Telephone Encounter (Signed)
-----   Message from [name], MD sent at 02/21/2021  4:43 PM EDT ----- With higher creatinine, would have him cut back on Lasix to 20 mg daily (starting Jardiance and spironolactone).

## 2021-02-22 NOTE — Telephone Encounter (Signed)
Pt returned call and aware of results

## 2021-02-24 NOTE — Telephone Encounter (Signed)
Sent in application via fax.  Will follow up.  

## 2021-03-06 ENCOUNTER — Other Ambulatory Visit
Admission: RE | Admit: 2021-03-06 | Discharge: 2021-03-06 | Disposition: A | Discharge status: Home / Self Care | Payer: PPO | Attending: Cardiology | Admitting: Cardiology

## 2021-03-06 ENCOUNTER — Telehealth (HOSPITAL_COMMUNITY): Payer: Self-pay | Admitting: Pharmacy Technician

## 2021-03-06 DIAGNOSIS — I5022 Chronic systolic (congestive) heart failure: Secondary | ICD-10-CM | POA: Insufficient documentation

## 2021-03-06 LAB — BASIC METABOLIC PANEL
Anion gap: 8 (ref 5–15)
BUN: 23 mg/dL (ref 8–23)
CO2: 27 mmol/L (ref 22–32)
Calcium: 9.1 mg/dL (ref 8.9–10.3)
Chloride: 105 mmol/L (ref 98–111)
Creatinine, Ser: 1.44 mg/dL — ABNORMAL HIGH (ref 0.61–1.24)
GFR, Estimated: 49 mL/min — ABNORMAL LOW (ref >60)
Glucose, Bld: 107 mg/dL — ABNORMAL HIGH (ref 70–99)
Potassium: 4.4 mmol/L (ref 3.5–5.1)
Sodium: 140 mmol/L (ref 135–145)

## 2021-03-06 NOTE — Telephone Encounter (Signed)
Advanced Heart Failure Patient Advocate Encounter  BI Cares would like for the patient to apply for and be denied [name] in order to get approved for [employer] assistance.  Gabriel Martin, (CSW) a message requesting assistance with this process.

## 2021-03-07 ENCOUNTER — Telehealth (HOSPITAL_COMMUNITY): Payer: Self-pay | Admitting: Licensed Clinical Social Worker

## 2021-03-07 DIAGNOSIS — I5032 Chronic diastolic (congestive) heart failure: Secondary | ICD-10-CM

## 2021-03-07 NOTE — Telephone Encounter (Signed)
CSW consulted by pharmacy team to help with Extra Help application so that they could finalize application for BI Cares to help with Jardiance.  CSW assisted pt in applying for Extra Help- will hear in 3-4 weeks regarding determination- informed pt he needs to call pharmacy advocate when he receives letter from [facility] so they can discuss next steps- pt expressed understanding  Will continue to follow and assist as needed  Jorge Ny, [facility] Desk#: [phone] Cell#: [phone]

## 2021-03-09 ENCOUNTER — Other Ambulatory Visit: Payer: Self-pay | Admitting: Internal Medicine

## 2021-03-14 ENCOUNTER — Encounter (HOSPITAL_COMMUNITY): Payer: Self-pay | Admitting: Cardiology

## 2021-03-14 ENCOUNTER — Ambulatory Visit (HOSPITAL_COMMUNITY)
Admission: RE | Admit: 2021-03-14 | Discharge: 2021-03-14 | Disposition: A | Discharge status: Home / Self Care | Payer: PPO | Source: Ambulatory Visit | Attending: Cardiology | Admitting: Cardiology

## 2021-03-14 ENCOUNTER — Other Ambulatory Visit: Payer: Self-pay

## 2021-03-14 VITALS — BP 104/64 | HR 63 | Wt 209.0 lb

## 2021-03-14 DIAGNOSIS — I2510 Atherosclerotic heart disease of native coronary artery without angina pectoris: Secondary | ICD-10-CM | POA: Insufficient documentation

## 2021-03-14 DIAGNOSIS — Z7982 Long term (current) use of aspirin: Secondary | ICD-10-CM | POA: Insufficient documentation

## 2021-03-14 DIAGNOSIS — Z79899 Other long term (current) drug therapy: Secondary | ICD-10-CM | POA: Insufficient documentation

## 2021-03-14 DIAGNOSIS — Z8249 Family history of ischemic heart disease and other diseases of the circulatory system: Secondary | ICD-10-CM | POA: Diagnosis not present

## 2021-03-14 DIAGNOSIS — I5022 Chronic systolic (congestive) heart failure: Secondary | ICD-10-CM | POA: Diagnosis not present

## 2021-03-14 DIAGNOSIS — Z87891 Personal history of nicotine dependence: Secondary | ICD-10-CM | POA: Insufficient documentation

## 2021-03-14 DIAGNOSIS — I110 Hypertensive heart disease with heart failure: Secondary | ICD-10-CM | POA: Insufficient documentation

## 2021-03-14 DIAGNOSIS — Z8673 Personal history of transient ischemic attack (TIA), and cerebral infarction without residual deficits: Secondary | ICD-10-CM | POA: Diagnosis not present

## 2021-03-14 DIAGNOSIS — I428 Other cardiomyopathies: Secondary | ICD-10-CM | POA: Insufficient documentation

## 2021-03-14 LAB — BASIC METABOLIC PANEL
Anion gap: 9 (ref 5–15)
BUN: 34 mg/dL — ABNORMAL HIGH (ref 8–23)
CO2: 27 mmol/L (ref 22–32)
Calcium: 9.1 mg/dL (ref 8.9–10.3)
Chloride: 102 mmol/L (ref 98–111)
Creatinine, Ser: 1.9 mg/dL — ABNORMAL HIGH (ref 0.61–1.24)
GFR, Estimated: 35 mL/min — ABNORMAL LOW (ref >60)
Glucose, Bld: 105 mg/dL — ABNORMAL HIGH (ref 70–99)
Potassium: 4.7 mmol/L (ref 3.5–5.1)
Sodium: 138 mmol/L (ref 135–145)

## 2021-03-14 MED ORDER — METOPROLOL SUCCINATE ER 25 MG PO TB24: 25.0000 mg | ORAL_TABLET | Freq: Every day | ORAL | 3 refills | Status: DC

## 2021-03-14 MED ORDER — SPIRONOLACTONE 25 MG PO TABS: 25.0000 mg | ORAL_TABLET | Freq: Every day | ORAL | 3 refills | Status: DC

## 2021-03-14 NOTE — Patient Instructions (Signed)
EKG done today.  Labs done today. We will contact you only if your labs are abnormal.  INCREASE Spironolactone to 25mg  (1 tablet) by mouth daily.  INCREASE Metoprolol (Toprolol XL) to 25mg  (1 tablet) by mouth daily at bedtime.  No other medication changes were made. Please continue all current medications as prescribed.  Your physician recommends that you schedule a follow-up appointment in: 10 days for a lab only appointment(can be done in [place]) 3 weeks for an appointment with our Clinic Pharmacist and in 6 weeks with Dr. [name].   If you have any questions or concerns before your next appointment please send [place] a message through [place] or call our office at [phone].    TO LEAVE A MESSAGE FOR THE NURSE SELECT OPTION 2, PLEASE LEAVE A MESSAGE INCLUDING: . YOUR NAME . DATE OF BIRTH . CALL BACK NUMBER . REASON FOR CALL**this is important as we prioritize the call backs  YOU WILL RECEIVE A CALL BACK THE SAME DAY AS LONG AS YOU CALL BEFORE 4:00 PM   Do the following things EVERYDAY: 1) Weigh yourself in the morning before breakfast. Write it down and keep it in a log. 2) Take your medicines as prescribed 3) Eat low salt foods--Limit salt (sodium) to 2000 mg per day.  4) Stay as active as you can everyday 5) Limit all fluids for the day to less than 2 liters   At the [facility], you and your health needs are our priority. As part of our continuing mission to provide you with exceptional heart care, we have created designated Provider Care Teams. These Care Teams include your primary Cardiologist (physician) and Advanced Practice Providers (APPs- Physician Assistants and Nurse Practitioners) who all work together to provide you with the care you need, when you need it.   You may see any of the following providers on your designated Care Team at your next follow up: Marland Kitchen Dr Glori Bickers . Dr Loralie Champagne . Darrick Grinder, NP . Lyda Jester, PA . Audry Riles, PharmD   Please be sure to bring in all your medications bottles to every appointment.

## 2021-03-15 ENCOUNTER — Encounter: Payer: Self-pay | Admitting: Cardiology

## 2021-03-15 ENCOUNTER — Ambulatory Visit (INDEPENDENT_AMBULATORY_CARE_PROVIDER_SITE_OTHER): Payer: PPO | Admitting: Cardiology

## 2021-03-15 VITALS — BP 98/60 | HR 72 | Ht 66.0 in | Wt 208.4 lb

## 2021-03-15 DIAGNOSIS — I5020 Unspecified systolic (congestive) heart failure: Secondary | ICD-10-CM

## 2021-03-15 DIAGNOSIS — I428 Other cardiomyopathies: Secondary | ICD-10-CM | POA: Diagnosis not present

## 2021-03-15 DIAGNOSIS — I639 Cerebral infarction, unspecified: Secondary | ICD-10-CM

## 2021-03-15 DIAGNOSIS — Z8673 Personal history of transient ischemic attack (TIA), and cerebral infarction without residual deficits: Secondary | ICD-10-CM | POA: Diagnosis not present

## 2021-03-15 NOTE — Progress Notes (Signed)
Electrophysiology Office Note:    Date:  03/15/2021   ID:  AJA BOLANDER, DOB January 23, 1941, MRN [id number]  PCP:  Gabriel Harrier, MD  [place] Cardiologist:  No primary care provider on file.  [facility] Electrophysiologist:  None   Referring MD: Gabriel Dresser, MD   Chief Complaint: TIA  History of Present Illness:    Gabriel Martin is a 80 y.o. male who presents for an evaluation of TIA at the request of Dr. Aundra Martin. Their medical history includes CKD, GERD, hyperlipidemia, stroke and thrombocytopenia.  The patient was last seen by Dr. Aundra Martin on February 21, 2021 for his nonischemic cardiomyopathy.  He has a history of a stroke after a syncopal episode in December 2021 when he was at [employer].  Echo showed a negative bubble study.  Monitoring of the patient's heart rhythms have not shown any evidence of atrial fibrillation.  He did complete a 2-week ZIO monitor in January 2022 which did not show A. fib.   Past Medical History:  Diagnosis Date  . Acid reflux 05/12/2015  . Anemia   . Arthritis    finger thumb  . Benign enlargement of prostate   . Benign neoplasm of large bowel   . Benign prostatic hyperplasia with urinary obstruction 05/12/2015  . BPH (benign prostatic hyperplasia)   . BPH with obstruction/lower urinary tract symptoms 05/12/2015  . Change in blood platelet count 05/12/2015  . CHF (congestive heart failure) ([place])   . Chronic kidney disease 05/12/2015  . GERD (gastroesophageal reflux disease)   . HLD (hyperlipidemia) 05/12/2015  . Hyperlipidemia   . Nocturia associated with benign prostatic hypertrophy   . Penile ulcer 05/12/2015  . Skin cancer   . Stroke ([place])   . Thrombocytopenia ([place])     Past Surgical History:  Procedure Laterality Date  . CATARACT EXTRACTION W/PHACO Left 02/29/2016   Procedure: CATARACT EXTRACTION PHACO AND INTRAOCULAR LENS PLACEMENT ([place]) left;  Surgeon: Gabriel Koyanagi, MD;  Location: [place];   Service: Ophthalmology;  Laterality: Left;  . CATARACT EXTRACTION W/PHACO Right 04/25/2016   Procedure: CATARACT EXTRACTION PHACO AND INTRAOCULAR LENS PLACEMENT (IOC);  Surgeon: Gabriel Koyanagi, MD;  Location: [place];  Service: Ophthalmology;  Laterality: Right;  . COLONOSCOPY WITH PROPOFOL N/A 07/01/2017   Procedure: COLONOSCOPY WITH PROPOFOL;  Surgeon: Gabriel Silvas, MD;  Location: [facility] ENDOSCOPY;  Service: Endoscopy;  Laterality: N/A;  . EYE SURGERY    . HEMORRHOID SURGERY    . RIGHT/LEFT HEART CATH AND CORONARY ANGIOGRAPHY Bilateral 01/10/2021   Procedure: RIGHT/LEFT HEART CATH AND CORONARY ANGIOGRAPHY;  Surgeon: Gabriel Bush, MD;  Location: [place] CV LAB;  Service: Cardiovascular;  Laterality: Bilateral;  . TEE WITHOUT CARDIOVERSION N/A 11/30/2020   Procedure: TRANSESOPHAGEAL ECHOCARDIOGRAM (TEE);  Surgeon: Gabriel Sable, MD;  Location: [facility] ORS;  Service: Cardiovascular;  Laterality: N/A;    Current Medications: Current Meds  Medication Sig  . acetaminophen (TYLENOL) 500 MG tablet Take 500 mg by mouth every 6 (six) hours as needed.  Marland Kitchen allopurinol (ZYLOPRIM) 100 MG tablet Take 100 mg by mouth daily.  Marland Kitchen aspirin EC 81 MG EC tablet Take 1 tablet (81 mg total) by mouth daily. Swallow whole.  Marland Kitchen atorvastatin (LIPITOR) 40 MG tablet Take 1 tablet (40 mg total) by mouth daily at 6 PM.  . empagliflozin (JARDIANCE) 10 MG TABS tablet Take 1 tablet (10 mg total) by mouth daily before breakfast.  . famotidine (PEPCID) 20 MG tablet Take 20 mg by mouth daily.  Marland Kitchen  furosemide (LASIX) 20 MG tablet Take 20 mg by mouth daily.  Marland Kitchen gabapentin (NEURONTIN) 100 MG capsule Take 100 mg by mouth 2 (two) times daily.  . metoprolol succinate (TOPROL-XL) 25 MG 24 hr tablet Take 1 tablet (25 mg total) by mouth at bedtime.  . sacubitril-valsartan (ENTRESTO) 24-26 MG Take 1 tablet by mouth 2 (two) times daily.  Marland Kitchen spironolactone (ALDACTONE) 25 MG tablet Take 1 tablet (25 mg total) by mouth  daily.     Allergies:   Patient has no known allergies.   Social History   Socioeconomic History  . Marital status: Married    Spouse name: Not on file  . Number of children: Not on file  . Years of education: Not on file  . Highest education level: Not on file  Occupational History  . Not on file  Tobacco Use  . Smoking status: Former Smoker    Types: Cigarettes    Quit date: 11/26/1978    Years since quitting: 42.3  . Smokeless tobacco: Never Used  . Tobacco comment: quit 40 years ago  Vaping Use  . Vaping Use: Never used  Substance and Sexual Activity  . Alcohol use: Yes    Alcohol/week: 6.0 standard drinks    Types: 6 Cans of beer per week    Comment: occasional  . Drug use: No  . Sexual activity: Never  Other Topics Concern  . Not on file  Social History Narrative  . Not on file   Social Determinants of Health   Financial Resource Strain: Not on file  Food Insecurity: Not on file  Transportation Needs: Not on file  Physical Activity: Not on file  Stress: Not on file  Social Connections: Not on file     Family History: The patient's family history includes Heart attack in his mother; Heart attack (age of onset: 84) in his nephew; Heart attack (age of onset: 64) in his brother; Heart disease in his brother.  ROS:   Please see the history of present illness.    All other systems reviewed and are negative.  EKGs/Labs/Other Studies Reviewed:    The following studies were reviewed today:  01/10/2021 LHC/RHC Conclusions: 1. Mild, non-obstructive coronary artery disease consistent with non-ischemic cardiomyopathy. 2. Mildly to moderately elevated left heart and pulmonary artery pressures. 3. Moderately elevated right heart filling pressures. 4. Mildly reduced Fick cardiac output/index. Recommendations: 1. Optimize goal-directed medical therapy for chronic HFrEF. 2. Increase furosemide to 40 mg daily. 3. Medical therapy and risk factor modification to  prevent progression of mild coronary artery disease.   12/16/2020 Zio personally reviewed No evidence of A. fib or flutter 11.6% burden of PVCs   EKG:  The ekg ordered today demonstrates sinus rhythm.  First-degree AV delay.  No PVCs  Recent Labs: 11/03/2020: ALT 24; Magnesium 2.2 02/21/2021: B Natriuretic Peptide 376.4; Hemoglobin 13.8; Platelets PLATELET CLUMPS NOTED ON SMEAR, UNABLE TO ESTIMATE 03/14/2021: BUN 34; Creatinine, Ser 1.90; Potassium 4.7; Sodium 138  Recent Lipid Panel    Component Value Date/Time   CHOL 137 11/04/2020 0526   TRIG 68 11/04/2020 0526   HDL 48 11/04/2020 0526   CHOLHDL 2.9 11/04/2020 0526   VLDL 14 11/04/2020 0526   LDLCALC 75 11/04/2020 0526    Physical Exam:    VS:  BP 98/60 (BP Location: Left Arm, Patient Position: Sitting, Cuff Size: Normal)   Pulse 72   Ht 5\' 6"  (1.676 m)   Wt 208 lb 6 oz (94.5 kg)   SpO2  96%   BMI 33.63 kg/m     Wt Readings from Last 3 Encounters:  03/15/21 208 lb 6 oz (94.5 kg)  03/14/21 209 lb (94.8 kg)  02/21/21 210 lb (95.3 kg)    Orthostatics Flat 72, 117/75 Sitting 73, 116/71 Standing 78, 111/67 Standing 3 minutes 75, 101/66    GEN:  Well nourished, well developed in no acute distress HEENT: Normal NECK: No JVD; No carotid bruits LYMPHATICS: No lymphadenopathy CARDIAC: RRR, no murmurs, rubs, gallops RESPIRATORY:  Clear to auscultation without rales, wheezing or rhonchi  ABDOMEN: Soft, non-tender, non-distended MUSCULOSKELETAL:  No edema; No deformity  SKIN: Warm and dry NEUROLOGIC:  Alert and oriented x 3 PSYCHIATRIC:  Normal affect   ASSESSMENT:    1. Acute embolic stroke ([place])   2. History of embolic stroke   3. HFrEF (heart failure with reduced ejection fraction) ([place])   4. NICM (nonischemic cardiomyopathy) ([place])    PLAN:    In order of problems listed above:  1. History of CVA No clear cause based on the previously performed work-up.  We suspect there is an embolic source of his CVA.   Prior ZIO monitor did not show evidence of atrial fibrillation.  Plan to implant a loop recorder.  I discussed the loop recorder with the patient in detail during today's visit including the risks and he wishes to proceed.  2.  Chronic combined systolic and diastolic heart failure Nonischemic.  NYHA class II.  Warm and dry. EF 30% on January 2022 TEE. 1 possible etiology could be frequent PVCs as his most recent ZIO monitor showed 11% burden of PVCs.  The loop recorder will help quantify PVCs which can help further interrogate this possibility. Continue medical therapy per Dr. Aundra Martin.   Medication Adjustments/Labs and Tests Ordered: Current medicines are reviewed at length with the patient today.  Concerns regarding medicines are outlined above.  Orders Placed This Encounter  Procedures  . EKG 12-Lead   No orders of the defined types were placed in this encounter.    Signed, Hilton Cork. Quentin Ore, MD, [facility], [facility] 03/15/2021 5:14 PM    Electrophysiology [facility]  ------------------------------------------------------------------------------------------------  SURGEON:  Lars Mage, MD    PREPROCEDURE DIAGNOSIS:  Cryptogenic stroke    POSTPROCEDURE DIAGNOSIS:  Cryptogenic stroke     PROCEDURES:   1. Implantable loop recorder implantation    INTRODUCTION:  Gabriel Martin is a 80 y.o. patient with a history of cryptogenic stroke.  Outpatient telemetry has been reviewed and not shown atrial fibrillation. The patient therefore presents today for implantable loop implantation.     DESCRIPTION OF PROCEDURE:  Informed written consent was obtained.  The patient required no sedation for the procedure today.  Mapping over the patient's chest was performed to identify the area where electrograms were most prominent for ILR recording.  This area was found to be the left parasternal region over the 4th intercostal space. The patients left chest was therefore  prepped and draped in the usual sterile fashion. The skin overlying the left parasternal region was infiltrated with lidocaine for local analgesia.  A 0.5-cm incision was made over the left parasternal region over the 3rd intercostal space.  A subcutaneous ILR pocket was fashioned using a combination of sharp and blunt dissection.  A Medtronic Reveal Linq model [id number]) implantable loop recorder was then placed into the pocket  R waves were very prominent and measured >0.23mV.  Steri- Strips and a sterile dressing were then applied.  There were no early apparent complications.     CONCLUSIONS:   1. Successful implantation of a Medtronic Reveal LINQ implantable loop recorder for a history of cryptogenic stroke  2. No early apparent complications.   Lars Mage, MD 01/23/2021 3:51 PM

## 2021-03-15 NOTE — Progress Notes (Signed)
PCP: [name], MD Cardiology: Dr. Garen Lah HF Cardiology: Dr. [name]  80 y.o. with history of CVA in 12/21 and nonischemic cardiomyopathy was referred for evaluation of CHF.  Beginning in 11/21, patient began to develop exertional dyspnea as well as bendopnea.  He had a syncopal episode in 12/21 at [facility] and was admitted.  He was found to have a left MCA CVA.  Echo showed EF 30-35%, TEE showed a negative bubble study.  LHC/RHC in 2/22 showed nonobstructive CAD with CI 2.1.  He was volume overloaded and was started on Lasix 40 mg daily.   Stable symptoms, fatigues easily.  Gets short of breath when he walks a long distance.  No orthopnea/PND. No chest pain.  No lightheadedness.  He mows his grass and does other yardwork.  No palpitations, he is in NSR today. Weight down 1 lb.   ECG (personally reviewed): NSR, 1st degree AVB, nonspecific T wave changes  Labs (12/21): LDL 75 Labs (2/22): K 4.3, creatinine 1.29 Labs (3/22): Transferrin saturation normal, ferritin normal Labs (4/22): K 4.4, creatinine 1.44  PMH:  1. CVA: Left MCA CVA in 12/21.  - TEE did not show PFO.  - 2 wk Zio monitor in 1/22 did not show atrial fibrillation.  2. HTN 3. BPH 4. GERD 5. Hyperlipidemia 6. Chronic systolic CHF: Nonischemic cardiomyopathy.   - Echo (12/21): EF 30-35%, mildly decreased RV systolic function.  - TEE (1/22): EF 30-35%, negative bubble study, mildly decreased RV systolic function.  - LHC/RHC (2/22): Nonobstructive CAD; mean RA 14 mmHg, PA 60/23 mean 35, mean PCWP 21 mmHg, CI 2.1, PVR 3.2  SH: Lives with wife in [place], retired, occasional ETOH (not heavy), remote smoker.   FH: Brother died at 84, sounds like SCD.  Nephew died at 41, sounds like SCD.  Mother with MI in her 48s.   ROS: All systems reviewed and negative except as per HPI.   Current Outpatient Medications  Medication Sig Dispense Refill  . acetaminophen (TYLENOL) 500 MG tablet Take 500 mg by mouth every 6  (six) hours as needed.    Marland Kitchen allopurinol (ZYLOPRIM) 100 MG tablet Take 100 mg by mouth daily.    Marland Kitchen aspirin EC 81 MG EC tablet Take 1 tablet (81 mg total) by mouth daily. Swallow whole. 30 tablet 0  . atorvastatin (LIPITOR) 40 MG tablet Take 1 tablet (40 mg total) by mouth daily at 6 PM. 30 tablet 0  . empagliflozin (JARDIANCE) 10 MG TABS tablet Take 1 tablet (10 mg total) by mouth daily before breakfast. 30 tablet 11  . famotidine (PEPCID) 20 MG tablet Take 20 mg by mouth daily.    . furosemide (LASIX) 20 MG tablet Take 20 mg by mouth daily.    Marland Kitchen gabapentin (NEURONTIN) 100 MG capsule Take 100 mg by mouth 2 (two) times daily.    . sacubitril-valsartan (ENTRESTO) 24-26 MG Take 1 tablet by mouth 2 (two) times daily. 60 tablet 3  . metoprolol succinate (TOPROL-XL) 25 MG 24 hr tablet Take 1 tablet (25 mg total) by mouth at bedtime. 90 tablet 3  . spironolactone (ALDACTONE) 25 MG tablet Take 1 tablet (25 mg total) by mouth daily. 90 tablet 3   No current facility-administered medications for this encounter.   BP 104/64   Pulse 63   Wt 94.8 kg (209 lb)   SpO2 93%   BMI 33.73 kg/m  General: NAD Neck: No JVD, no thyromegaly or thyroid nodule.  Lungs: Clear to auscultation bilaterally with  normal respiratory effort. CV: Nondisplaced PMI.  Heart regular S1/S2, no S3/S4, no murmur.  No peripheral edema.  No carotid bruit.  Normal pedal pulses.  Abdomen: Soft, nontender, no hepatosplenomegaly, no distention.  Skin: Intact without lesions or rashes.  Neurologic: Alert and oriented x 3.  Psych: Normal affect. Extremities: No clubbing or cyanosis.  HEENT: Normal.   Assessment/Plan: 1. Chronic systolic CHF: Nonischemic cardiomyopathy.  Echo in 12/21 with EF 30-35%, mildly decreased RV systolic function.  LHC in 2/22 showed mild nonobstructive CAD.  Cause of cardiomyopathy is uncertain.  Prior myocarditis is possible.  His brother and nephew had sudden death, I am uncertain if this was due to a  cardiomyopathy or a myocardial infarction (could there be a familial cardiomyopathy?).  He does not drink heavily.  Fe studies negative. NYHA class II symptoms, not volume overloaded on exam.  - I will arrange for cardiac MRI to assess for infiltrative disease => scheduled 04/11/21.  - Increase Toprol XL to 25 mg daily.  - Continue Entresto 24/26 bid.   - Increase spironolactone to 25 mg daily.  BMET today and in 10 days.  - Continue Farxiga 10 mg daily.  - Continue current Lasix but may need to decrease depending on creatinine trend.   2. H/o CVA: 12/21.  Cause is uncertain. TEE showed no PFO.  2 wk monitor showed no AF.  TEE and echo did not show LV thrombus.  I remain concerned that atrial fibrillation underlay his CVA, however this has not been seen. His cardiomyopathy does put him at risk for AF.  - Continue ASA 81 and atorvastatin.  - I think that he is a good candidate for a LINQ monitor.  Have referred to EP for placement.  Can monitor over time for AF.   Followup 3 wks with HF pharmacist for med titration and 6 wks with me.   Loralie Champagne 03/15/2021

## 2021-03-15 NOTE — Patient Instructions (Addendum)
Medication Instructions:  Your physician recommends that you continue on your current medications as directed. Please refer to the Current Medication list given to you today.  Labwork: None ordered.  Testing/Procedures: None ordered.  Follow-Up:  Your physician wants you to follow-up in: as needed with Dr. Quentin Ore.     Implantable Loop Recorder Placement, Care After This sheet gives you information about how to care for yourself after your procedure. Your health care provider may also give you more specific instructions. If you have problems or questions, contact your health care provider. What can I expect after the procedure? After the procedure, it is common to have:  Soreness or discomfort near the incision.  Some swelling or bruising near the incision.  Follow these instructions at home: Incision care  1.  Leave your outer dressing on for 72 hours.  After 72 hours you can remove your outer dressing and shower. 2. Leave adhesive strips in place. These skin closures may need to stay in place for 1-2 weeks. If adhesive strip edges start to loosen and curl up, you may trim the loose edges.  You may remove the strips if they have not fallen off after 2 weeks. 3. Check your incision area every day for signs of infection. Check for: a. Redness, swelling, or pain. b. Fluid or blood. c. Warmth. d. Pus or a bad smell. 4. Do not take baths, swim, or use a hot tub until your incision is completely healed. 5. If your wound site starts to bleed apply pressure.      If you have any questions/concerns please call the device clinic at [phone].  Activity  Return to your normal activities.  General instructions  Follow instructions from your health care provider about how to manage your implantable loop recorder and transmit the information. Learn how to activate a recording if this is necessary for your type of device.  Do not go through a metal detection gate, and do not let  someone hold a metal detector over your chest. Show your ID card.  Do not have an MRI unless you check with your health care provider first.  Take over-the-counter and prescription medicines only as told by your health care provider.  Keep all follow-up visits as told by your health care provider. This is important. Contact a health care provider if:  You have redness, swelling, or pain around your incision.  You have a fever.  You have pain that is not relieved by your pain medicine.  You have triggered your device because of fainting (syncope) or because of a heartbeat that feels like it is racing, slow, fluttering, or skipping (palpitations). Get help right away if you have:  Chest pain.  Difficulty breathing. Summary  After the procedure, it is common to have soreness or discomfort near the incision.  Change your dressing as told by your health care provider.  Follow instructions from your health care provider about how to manage your implantable loop recorder and transmit the information.  Keep all follow-up visits as told by your health care provider. This is important. This information is not intended to replace advice given to you by your health care provider. Make sure you discuss any questions you have with your health care provider. Document Released: 10/24/2015 Document Revised: 12/28/2017 Document Reviewed: 12/28/2017 Elsevier Patient Education  2020 [employer].

## 2021-03-16 ENCOUNTER — Telehealth (HOSPITAL_COMMUNITY): Payer: Self-pay | Admitting: Cardiology

## 2021-03-16 DIAGNOSIS — I5020 Unspecified systolic (congestive) heart failure: Secondary | ICD-10-CM

## 2021-03-16 MED ORDER — FUROSEMIDE 20 MG PO TABS: 20.0000 mg | ORAL_TABLET | ORAL | 1 refills | Status: DC

## 2021-03-16 NOTE — Telephone Encounter (Signed)
-----   Message from [name], MD sent at 03/15/2021 12:05 AM EDT ----- Decrease Lasix to 20 mg every other day.  Repeat BMET 1 week.

## 2021-03-16 NOTE — Telephone Encounter (Signed)
Pt aware and voiced understanding Pt scheduled to have repeat labs done in [place]

## 2021-03-24 ENCOUNTER — Other Ambulatory Visit
Admission: RE | Admit: 2021-03-24 | Discharge: 2021-03-24 | Disposition: A | Discharge status: Home / Self Care | Payer: PPO | Attending: Cardiology | Admitting: Cardiology

## 2021-03-24 DIAGNOSIS — I5020 Unspecified systolic (congestive) heart failure: Secondary | ICD-10-CM | POA: Insufficient documentation

## 2021-03-24 LAB — BASIC METABOLIC PANEL
Anion gap: 9 (ref 5–15)
BUN: 31 mg/dL — ABNORMAL HIGH (ref 8–23)
CO2: 24 mmol/L (ref 22–32)
Calcium: 9 mg/dL (ref 8.9–10.3)
Chloride: 107 mmol/L (ref 98–111)
Creatinine, Ser: 1.54 mg/dL — ABNORMAL HIGH (ref 0.61–1.24)
GFR, Estimated: 45 mL/min — ABNORMAL LOW (ref >60)
Glucose, Bld: 98 mg/dL (ref 70–99)
Potassium: 4.6 mmol/L (ref 3.5–5.1)
Sodium: 140 mmol/L (ref 135–145)

## 2021-04-07 ENCOUNTER — Telehealth (HOSPITAL_COMMUNITY): Payer: Self-pay | Admitting: Emergency Medicine

## 2021-04-07 NOTE — Telephone Encounter (Signed)
Attempted to call patient regarding upcoming cardiac MR appointment. Left message on voicemail with name and callback number Caldonia Leap RN Navigator Cardiac Imaging [facility] [phone] Office [phone] Cell  

## 2021-04-10 ENCOUNTER — Other Ambulatory Visit: Payer: Self-pay

## 2021-04-10 MED ORDER — ENTRESTO 24-26 MG PO TABS: 1.0000 | ORAL_TABLET | Freq: Two times a day (BID) | ORAL | 0 refills | Status: DC

## 2021-04-11 ENCOUNTER — Encounter (HOSPITAL_COMMUNITY): Payer: PPO | Admitting: Cardiology

## 2021-04-11 ENCOUNTER — Ambulatory Visit (HOSPITAL_COMMUNITY)
Admission: RE | Admit: 2021-04-11 | Discharge: 2021-04-11 | Disposition: A | Discharge status: Home / Self Care | Payer: PPO | Source: Ambulatory Visit | Attending: Cardiology | Admitting: Cardiology

## 2021-04-11 ENCOUNTER — Other Ambulatory Visit: Payer: Self-pay

## 2021-04-11 DIAGNOSIS — I429 Cardiomyopathy, unspecified: Secondary | ICD-10-CM | POA: Diagnosis not present

## 2021-04-11 IMAGING — MR MR CARD MORPHOLOGY WO/W CM
45 of 48 series · 45 of 48 positions shown · IV contrast (gadavist)
Comparison: none

CLINICAL DATA: Nonischemic cardiomyopathy

EXAM:
CARDIAC MRI
TECHNIQUE: The patient was scanned on a 1.5 Tesla GE magnet. A dedicated
cardiac coil was used. Functional imaging was done using Fiesta
sequences. [DATE], and 4 chamber views were done to assess for RWMA's.
Modified JAHMI rule using a short axis stack was used to
calculate an ejection fraction on a dedicated work station using
Circle software. The patient received 8 cc of Gadavist. After 10
minutes inversion recovery sequences were used to assess for
infiltration and scar tissue.

[Series 4: [id number] · axial · 8.0mm · 1.56mm/px · 1 of 16 slices shown]
[im 1/16]
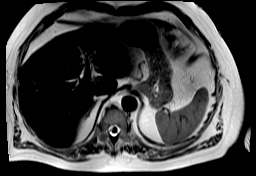

[Series 8: bSSFP · oblique · 8.0mm · 1.79mm/px · 1 of 25 slices shown (1 of 23)]
[im 1/25]
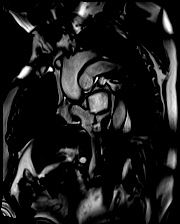

[Series 9: bSSFP · oblique · 8.0mm · 1.79mm/px · 1 of 25 slices shown (2 of 23)]
[im 1/25]
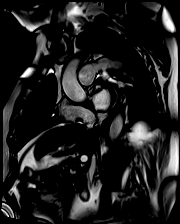

[Series 10: bSSFP · oblique · 8.0mm · 1.79mm/px · 1 of 25 slices shown (3 of 23)]
[im 1/25]
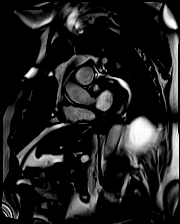

[Series 11: bSSFP · oblique · 8.0mm · 1.79mm/px · 1 of 25 slices shown (4 of 23)]
[im 1/25]
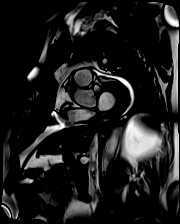

[Series 12: bSSFP · oblique · 8.0mm · 1.79mm/px · 1 of 25 slices shown (5 of 23)]
[im 1/25]
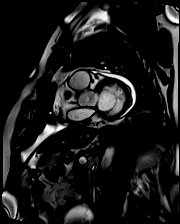

[Series 13: bSSFP · oblique · 8.0mm · 1.79mm/px · 1 of 25 slices shown (6 of 23)]
[im 1/25]
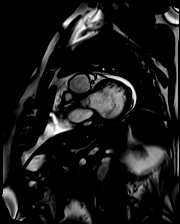

[Series 14: bSSFP · oblique · 8.0mm · 1.79mm/px · 1 of 25 slices shown (7 of 23)]
[im 1/25]
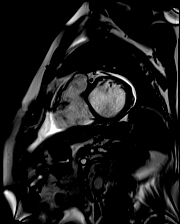

[Series 15: bSSFP · oblique · 8.0mm · 1.79mm/px · 1 of 25 slices shown (8 of 23)]
[im 1/25]
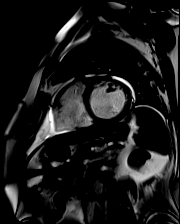

[Series 16: bSSFP · oblique · 8.0mm · 1.79mm/px · 1 of 25 slices shown (9 of 23)]
[im 1/25]
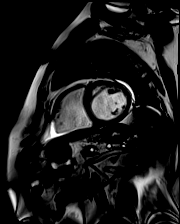

[Series 17: bSSFP · oblique · 8.0mm · 1.79mm/px · 1 of 25 slices shown (10 of 23)]
[im 1/25]
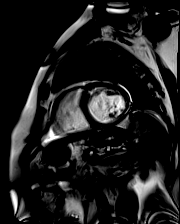

[Series 18: bSSFP · oblique · 8.0mm · 1.79mm/px · 1 of 25 slices shown (11 of 23)]
[im 1/25]
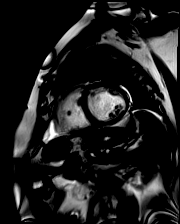

[Series 19: bSSFP · oblique · 8.0mm · 1.79mm/px · 1 of 25 slices shown (12 of 23)]
[im 1/25]
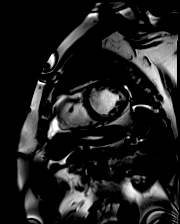

[Series 20: bSSFP · oblique · 8.0mm · 1.79mm/px · 1 of 25 slices shown (13 of 23)]
[im 1/25]
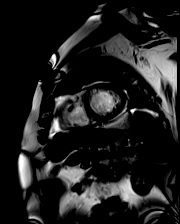

[Series 21: bSSFP · oblique · 8.0mm · 1.79mm/px · 1 of 25 slices shown (14 of 23)]
[im 1/25]
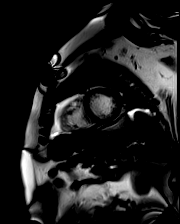

[Series 22: bSSFP · oblique · 8.0mm · 1.79mm/px · 1 of 25 slices shown (15 of 23)]
[im 1/25]
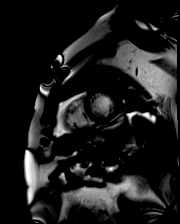

[Series 23: bSSFP · oblique · 8.0mm · 1.79mm/px · 1 of 25 slices shown (16 of 23)]
[im 1/25]
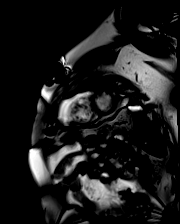

[Series 24: bSSFP · oblique · 8.0mm · 1.79mm/px · 1 of 25 slices shown (17 of 23)]
[im 1/25]
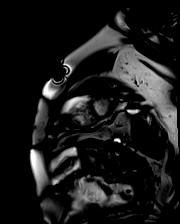

[Series 25: bSSFP · oblique · 8.0mm · 1.79mm/px · 1 of 25 slices shown (18 of 23)]
[im 1/25]
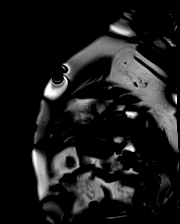

[Series 26: bSSFP · oblique · 8.0mm · 1.79mm/px · 1 of 25 slices shown (19 of 23)]
[im 1/25]
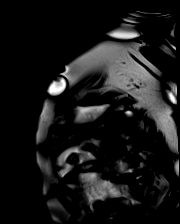

[Series 27: bSSFP · oblique · 8.0mm · 1.79mm/px · 1 of 25 slices shown (20 of 23)]
[im 1/25]
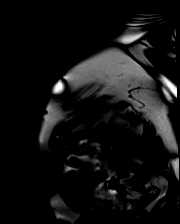

[Series 28: (id)_long_t1 · oblique · 8.0mm · 2.08mm/px · 1 of 24 slices shown]
[im 1/24]
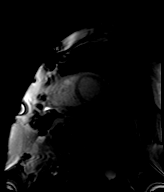

[Series 29: (id)_long_t1_moco · oblique · 8.0mm · 2.08mm/px · 1 of 24 slices shown]
[im 1/24]
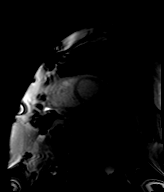

[Series 32: (id)_trufi · oblique · 8.0mm · 2.08mm/px · 1 of 9 slices shown]
[im 1/9]
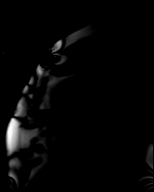

[Series 33: (id)_trufi_moco · oblique · 8.0mm · 2.08mm/px · 1 of 9 slices shown]
[im 1/9]
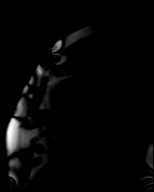

[Series 36: bSSFP · axial · 6.0mm · 1.41mm/px · 1 of 25 slices shown (21 of 23)]
[im 1/25]
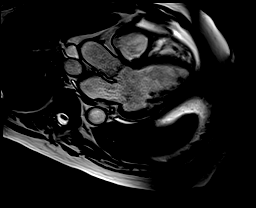

[Series 37: bSSFP · oblique · 6.0mm · 1.56mm/px · 1 of 25 slices shown (22 of 23)]
[im 1/25]
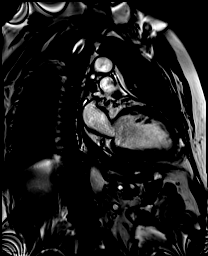

[Series 40: cine_trufi_cs_rt_short axis · oblique · 8.0mm · 1.73mm/px · 1 of 49 slices shown (1 of 14)]
[im 1/49]
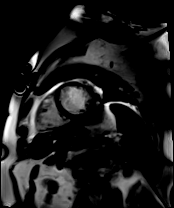

[Series 40: cine_trufi_cs_rt_short axis · oblique · 8.0mm · 1.73mm/px · 1 of 49 slices shown (2 of 14)]
[im 1/49]
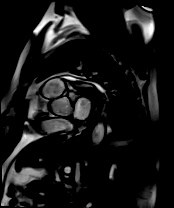

[Series 40: cine_trufi_cs_rt_short axis · oblique · 8.0mm · 1.73mm/px · 1 of 49 slices shown (3 of 14)]
[im 1/49]
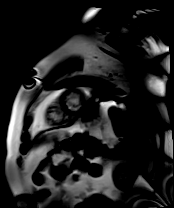

[Series 40: cine_trufi_cs_rt_short axis · oblique · 8.0mm · 1.73mm/px · 1 of 49 slices shown (4 of 14)]
[im 1/49]
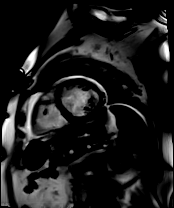

[Series 40: cine_trufi_cs_rt_short axis · oblique · 8.0mm · 1.73mm/px · 1 of 49 slices shown (5 of 14)]
[im 1/49]
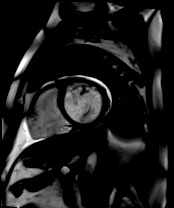

[Series 40: cine_trufi_cs_rt_short axis · oblique · 8.0mm · 1.73mm/px · 1 of 49 slices shown (6 of 14)]
[im 1/49]
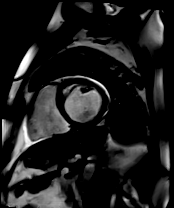

[Series 40: cine_trufi_cs_rt_short axis · oblique · 8.0mm · 1.73mm/px · 1 of 49 slices shown (7 of 14)]
[im 1/49]
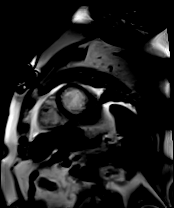

[Series 40: cine_trufi_cs_rt_short axis · oblique · 8.0mm · 1.73mm/px · 1 of 49 slices shown (8 of 14)]
[im 1/49]
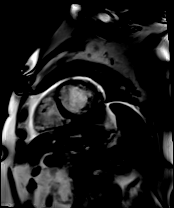

[Series 40: cine_trufi_cs_rt_short axis · oblique · 8.0mm · 1.73mm/px · 1 of 49 slices shown (9 of 14)]
[im 1/49]
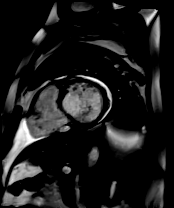

[Series 40: cine_trufi_cs_rt_short axis · oblique · 8.0mm · 1.73mm/px · 1 of 49 slices shown (10 of 14)]
[im 1/49]
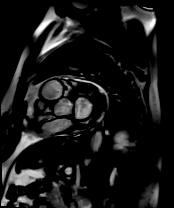

[Series 40: cine_trufi_cs_rt_short axis · oblique · 8.0mm · 1.73mm/px · 1 of 49 slices shown (11 of 14)]
[im 1/49]
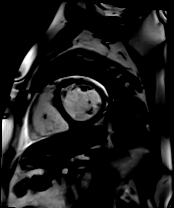

[Series 40: cine_trufi_cs_rt_short axis · oblique · 8.0mm · 1.73mm/px · 1 of 49 slices shown (12 of 14)]
[im 1/49]
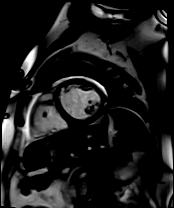

[Series 40: cine_trufi_cs_rt_short axis · oblique · 8.0mm · 1.73mm/px · 1 of 49 slices shown (13 of 14)]
[im 1/49]
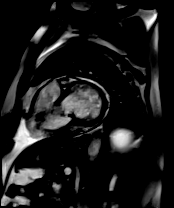

[Series 40: cine_trufi_cs_rt_short axis · oblique · 8.0mm · 1.73mm/px · 1 of 49 slices shown (14 of 14)]
[im 1/49]
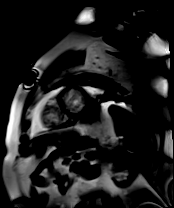

[Series 41: bSSFP · axial · 6.0mm · 1.48mm/px · 1 of 25 slices shown (23 of 23)]
[im 1/25]
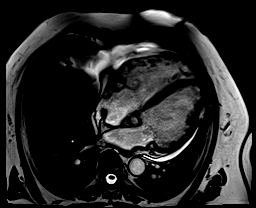

[Series 42: pre short axis · oblique · non-contrast · 8.0mm · 2.25mm/px · 1 of 10 slices shown (1 of 3)]
[im 1/10]
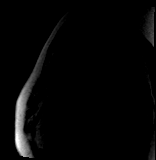

[Series 43: pre short axis · oblique · non-contrast · 8.0mm · 2.25mm/px · 1 of 10 slices shown (2 of 3)]
[im 1/10]
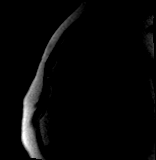

[Series 44: pre short axis · oblique · non-contrast · 8.0mm · 2.25mm/px · 1 of 10 slices shown (3 of 3)]
[im 1/10]
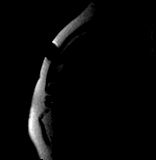

[45 of 48 positions shown; findings below may reference images not displayed]

FINDINGS: Limited images of the lung fields showed no gross abnormalities.

Small pericardial effusion primarily lateral to left ventricle.
Mildly dilated left ventricle with normal wall thickness. Thinned
and akinetic mid inferolateral wall, thinned and akinetic basal to
mid anterior wall. Diffuse hypokinesis in the rest of the ventricle.
Overall LV EF 27%. Moderately dilated right ventricle, RV EF 29%.
Mild left and right atrial enlargement. Mild tricuspid regurgitation
noted. Mild mitral regurgitation noted. No significant aortic
stenosis or regurgitation.

Delayed enhancement imaging:

Mid-wall late gadolinium enhancement (LGE) noted in the basal
anterior and basal anteroseptal wall.

Mid-wall LGE at the inferoseptal RV insertion site.

Diffuse mid-wall LGE in the basal to mid inferolateral wall.

Measurements:

LVEDV 281 mL

LVSV 76 mL
LVEF 27%

RVEDV 277 mL
RVSV 81 mL
RVEF 29%

ECV 36% in septum
IMPRESSION: 1. Mildly dilated LV with wall motion abnormalities as noted above.
LV EF 27%.

2.  Moderately dilated RV with EF 29%.

3. On delayed enhancement imaging, there was mid-wall LGE in the
basal anterior, basal anteroseptal, and basal to mid inferolateral
walls. This is a noncoronary pattern of LGE, suggestive of
infiltrative disease versus myocarditis.

4. Elevated ECV percentage in the septum, not as high as would
expect with amyloidosis.

JAHMI

## 2021-04-11 MED ORDER — GADOBUTROL 1 MMOL/ML IV SOLN
13.0000 mL | Freq: Once | INTRAVENOUS | Status: AC | PRN
  Administered 2021-04-11: 13 mL via INTRAVENOUS

## 2021-04-20 ENCOUNTER — Ambulatory Visit (INDEPENDENT_AMBULATORY_CARE_PROVIDER_SITE_OTHER): Payer: PPO

## 2021-04-20 DIAGNOSIS — I639 Cerebral infarction, unspecified: Secondary | ICD-10-CM | POA: Diagnosis not present

## 2021-04-20 LAB — CUP PACEART REMOTE DEVICE CHECK
Date Time Interrogation Session: 20220525133832
Implantable Pulse Generator Implant Date: 20220420

## 2021-04-25 ENCOUNTER — Encounter (HOSPITAL_COMMUNITY): Payer: Self-pay | Admitting: Cardiology

## 2021-04-25 ENCOUNTER — Other Ambulatory Visit: Payer: Self-pay

## 2021-04-25 ENCOUNTER — Ambulatory Visit (HOSPITAL_COMMUNITY)
Admission: RE | Admit: 2021-04-25 | Discharge: 2021-04-25 | Disposition: A | Discharge status: Home / Self Care | Payer: PPO | Source: Ambulatory Visit | Attending: Cardiology | Admitting: Cardiology

## 2021-04-25 VITALS — BP 110/70 | HR 56 | Wt 209.2 lb

## 2021-04-25 DIAGNOSIS — Z79899 Other long term (current) drug therapy: Secondary | ICD-10-CM | POA: Insufficient documentation

## 2021-04-25 DIAGNOSIS — I5022 Chronic systolic (congestive) heart failure: Secondary | ICD-10-CM | POA: Insufficient documentation

## 2021-04-25 DIAGNOSIS — I428 Other cardiomyopathies: Secondary | ICD-10-CM | POA: Insufficient documentation

## 2021-04-25 DIAGNOSIS — Z8249 Family history of ischemic heart disease and other diseases of the circulatory system: Secondary | ICD-10-CM | POA: Insufficient documentation

## 2021-04-25 DIAGNOSIS — I110 Hypertensive heart disease with heart failure: Secondary | ICD-10-CM | POA: Diagnosis not present

## 2021-04-25 DIAGNOSIS — I2510 Atherosclerotic heart disease of native coronary artery without angina pectoris: Secondary | ICD-10-CM | POA: Diagnosis not present

## 2021-04-25 DIAGNOSIS — Z7984 Long term (current) use of oral hypoglycemic drugs: Secondary | ICD-10-CM | POA: Insufficient documentation

## 2021-04-25 DIAGNOSIS — E785 Hyperlipidemia, unspecified: Secondary | ICD-10-CM | POA: Diagnosis not present

## 2021-04-25 DIAGNOSIS — Z8673 Personal history of transient ischemic attack (TIA), and cerebral infarction without residual deficits: Secondary | ICD-10-CM | POA: Insufficient documentation

## 2021-04-25 DIAGNOSIS — Z7982 Long term (current) use of aspirin: Secondary | ICD-10-CM | POA: Diagnosis not present

## 2021-04-25 LAB — BASIC METABOLIC PANEL
Anion gap: 8 (ref 5–15)
BUN: 27 mg/dL — ABNORMAL HIGH (ref 8–23)
CO2: 27 mmol/L (ref 22–32)
Calcium: 9.4 mg/dL (ref 8.9–10.3)
Chloride: 102 mmol/L (ref 98–111)
Creatinine, Ser: 1.54 mg/dL — ABNORMAL HIGH (ref 0.61–1.24)
GFR, Estimated: 45 mL/min — ABNORMAL LOW (ref >60)
Glucose, Bld: 111 mg/dL — ABNORMAL HIGH (ref 70–99)
Potassium: 4.8 mmol/L (ref 3.5–5.1)
Sodium: 137 mmol/L (ref 135–145)

## 2021-04-25 MED ORDER — SPIRONOLACTONE 25 MG PO TABS: 25.0000 mg | ORAL_TABLET | Freq: Every day | ORAL | 3 refills | Status: DC

## 2021-04-25 NOTE — Progress Notes (Signed)
Medication Samples have been provided to the patient.  Drug name: Jardiance       Strength: 10mg         Qty: 8  LOT: [id number]  Exp.Date: 07/2022  Dosing instructions: take 1 tablet by mouth daily.   The patient has been instructed regarding the correct time, dose, and frequency of taking this medication, including desired effects and most common side effects.   Akshar Starnes R Arlanda Shiplett 1:77 PM 04/25/2021

## 2021-04-25 NOTE — Patient Instructions (Addendum)
EKG done today.  Labs done today. We will contact you only if your labs are abnormal.  INCREASE Spironolactone to 25mg  (1 tablet) by mouth daily.   No other medication changes were made. Please continue all current medications as prescribed.  Samples of jardiance were provided to you during your office visit. Our pharmacy team will be in contact with you regarding patient assistance.   Your physician recommends that you schedule a follow-up appointment in: 10 days at the [place] office for a lab only appointment and in 3 months with Dr. [name].  If you have any questions or concerns before your next appointment please send [place] a message through [place] or call our office at [phone].    TO LEAVE A MESSAGE FOR THE NURSE SELECT OPTION 2, PLEASE LEAVE A MESSAGE INCLUDING: . YOUR NAME . DATE OF BIRTH . CALL BACK NUMBER . REASON FOR CALL**this is important as we prioritize the call backs  YOU WILL RECEIVE A CALL BACK THE SAME DAY AS LONG AS YOU CALL BEFORE 4:00 PM   Do the following things EVERYDAY: 1) Weigh yourself in the morning before breakfast. Write it down and keep it in a log. 2) Take your medicines as prescribed 3) Eat low salt foods--Limit salt (sodium) to 2000 mg per day.  4) Stay as active as you can everyday 5) Limit all fluids for the day to less than 2 liters   At the [facility], you and your health needs are our priority. As part of our continuing mission to provide you with exceptional heart care, we have created designated Provider Care Teams. These Care Teams include your primary Cardiologist (physician) and Advanced Practice Providers (APPs- Physician Assistants and Nurse Practitioners) who all work together to provide you with the care you need, when you need it.   You may see any of the following providers on your designated Care Team at your next follow up: Marland Kitchen Dr Glori Bickers . Dr Loralie Champagne . Darrick Grinder, NP . Lyda Jester,  PA . Audry Riles, PharmD   Please be sure to bring in all your medications bottles to every appointment.

## 2021-04-26 NOTE — Progress Notes (Signed)
PCP: [name], MD Cardiology: Dr. Garen Lah HF Cardiology: Dr. [name]  80 y.o. with history of CVA in 12/21 and nonischemic cardiomyopathy was referred for evaluation of CHF.  Beginning in 11/21, patient began to develop exertional dyspnea as well as bendopnea.  He had a syncopal episode in 12/21 at [facility] and was admitted.  He was found to have a left MCA CVA.  Echo showed EF 30-35%, TEE showed a negative bubble study.  LHC/RHC in 2/22 showed nonobstructive CAD with CI 2.1.  He was volume overloaded and was started on Lasix 40 mg daily.   Cardiac MRI in 5/22 showed LV EF 27%, mild LV dilation, normal wall thickness, RV EF 29%, moderate RV dilation, mid-wall LGE possibly suggestive of prior myocarditis.  He had ILR implanted to screen for atrial fibrillation post-CVA.   Patient returns for followup of CHF.  Weight is stable.  Lightheaded only when he bends down and stands up.  No dyspnea walking on flat ground.  No dyspnea walking up stairs.  No orthopnea/PND.  No palpitations.     ECG (personally reviewed): NSR, 1st degree AVB, PVCs  Labs (12/21): LDL 75 Labs (2/22): K 4.3, creatinine 1.29 Labs (3/22): Transferrin saturation normal, ferritin normal Labs (4/22): K 4.4 => 4.6, creatinine 1.44 => 1.54  PMH:  1. CVA: Left MCA CVA in 12/21.  - TEE did not show PFO.  - 2 wk Zio monitor in 1/22 did not show atrial fibrillation but 11% PVCs.  - ILR (LINQ) placed 2. HTN 3. BPH 4. GERD 5. Hyperlipidemia 6. Chronic systolic CHF: Nonischemic cardiomyopathy.   - Echo (12/21): EF 30-35%, mildly decreased RV systolic function.  - TEE (1/22): EF 30-35%, negative bubble study, mildly decreased RV systolic function.  - LHC/RHC (2/22): Nonobstructive CAD; mean RA 14 mmHg, PA 60/23 mean 35, mean PCWP 21 mmHg, CI 2.1, PVR 3.2 - Cardiac MRI (5/22): LV EF 27%, mild LV dilation, normal wall thickness, RV EF 29%, moderate RV dilation, mid-wall LGE possibly suggestive of prior myocarditis.   7. PVCs: Zio patch 1/22 showed 11% PVCs  SH: Lives with wife in [place], retired, occasional ETOH (not heavy), remote smoker.   FH: Brother died at 41, sounds like SCD.  Nephew died at 41, sounds like SCD.  Mother with MI in her 21s.   ROS: All systems reviewed and negative except as per HPI.   Current Outpatient Medications  Medication Sig Dispense Refill  . acetaminophen (TYLENOL) 500 MG tablet Take 500 mg by mouth every 6 (six) hours as needed.    Marland Kitchen allopurinol (ZYLOPRIM) 100 MG tablet Take 100 mg by mouth daily.    Marland Kitchen aspirin EC 81 MG EC tablet Take 1 tablet (81 mg total) by mouth daily. Swallow whole. 30 tablet 0  . atorvastatin (LIPITOR) 40 MG tablet Take 1 tablet (40 mg total) by mouth daily at 6 PM. 30 tablet 0  . empagliflozin (JARDIANCE) 10 MG TABS tablet Take 1 tablet (10 mg total) by mouth daily before breakfast. 30 tablet 11  . famotidine (PEPCID) 20 MG tablet Take 20 mg by mouth daily.    . furosemide (LASIX) 20 MG tablet Take 1 tablet (20 mg total) by mouth every other day. 30 tablet 1  . gabapentin (NEURONTIN) 100 MG capsule Take 100 mg by mouth 2 (two) times daily.    . metoprolol succinate (TOPROL-XL) 25 MG 24 hr tablet Take 1 tablet (25 mg total) by mouth at bedtime. 90 tablet 3  . sacubitril-valsartan (  ENTRESTO) 24-26 MG Take 1 tablet by mouth 2 (two) times daily. 60 tablet 0  . spironolactone (ALDACTONE) 25 MG tablet Take 1 tablet (25 mg total) by mouth daily. 90 tablet 3   No current facility-administered medications for this encounter.   BP 110/70   Pulse (!) 56   Wt 94.9 kg (209 lb 3.2 oz)   SpO2 96%   BMI 33.77 kg/m  General: NAD Neck: No JVD, no thyromegaly or thyroid nodule.  Lungs: Clear to auscultation bilaterally with normal respiratory effort. CV: Nondisplaced PMI.  Heart regular S1/S2, no S3/S4, no murmur.  No peripheral edema.  No carotid bruit.  Normal pedal pulses.  Abdomen: Soft, nontender, no hepatosplenomegaly, no distention.  Skin: Intact  without lesions or rashes.  Neurologic: Alert and oriented x 3.  Psych: Normal affect. Extremities: No clubbing or cyanosis.  HEENT: Normal.   Assessment/Plan: 1. Chronic systolic CHF: Nonischemic cardiomyopathy.  Echo in 12/21 with EF 30-35%, mildly decreased RV systolic function.  LHC in 2/22 showed mild nonobstructive CAD.  Cause of cardiomyopathy is uncertain.  Prior myocarditis is possible. Cardiac MRI in 5/22 showed LV EF 27%, mild LV dilation, normal wall thickness, RV EF 29%, moderate RV dilation, mid-wall LGE possibly suggestive of prior myocarditis.   His brother and nephew had sudden death, I am uncertain if this was due to a cardiomyopathy or a myocardial infarction (could there be a familial cardiomyopathy?).  He does not drink heavily.  Fe studies negative.  Zio patch in 1/22 showed 11% PVCs, so PVCs may play a role. NYHA class II symptoms, not volume overloaded on exam.  - Continue Toprol XL 25 mg daily.  - Continue Entresto 24/26 bid.   - I will increase spironolactone to 25 mg daily.  BMET today and in 10 days.  - Continue Jardiance 10 mg daily.  - Continue current Lasix.  2. H/o CVA: 12/21.  Cause is uncertain. TEE showed no PFO.  2 wk monitor showed no AF.  TEE and echo did not show LV thrombus.  I remain concerned that atrial fibrillation underlay his CVA, however this has not been seen. His cardiomyopathy does put him at risk for AF. He now has a LINQ monitor.  - Continue ASA 81 and atorvastatin.  - LINQ monitor to be followed by EP.  3. PVCs: 11% on 1/22 Zio patch.  May contribute to his cardiomyopathy.    Followup 3 months.  Loralie Champagne 04/26/2021

## 2021-05-01 ENCOUNTER — Telehealth (HOSPITAL_COMMUNITY): Payer: Self-pay | Admitting: Licensed Clinical Social Worker

## 2021-05-01 NOTE — Telephone Encounter (Signed)
CSW called pt to check if he had heard from [facility] regarding his Extra Help application.  Pt reports he was denied for assistance.  CSW informed him he needs to bring that letter to the clinic so we can turn it in to [facility] to help him get his Jardiance for free through their patient assistance program.  Pt expressed understanding and will plan to bring in letter to clinic addressed to our patient advocate.  Will continue to follow and assist as needed  Jorge Ny, [facility] Desk#: [phone] Cell#: [phone]

## 2021-05-03 ENCOUNTER — Other Ambulatory Visit
Admission: RE | Admit: 2021-05-03 | Discharge: 2021-05-03 | Disposition: A | Discharge status: Home / Self Care | Payer: PPO | Attending: Cardiology | Admitting: Cardiology

## 2021-05-03 DIAGNOSIS — I5022 Chronic systolic (congestive) heart failure: Secondary | ICD-10-CM | POA: Diagnosis not present

## 2021-05-03 LAB — BASIC METABOLIC PANEL
Anion gap: 9 (ref 5–15)
BUN: 30 mg/dL — ABNORMAL HIGH (ref 8–23)
CO2: 27 mmol/L (ref 22–32)
Calcium: 9.3 mg/dL (ref 8.9–10.3)
Chloride: 103 mmol/L (ref 98–111)
Creatinine, Ser: 1.4 mg/dL — ABNORMAL HIGH (ref 0.61–1.24)
GFR, Estimated: 51 mL/min — ABNORMAL LOW (ref >60)
Glucose, Bld: 108 mg/dL — ABNORMAL HIGH (ref 70–99)
Potassium: 4.4 mmol/L (ref 3.5–5.1)
Sodium: 139 mmol/L (ref 135–145)

## 2021-05-04 ENCOUNTER — Ambulatory Visit (INDEPENDENT_AMBULATORY_CARE_PROVIDER_SITE_OTHER): Payer: PPO | Admitting: Cardiology

## 2021-05-04 ENCOUNTER — Other Ambulatory Visit: Payer: Self-pay

## 2021-05-04 ENCOUNTER — Encounter: Payer: Self-pay | Admitting: Cardiology

## 2021-05-04 VITALS — BP 118/78 | HR 56 | Ht 66.0 in | Wt 209.1 lb

## 2021-05-04 DIAGNOSIS — I428 Other cardiomyopathies: Secondary | ICD-10-CM | POA: Diagnosis not present

## 2021-05-04 DIAGNOSIS — I639 Cerebral infarction, unspecified: Secondary | ICD-10-CM

## 2021-05-04 NOTE — Progress Notes (Signed)
Cardiology Office Note:    Date:  05/04/2021   ID:  Gabriel Martin, DOB 13-Oct-1941, MRN [id number]  PCP:  [name], MD  [facility] Cardiologist:  None  [facility] Electrophysiologist:  None   Referring MD: [name], MD   Chief Complaint  Patient presents with   Other    3 month f/u no complaints today. Meds reviewed verbally with pt.     History of Present Illness:    Gabriel Martin is a 80 y.o. male with a hx of hypertension, HFrEF, EF 30 to 35%, CVA 10/2019 who presents for follow-up.    Being seen due to nonischemic cardiomyopathy.  Medications are being titrated.  Follow-up with advanced heart failure clinic, Toprol and Aldactone were increased.  Jardiance started.  Feels okay, has no complaints or concerns at this time.  Tolerating current medications without any adverse effects.  Has some shortness of breath when he overexerts himself.  Denies edema.  States having a co-pay at [facility] but has none at advanced heart failure clinic in [place].  Also has problems with cost for [facility] stating cost is too high.    Prior notes  Patient presented to the ED on 12/8 due to confusion, lightheadedness and loss consciousness, falling to the ground and hitting head.  In the ED, work-up with an MRI showed 2 acute left MCA territory cortical infarcts.  He was started on aspirin, Plavix, Lipitor.  Echocardiogram showed moderate to severely reduced ejection fraction, EF 30 to 35%, global hypokinesis.    Left heart cath 01/10/2021 mild nonobstructive CAD. CMR 03/2021 EF 27%.  Mid wall LGE.  Past Medical History:  Diagnosis Date   Acid reflux 05/12/2015   Anemia    Arthritis    finger thumb   Benign enlargement of prostate    Benign neoplasm of large bowel    Benign prostatic hyperplasia with urinary obstruction 05/12/2015   BPH (benign prostatic hyperplasia)    BPH with obstruction/lower urinary tract symptoms 05/12/2015   Change in blood platelet  count 05/12/2015   CHF (congestive heart failure) (HCC)    Chronic kidney disease 05/12/2015   GERD (gastroesophageal reflux disease)    HLD (hyperlipidemia) 05/12/2015   Hyperlipidemia    Nocturia associated with benign prostatic hypertrophy    Penile ulcer 05/12/2015   Skin cancer    Stroke ([place])    Thrombocytopenia [facility])     Past Surgical History:  Procedure Laterality Date   CATARACT EXTRACTION W/PHACO Left 02/29/2016   Procedure: CATARACT EXTRACTION PHACO AND INTRAOCULAR LENS PLACEMENT ([place]) left;  Surgeon: Leandrew Koyanagi, MD;  Location: [place];  Service: Ophthalmology;  Laterality: Left;   CATARACT EXTRACTION W/PHACO Right 04/25/2016   Procedure: CATARACT EXTRACTION PHACO AND INTRAOCULAR LENS PLACEMENT (IOC);  Surgeon: Leandrew Koyanagi, MD;  Location: [place];  Service: Ophthalmology;  Laterality: Right;   COLONOSCOPY WITH PROPOFOL N/A 07/01/2017   Procedure: COLONOSCOPY WITH PROPOFOL;  Surgeon: Manya Silvas, MD;  Location: [facility] ENDOSCOPY;  Service: Endoscopy;  Laterality: N/A;   EYE SURGERY     HEMORRHOID SURGERY     RIGHT/LEFT HEART CATH AND CORONARY ANGIOGRAPHY Bilateral 01/10/2021   Procedure: RIGHT/LEFT HEART CATH AND CORONARY ANGIOGRAPHY;  Surgeon: Nelva Bush, MD;  Location: [place] CV LAB;  Service: Cardiovascular;  Laterality: Bilateral;   TEE WITHOUT CARDIOVERSION N/A 11/30/2020   Procedure: TRANSESOPHAGEAL ECHOCARDIOGRAM (TEE);  Surgeon: Kate Sable, MD;  Location: [facility] ORS;  Service: Cardiovascular;  Laterality: N/A;    Current  Medications: Current Meds  Medication Sig   acetaminophen (TYLENOL) 500 MG tablet Take 500 mg by mouth every 6 (six) hours as needed.   allopurinol (ZYLOPRIM) 100 MG tablet Take 100 mg by mouth daily.   aspirin EC 81 MG EC tablet Take 1 tablet (81 mg total) by mouth daily. Swallow whole.   atorvastatin (LIPITOR) 40 MG tablet Take 1 tablet (40 mg total) by mouth daily at 6 PM.   empagliflozin  (JARDIANCE) 10 MG TABS tablet Take 1 tablet (10 mg total) by mouth daily before breakfast.   famotidine (PEPCID) 20 MG tablet Take 20 mg by mouth daily.   furosemide (LASIX) 20 MG tablet Take 1 tablet (20 mg total) by mouth every other day.   gabapentin (NEURONTIN) 100 MG capsule Take 100 mg by mouth 2 (two) times daily.   metoprolol succinate (TOPROL-XL) 25 MG 24 hr tablet Take 1 tablet (25 mg total) by mouth at bedtime.   sacubitril-valsartan (ENTRESTO) 24-26 MG Take 1 tablet by mouth 2 (two) times daily.   spironolactone (ALDACTONE) 25 MG tablet Take 1 tablet (25 mg total) by mouth daily.     Allergies:   Patient has no known allergies.   Social History   Socioeconomic History   Marital status: Married    Spouse name: Not on file   Number of children: Not on file   Years of education: Not on file   Highest education level: Not on file  Occupational History   Not on file  Tobacco Use   Smoking status: Former    Pack years: 0.00    Types: Cigarettes    Quit date: 11/26/1978    Years since quitting: 42.4   Smokeless tobacco: Never   Tobacco comments:    quit 40 years ago  Vaping Use   Vaping Use: Never used  Substance and Sexual Activity   Alcohol use: Yes    Alcohol/week: 6.0 standard drinks    Types: 6 Cans of beer per week    Comment: occasional   Drug use: No   Sexual activity: Never  Other Topics Concern   Not on file  Social History Narrative   Not on file   Social Determinants of Health   Financial Resource Strain: Not on file  Food Insecurity: Not on file  Transportation Needs: Not on file  Physical Activity: Not on file  Stress: Not on file  Social Connections: Not on file     Family History: The patient's family history includes Heart attack in his mother; Heart attack (age of onset: 21) in his nephew; Heart attack (age of onset: 99) in his brother; Heart disease in his brother.  ROS:   Please see the history of present illness.     All other systems  reviewed and are negative.  EKGs/Labs/Other Studies Reviewed:    The following studies were reviewed today:   EKG:  EKG not ordered today.   Recent Labs: 11/03/2020: ALT 24; Magnesium 2.2 02/21/2021: B Natriuretic Peptide 376.4; Hemoglobin 13.8; Platelets PLATELET CLUMPS NOTED ON SMEAR, UNABLE TO ESTIMATE 05/03/2021: BUN 30; Creatinine, Ser 1.40; Potassium 4.4; Sodium 139  Recent Lipid Panel    Component Value Date/Time   CHOL 137 11/04/2020 0526   TRIG 68 11/04/2020 0526   HDL 48 11/04/2020 0526   CHOLHDL 2.9 11/04/2020 0526   VLDL 14 11/04/2020 0526   LDLCALC 75 11/04/2020 0526     Risk Assessment/Calculations:      Physical Exam:    VS:  BP 118/78 (BP Location: Left Arm, Patient Position: Sitting, Cuff Size: Normal)   Pulse (!) 56   Ht 5\' 6"  (1.676 m)   Wt 209 lb 2 oz (94.9 kg)   SpO2 96%   BMI 33.75 kg/m     Wt Readings from Last 3 Encounters:  05/04/21 209 lb 2 oz (94.9 kg)  04/25/21 209 lb 3.2 oz (94.9 kg)  03/15/21 208 lb 6 oz (94.5 kg)     GEN:  Well nourished, well developed in no acute distress HEENT: Normal NECK: No JVD; No carotid bruits LYMPHATICS: No lymphadenopathy CARDIAC: RRR, no murmurs, rubs, gallops RESPIRATORY:  Clear to auscultation without rales, wheezing or rhonchi  ABDOMEN: Soft, non-tender, non-distended MUSCULOSKELETAL:  No edema; No deformity  SKIN: Warm and dry NEUROLOGIC:  Alert and oriented x 3 PSYCHIATRIC:  Normal affect   ASSESSMENT:    1. Cerebrovascular accident (CVA), unspecified mechanism ([place])   2. NICM (nonischemic cardiomyopathy) ([place])     PLAN:    In order of problems listed above:  Hx of CVA 10/2020, 2 left MCA cortical territory.  Prior Cardiac monitor with no evidence for A. fib or flutter.  TEE with no thrombus or intra-atrial shunt.  Cont aspirin, Lipitor. F/u with neuro. Has an ILR in place to eval possible afib. Keep f/u with EP. Nonischemic cardiomyopathy, EF 30 to 35%.  Left heart cath 12/2020 with mild,  non obstructive CAD.  NYHA class II symptoms.  Continue Toprol-XL 25 mg daily, Entresto 24-26 mg daily, aldactone 25mg  qd, jardiance 10mg  qd, Lasix 20 mg every other day.  Appreciate input from advanced heart failure clinic.  Low normal blood pressures preventing addition or titration of CHF meds at this time.  We will see if patient qualifies for patient assistance regarding Entresto cost.  Plan for repeat echocardiogram in 3 months.  Follow-up in 3 months after repeat echo  Total encounter time more than 40 minutes  Greater than 50% was spent in counseling and coordination of care with the patient    Medication Adjustments/Labs and Tests Ordered: Current medicines are reviewed at length with the patient today.  Concerns regarding medicines are outlined above.  Orders Placed This Encounter  Procedures   ECHOCARDIOGRAM COMPLETE    No orders of the defined types were placed in this encounter.   Patient Instructions  Medication Instructions:  Your physician recommends that you continue on your current medications as directed. Please refer to the Current Medication list given to you today.  *If you need a refill on your cardiac medications before your next appointment, please call your pharmacy*   Lab Work: None ordered If you have labs (blood work) drawn today and your tests are completely normal, you will receive your results only by: [place] (if you have MyChart) OR A paper copy in the mail If you have any lab test that is abnormal or we need to change your treatment, we will call you to review the results.   Testing/Procedures:  Your physician has requested that you have an echocardiogram in 3-4 months. Echocardiography is a painless test that uses sound waves to create images of your heart. It provides your doctor with information about the size and shape of your heart and how well your heart's chambers and valves are working. This procedure takes approximately one hour.  There are no restrictions for this procedure.    Follow-Up: At [facility], you and your health needs are our priority.  As part of our continuing  mission to provide you with exceptional heart care, we have created designated Provider Care Teams.  These Care Teams include your primary Cardiologist (physician) and Advanced Practice Providers (APPs -  Physician Assistants and Nurse Practitioners) who all work together to provide you with the care you need, when you need it.  We recommend signing up for the patient portal called "MyChart".  Sign up information is provided on this After Visit Summary.  MyChart is used to connect with patients for Virtual Visits (Telemedicine).  Patients are able to view lab/test results, encounter notes, upcoming appointments, etc.  Non-urgent messages can be sent to your provider as well.   To learn more about what you can do with MyChart, go to [email or web address].    Your next appointment:   Follow up after repeat echo in 3-4 months   The format for your next appointment:   In Person  Provider:   Kate Sable, MD   Other Instructions    Signed, Kate Sable, MD  05/04/2021 9:39 AM    [place]

## 2021-05-04 NOTE — Patient Instructions (Signed)
Medication Instructions:  Your physician recommends that you continue on your current medications as directed. Please refer to the Current Medication list given to you today.  *If you need a refill on your cardiac medications before your next appointment, please call your pharmacy*   Lab Work: None ordered If you have labs (blood work) drawn today and your tests are completely normal, you will receive your results only by: [place] (if you have MyChart) OR A paper copy in the mail If you have any lab test that is abnormal or we need to change your treatment, we will call you to review the results.   Testing/Procedures:  Your physician has requested that you have an echocardiogram in 3-4 months. Echocardiography is a painless test that uses sound waves to create images of your heart. It provides your doctor with information about the size and shape of your heart and how well your heart's chambers and valves are working. This procedure takes approximately one hour. There are no restrictions for this procedure.    Follow-Up: At [facility], you and your health needs are our priority.  As part of our continuing mission to provide you with exceptional heart care, we have created designated Provider Care Teams.  These Care Teams include your primary Cardiologist (physician) and Advanced Practice Providers (APPs -  Physician Assistants and Nurse Practitioners) who all work together to provide you with the care you need, when you need it.  We recommend signing up for the patient portal called "MyChart".  Sign up information is provided on this After Visit Summary.  MyChart is used to connect with patients for Virtual Visits (Telemedicine).  Patients are able to view lab/test results, encounter notes, upcoming appointments, etc.  Non-urgent messages can be sent to your provider as well.   To learn more about what you can do with MyChart, go to [email or web address].    Your next  appointment:   Follow up after repeat echo in 3-4 months   The format for your next appointment:   In Person  Provider:   Kate Sable, MD   Other Instructions

## 2021-05-05 ENCOUNTER — Telehealth (HOSPITAL_COMMUNITY): Payer: Self-pay | Admitting: Pharmacy Technician

## 2021-05-05 NOTE — Telephone Encounter (Signed)
Advanced Heart Failure Patient Advocate Encounter  Sent in denial to [facility] Cares.

## 2021-05-10 ENCOUNTER — Other Ambulatory Visit: Payer: Self-pay

## 2021-05-10 MED ORDER — ENTRESTO 24-26 MG PO TABS: 1.0000 | ORAL_TABLET | Freq: Two times a day (BID) | ORAL | 3 refills | Status: DC

## 2021-05-15 NOTE — Progress Notes (Signed)
Carelink Summary Report / Loop Recorder 

## 2021-05-21 ENCOUNTER — Encounter: Payer: Self-pay | Admitting: Urology

## 2021-05-21 NOTE — Progress Notes (Signed)
05/22/2021 8:34 AM   Gabriel Martin [birth year] [id number]  Referring provider: [name], MD [address] [facility],  [place] [address]  Urological history: 1. BPH with LU TS -PSA 0.7 in 2017-aged out of screening -TURP 2014 -I PSS 2/1  Chief Complaint  Patient presents with   Follow-up    1 year follow-up   HPI: Gabriel Martin is an 80 y.o. male who presents today for yearly visit.    He is still experiencing nocturia x 3-5 which he attributes to his medications and other factors.  Patient denies any modifying or aggravating factors.  Patient denies any gross hematuria, dysuria or suprapubic/flank pain.  Patient denies any fevers, chills, nausea or vomiting.       IPSS     Row Name 05/22/21 0800         International Prostate Symptom Score   How often have you had the sensation of not emptying your bladder? Not at All     How often have you had to urinate less than every two hours? Not at All     How often have you found you stopped and started again several times when you urinated? Not at All     How often have you found it difficult to postpone urination? Not at All     How often have you had a weak urinary stream? Not at All     How often have you had to strain to start urination? Not at All     How many times did you typically get up at night to urinate? 2 Times     Total IPSS Score 2           Quality of Life due to urinary symptoms     If you were to spend the rest of your life with your urinary condition just the way it is now how would you feel about that? Pleased              Score:  1-7 Mild 8-19 Moderate 20-35 Severe    PMH: Past Medical History:  Diagnosis Date   Acid reflux 05/12/2015   Anemia    Arthritis    finger thumb   Benign neoplasm of large bowel    Benign prostatic hyperplasia with urinary obstruction 05/12/2015   Change in blood platelet count 05/12/2015   CHF (congestive heart  failure) (HCC)    Chronic kidney disease 05/12/2015   GERD (gastroesophageal reflux disease)    HLD (hyperlipidemia) 05/12/2015   Hyperlipidemia    Nocturia associated with benign prostatic hypertrophy    Penile ulcer 05/12/2015   Skin cancer    Stroke ([place])    Thrombocytopenia [facility])     Surgical History: Past Surgical History:  Procedure Laterality Date   CATARACT EXTRACTION W/PHACO Left 02/29/2016   Procedure: CATARACT EXTRACTION PHACO AND INTRAOCULAR LENS PLACEMENT ([place]) left;  Surgeon: Leandrew Koyanagi, MD;  Location: [place];  Service: Ophthalmology;  Laterality: Left;   CATARACT EXTRACTION W/PHACO Right 04/25/2016   Procedure: CATARACT EXTRACTION PHACO AND INTRAOCULAR LENS PLACEMENT ([facility]);  Surgeon: Leandrew Koyanagi, MD;  Location: [place];  Service: Ophthalmology;  Laterality: Right;   COLONOSCOPY WITH PROPOFOL N/A 07/01/2017   Procedure: COLONOSCOPY WITH PROPOFOL;  Surgeon: Manya Silvas, MD;  Location: [facility] ENDOSCOPY;  Service: Endoscopy;  Laterality: N/A;   EYE SURGERY     HEMORRHOID SURGERY     RIGHT/LEFT HEART CATH AND CORONARY ANGIOGRAPHY  Bilateral 01/10/2021   Procedure: RIGHT/LEFT HEART CATH AND CORONARY ANGIOGRAPHY;  Surgeon: Nelva Bush, MD;  Location: [place] CV LAB;  Service: Cardiovascular;  Laterality: Bilateral;   TEE WITHOUT CARDIOVERSION N/A 11/30/2020   Procedure: TRANSESOPHAGEAL ECHOCARDIOGRAM (TEE);  Surgeon: Kate Sable, MD;  Location: [facility] ORS;  Service: Cardiovascular;  Laterality: N/A;    Home Medications:  Allergies as of 05/22/2021   No Known Allergies      Medication List        Accurate as of May 22, 2021  8:34 AM. If you have any questions, ask your nurse or doctor.          acetaminophen 500 MG tablet Commonly known as: TYLENOL Take 500 mg by mouth every 6 (six) hours as needed.   allopurinol 100 MG tablet Commonly known as: ZYLOPRIM Take 100 mg by mouth daily.   aspirin 81 MG EC  tablet Take 1 tablet (81 mg total) by mouth daily. Swallow whole.   atorvastatin 40 MG tablet Commonly known as: LIPITOR Take 1 tablet (40 mg total) by mouth daily at 6 PM.   empagliflozin 10 MG Tabs tablet Commonly known as: Jardiance Take 1 tablet (10 mg total) by mouth daily before breakfast.   Entresto 24-26 MG Generic drug: sacubitril-valsartan Take 1 tablet by mouth 2 (two) times daily.   famotidine 20 MG tablet Commonly known as: PEPCID Take 20 mg by mouth daily.   furosemide 40 MG tablet Commonly known as: LASIX Take 20 mg by mouth daily. What changed: Another medication with the same name was removed. Continue taking this medication, and follow the directions you see here. Changed by: Zara Council, PA-C   gabapentin 100 MG capsule Commonly known as: NEURONTIN Take 100 mg by mouth 2 (two) times daily.   metoprolol succinate 25 MG 24 hr tablet Commonly known as: TOPROL-XL Take 1 tablet (25 mg total) by mouth at bedtime.   spironolactone 25 MG tablet Commonly known as: ALDACTONE Take 1 tablet (25 mg total) by mouth daily.        Allergies: No Known Allergies  Family History: Family History  Problem Relation Age of Onset   Heart disease Brother    Heart attack Brother 95   Heart attack Mother    Heart attack Nephew 54    Social History:  reports that he quit smoking about 42 years ago. His smoking use included cigarettes. He has never used smokeless tobacco. He reports current alcohol use of about 6.0 standard drinks of alcohol per week. He reports that he does not use drugs.  ROS: For pertinent review of systems please refer to history of present illness  Physical Exam: BP 110/60   Pulse 72   Ht 5\' 6"  (1.676 m)   Wt 210 lb (95.3 kg)   BMI 33.89 kg/m   Constitutional:  Well nourished. Alert and oriented, No acute distress. HEENT: [place] AT, mask in place.  Trachea midline Cardiovascular: No clubbing, cyanosis, or edema. Respiratory: Normal  respiratory effort, no increased work of breathing. GU: No CVA tenderness.  No bladder fullness or masses.  Patient with uncircumcised phallus.   Foreskin easily retracted.   Urethral meatus is patent.  No penile discharge. No penile lesions or rashes. Scrotum without lesions, cysts, rashes and/or edema.  Testicles are located scrotally bilaterally. No masses are appreciated in the testicles. Left and right epididymis are normal. Rectal: Patient with  normal sphincter tone. Anus and perineum without scarring or rashes. No rectal masses are appreciated.  Prostate is approximately 60 + grams, could only palpate the apex, no nodules are appreciated. Seminal vesicles could not be palpated Neurologic: Grossly intact, no focal deficits, moving all 4 extremities. Psychiatric: Normal mood and affect.   Laboratory Data: Component     Latest Ref Rng & Units 05/03/2021  Sodium     135 - 145 mmol/L 139  Potassium     3.5 - 5.1 mmol/L 4.4  Chloride     98 - 111 mmol/L 103  CO2     22 - 32 mmol/L 27  Glucose     70 - 99 mg/dL 108 (H)  BUN     8 - 23 mg/dL 30 (H)  Creatinine     0.61 - 1.24 mg/dL 1.40 (H)  Calcium     8.9 - 10.3 mg/dL 9.3  GFR, Estimated     >60 mL/min 51 (L)  Anion gap     5 - 15 9   Component     Latest Ref Rng & Units 02/21/2021  WBC     4.0 - 10.5 K/uL 5.6  RBC     4.22 - 5.81 MIL/uL 4.28  Hemoglobin     13.0 - 17.0 g/dL 13.8  HCT     39.0 - 52.0 % 42.7  MCV     80.0 - 100.0 fL 99.8  MCH     26.0 - 34.0 pg 32.2  MCHC     30.0 - 36.0 g/dL 32.3  RDW     11.5 - 15.5 % 12.6  Platelets     150 - 400 K/uL PLATELET CLUMPS NOTED ON SMEAR, UNABLE TO ESTIMATE  nRBC     0.0 - 0.2 % 0.0   Uric Acid 4.4 - 7.6 mg/dL 10.5 High    Resulting Agency  [place] - LAB  Specimen Collected: 01/30/21 08:36 Last Resulted: 01/30/21 14:22  Received From: [place]  Result Received: 02/21/21 15:15   Thyroid Stimulating Hormone (TSH) 0.450-5.330 uIU/ml  uIU/mL 2.441   Resulting Agency  [place] - LAB  Specimen Collected: 01/30/21 08:36 Last Resulted: 01/30/21 10:40  Received From: [place]  Result Received: 02/21/21 15:15   Color Yellow, Violet, Light Violet, Dark Violet Yellow   Clarity Clear Clear   Specific Gravity 1.000 - 1.030 1.020   pH, Urine 5.0 - 8.0 6.0   Protein, Urinalysis Negative, Trace mg/dL Negative   Glucose, Urinalysis Negative mg/dL Negative   Ketones, Urinalysis Negative mg/dL Negative   Blood, Urinalysis Negative Negative   Nitrite, Urinalysis Negative Negative   Leukocyte Esterase, Urinalysis Negative Negative   White Blood Cells, Urinalysis None Seen, 0-3 /hpf None Seen   Red Blood Cells, Urinalysis None Seen, 0-3 /hpf None Seen   Bacteria, Urinalysis None Seen /hpf None Seen   Squamous Epithelial Cells, Urinalysis Rare, Few, None Seen /hpf None Seen   Resulting Agency  [place] - LAB  Specimen Collected: 01/30/21 08:36 Last Resulted: 01/30/21 09:27  Received From: [place]  Result Received: 02/21/21 15:15   Cholesterol, Total 100 - 200 mg/dL 144   Triglyceride 35 - 199 mg/dL 75   HDL (High Density Lipoprotein) Cholesterol 29.0 - 71.0 mg/dL 47.4   LDL Calculated 0 - 130 mg/dL 82   VLDL Cholesterol mg/dL 15   Cholesterol/HDL Ratio  3.0   Resulting Agency  [place] - LAB  Specimen Collected: 01/30/21 08:36 Last Resulted: 01/30/21 14:21  Received From: [facility]  [place]  Result Received: 02/21/21 15:15  I have reviewed the labs.  Pertinent Imaging No recent imaging    Assessment & Plan:    1. BPH with LUTS -symptoms stable  2. Nocturia -symptoms stable   Return if symptoms worsen or fail to improve.  These notes generated with voice recognition software. I apologize for typographical errors.  Zara Council, PA-C  [facility] [address] [place], [place]  [address] [phone]

## 2021-05-22 ENCOUNTER — Ambulatory Visit (INDEPENDENT_AMBULATORY_CARE_PROVIDER_SITE_OTHER): Payer: PPO | Admitting: Urology

## 2021-05-22 ENCOUNTER — Encounter: Payer: Self-pay | Admitting: Urology

## 2021-05-22 ENCOUNTER — Other Ambulatory Visit: Payer: Self-pay

## 2021-05-22 VITALS — BP 110/60 | HR 72 | Ht 66.0 in | Wt 210.0 lb

## 2021-05-22 DIAGNOSIS — N401 Enlarged prostate with lower urinary tract symptoms: Secondary | ICD-10-CM

## 2021-05-22 DIAGNOSIS — N138 Other obstructive and reflux uropathy: Secondary | ICD-10-CM

## 2021-05-22 DIAGNOSIS — R351 Nocturia: Secondary | ICD-10-CM | POA: Diagnosis not present

## 2021-05-23 ENCOUNTER — Ambulatory Visit (INDEPENDENT_AMBULATORY_CARE_PROVIDER_SITE_OTHER): Payer: PPO

## 2021-05-23 DIAGNOSIS — I639 Cerebral infarction, unspecified: Secondary | ICD-10-CM

## 2021-05-23 LAB — CUP PACEART REMOTE DEVICE CHECK
Date Time Interrogation Session: 20220627133912
Implantable Pulse Generator Implant Date: 20220420

## 2021-06-12 ENCOUNTER — Telehealth: Payer: Self-pay | Admitting: Cardiology

## 2021-06-12 NOTE — Telephone Encounter (Signed)
[facility] calling to discuss PA for tier exception for entresto.

## 2021-06-12 NOTE — Telephone Encounter (Signed)
Patient would like to know if there is an alternative for Entresto. States he is unable to afford this medication. Please call to discuss.

## 2021-06-12 NOTE — Telephone Encounter (Signed)
Spoke with patient and informed him that our office spoke with [employer] and they are trying to get his copay down. See previous tele encounter. They informed us it would be 72 hours before a decision is made. Patient is running low on medication so I informed him that I will leave samples at the front desk to get him through. Patient was extremely grateful.  Samples of this drug (Entresto 24-26 MG) were given to the patient, quantity 1 bottle, Lot Number [id number]

## 2021-06-12 NOTE — Telephone Encounter (Signed)
Spoke with Beckie Busing at [place] to answer clinical question RE: tier exception for [facility]. She states our office will receive a determination within 72 hours.

## 2021-06-12 NOTE — Progress Notes (Signed)
Carelink Summary Report / Loop Recorder 

## 2021-06-13 NOTE — Telephone Encounter (Signed)
Entresto 24-26mg  tablet tier exception has been denied.   Per fax received from [place], this medication is not eligible for a tier exception 9lower cost) because it is already at the lowest possible tier. It currently resides at a Tier 3 (Preferred Brand). Tier exceptions can only be approved if the requested drug is not already on the lowest cost sharing tier available for the brand/generic status of the drug. The criterion used to make this decision was [employer] CMS approved Tier Exception (TE)-9 Medicare criteria.

## 2021-06-13 NOTE — Telephone Encounter (Signed)
Called patient and left a VM requesting a call back so I can ask him about applying for PAF. I have an application ready to sent to him or leave for him to pick up.

## 2021-06-15 NOTE — Telephone Encounter (Signed)
Called patient on 7/19 and informed him that I left a PAF form for him at the front desk to pick up for [facility]. Patient came in on 06/14/21 and filled out the form. I will be faxing to [employer] today.

## 2021-06-16 NOTE — Telephone Encounter (Signed)
PAF was successfully faxed to [employer] on 06/15/21.

## 2021-06-26 ENCOUNTER — Ambulatory Visit (INDEPENDENT_AMBULATORY_CARE_PROVIDER_SITE_OTHER): Payer: PPO

## 2021-06-26 DIAGNOSIS — I639 Cerebral infarction, unspecified: Secondary | ICD-10-CM

## 2021-06-26 LAB — CUP PACEART REMOTE DEVICE CHECK
Date Time Interrogation Session: 20220730133615
Implantable Pulse Generator Implant Date: 20220420

## 2021-06-28 NOTE — Telephone Encounter (Signed)
[employer] and checked on patient application status, was informed that Gabriel Martin has been approved from 05/25/21 until end of the year, and that patient was sent the first shipment last week.  Called patient to verify that he received his first shipment. He stated that he did and was grateful for the help in getting it.  Pt. is also aware that he will need to re apply at the end of the year.

## 2021-07-04 ENCOUNTER — Other Ambulatory Visit (HOSPITAL_COMMUNITY): Payer: Self-pay | Admitting: Cardiology

## 2021-07-20 NOTE — Progress Notes (Signed)
Carelink Summary Report / Loop Recorder 

## 2021-07-27 ENCOUNTER — Telehealth (HOSPITAL_COMMUNITY): Payer: Self-pay | Admitting: Pharmacy Technician

## 2021-07-27 ENCOUNTER — Other Ambulatory Visit: Payer: Self-pay

## 2021-07-27 ENCOUNTER — Encounter (HOSPITAL_COMMUNITY): Payer: Self-pay | Admitting: Cardiology

## 2021-07-27 ENCOUNTER — Other Ambulatory Visit (HOSPITAL_COMMUNITY): Payer: Self-pay

## 2021-07-27 ENCOUNTER — Ambulatory Visit (HOSPITAL_COMMUNITY)
Admission: RE | Admit: 2021-07-27 | Discharge: 2021-07-27 | Disposition: A | Discharge status: Home / Self Care | Payer: PPO | Source: Ambulatory Visit | Attending: Cardiology | Admitting: Cardiology

## 2021-07-27 VITALS — BP 102/70 | HR 59 | Wt 211.4 lb

## 2021-07-27 DIAGNOSIS — I493 Ventricular premature depolarization: Secondary | ICD-10-CM | POA: Insufficient documentation

## 2021-07-27 DIAGNOSIS — I428 Other cardiomyopathies: Secondary | ICD-10-CM | POA: Insufficient documentation

## 2021-07-27 DIAGNOSIS — Z8249 Family history of ischemic heart disease and other diseases of the circulatory system: Secondary | ICD-10-CM | POA: Diagnosis not present

## 2021-07-27 DIAGNOSIS — Z7982 Long term (current) use of aspirin: Secondary | ICD-10-CM | POA: Insufficient documentation

## 2021-07-27 DIAGNOSIS — Z79899 Other long term (current) drug therapy: Secondary | ICD-10-CM | POA: Insufficient documentation

## 2021-07-27 DIAGNOSIS — I2510 Atherosclerotic heart disease of native coronary artery without angina pectoris: Secondary | ICD-10-CM | POA: Insufficient documentation

## 2021-07-27 DIAGNOSIS — Z8673 Personal history of transient ischemic attack (TIA), and cerebral infarction without residual deficits: Secondary | ICD-10-CM | POA: Insufficient documentation

## 2021-07-27 DIAGNOSIS — I110 Hypertensive heart disease with heart failure: Secondary | ICD-10-CM | POA: Diagnosis not present

## 2021-07-27 DIAGNOSIS — Z7901 Long term (current) use of anticoagulants: Secondary | ICD-10-CM | POA: Diagnosis not present

## 2021-07-27 DIAGNOSIS — I5032 Chronic diastolic (congestive) heart failure: Secondary | ICD-10-CM | POA: Insufficient documentation

## 2021-07-27 DIAGNOSIS — Z09 Encounter for follow-up examination after completed treatment for conditions other than malignant neoplasm: Secondary | ICD-10-CM | POA: Insufficient documentation

## 2021-07-27 LAB — BASIC METABOLIC PANEL
Anion gap: 8 (ref 5–15)
BUN: 26 mg/dL — ABNORMAL HIGH (ref 8–23)
CO2: 24 mmol/L (ref 22–32)
Calcium: 9.4 mg/dL (ref 8.9–10.3)
Chloride: 107 mmol/L (ref 98–111)
Creatinine, Ser: 1.23 mg/dL (ref 0.61–1.24)
GFR, Estimated: 59 mL/min — ABNORMAL LOW (ref >60)
Glucose, Bld: 105 mg/dL — ABNORMAL HIGH (ref 70–99)
Potassium: 4.4 mmol/L (ref 3.5–5.1)
Sodium: 139 mmol/L (ref 135–145)

## 2021-07-27 MED ORDER — METOPROLOL SUCCINATE ER 25 MG PO TB24: 25.0000 mg | ORAL_TABLET | Freq: Two times a day (BID) | ORAL | 3 refills | Status: DC

## 2021-07-27 MED ORDER — SPIRONOLACTONE 25 MG PO TABS: 25.0000 mg | ORAL_TABLET | Freq: Every day | ORAL | 3 refills | Status: DC

## 2021-07-27 MED ORDER — EMPAGLIFLOZIN 10 MG PO TABS: 10.0000 mg | ORAL_TABLET | Freq: Every day | ORAL | 3 refills | Status: DC

## 2021-07-27 NOTE — Patient Instructions (Addendum)
Labs done today. We will contact you only if your labs are abnormal.  INCREASE Metoprolol to '25mg'$  (1 tablet) by mouth 2 times daily.   Start taking your spironolactone at night  RESTART Jardiance '10mg'$  (1 tablet) by mouth daily.   No other medication changes were made. Please continue all current medications as prescribed.  Your physician recommends that you schedule a follow-up appointment in: 3 months  If you have any questions or concerns before your next appointment please send [place] a message through [place] or call our office at [phone].    TO LEAVE A MESSAGE FOR THE NURSE SELECT OPTION 2, PLEASE LEAVE A MESSAGE INCLUDING: YOUR NAME DATE OF BIRTH CALL BACK NUMBER REASON FOR CALL**this is important as we prioritize the call backs  YOU WILL RECEIVE A CALL BACK THE SAME DAY AS LONG AS YOU CALL BEFORE 4:00 PM   Do the following things EVERYDAY: Weigh yourself in the morning before breakfast. Write it down and keep it in a log. Take your medicines as prescribed Eat low salt foods--Limit salt (sodium) to 2000 mg per day.  Stay as active as you can everyday Limit all fluids for the day to less than 2 liters   At the [facility], you and your health needs are our priority. As part of our continuing mission to provide you with exceptional heart care, we have created designated Provider Care Teams. These Care Teams include your primary Cardiologist (physician) and Advanced Practice Providers (APPs- Physician Assistants and Nurse Practitioners) who all work together to provide you with the care you need, when you need it.   You may see any of the following providers on your designated Care Team at your next follow up: Dr Glori Bickers Dr Haynes Kerns, NP Lyda Jester, [place] Audry Riles, PharmD   Please be sure to bring in all your medications bottles to every appointment.

## 2021-07-27 NOTE — Telephone Encounter (Signed)
Advanced Heart Failure Patient Advocate Encounter  Patient was seen in clinic today and started on Jardiance.   The current 30 day co-pay is $114. Was able to obtain a PAN HF grant to help cover the cost.  Member ID: [id number] Group ID: [id number] RxBin ID: [id number] PCN: [id number] Eligibility Start Date: 04/28/2021 Eligibility End Date: 07/26/2022 Assistance Amount: $1,000.00  Patient was given a copy of the grant information to take to the pharmacy.  Charlann Boxer, CPhT

## 2021-07-27 NOTE — Progress Notes (Signed)
PCP: [name], MD Cardiology: Dr. Garen Lah HF Cardiology: Dr. [name]  80 y.o. with history of CVA in 12/21 and nonischemic cardiomyopathy was referred for evaluation of CHF.  Beginning in 11/21, patient began to develop exertional dyspnea as well as bendopnea.  He had a syncopal episode in 12/21 at [facility] and was admitted.  He was found to have a left MCA CVA.  Echo showed EF 30-35%, TEE showed a negative bubble study.  LHC/RHC in 2/22 showed nonobstructive CAD with CI 2.1.  He was volume overloaded and was started on Lasix 40 mg daily.   Cardiac MRI in 5/22 showed LV EF 27%, mild LV dilation, normal wall thickness, RV EF 29%, moderate RV dilation, mid-wall LGE possibly suggestive of prior myocarditis.  He had ILR implanted to screen for atrial fibrillation post-CVA.   Patient returns for followup of CHF.  Weight is stable.  He is off Jardiance as he was unable to afford it (poor coverage).  Occasional lightheadedness if he stands up too fast. No chest pain.  No dyspnea walking on flat ground or up a flight of stairs.  He mows grass with a riding lawnmower.   No atrial fibrillation has been noted on LINQ monitor.    ECG (personally reviewed): NSR, PVCs, 1st degree AVB, IVCD 118 msec  Labs (12/21): LDL 75 Labs (2/22): K 4.3, creatinine 1.29 Labs (3/22): Transferrin saturation normal, ferritin normal Labs (4/22): K 4.4 => 4.6, creatinine 1.44 => 1.54 Labs (6/22): K 4.4, creatinine 1.4  PMH:  1. CVA: Left MCA CVA in 12/21.  - TEE did not show PFO.  - 2 wk Zio monitor in 1/22 did not show atrial fibrillation but 11% PVCs.  - ILR (LINQ) placed 2. HTN 3. BPH 4. GERD 5. Hyperlipidemia 6. Chronic systolic CHF: Nonischemic cardiomyopathy.   - Echo (12/21): EF 30-35%, mildly decreased RV systolic function.  - TEE (1/22): EF 30-35%, negative bubble study, mildly decreased RV systolic function.  - LHC/RHC (2/22): Nonobstructive CAD; mean RA 14 mmHg, PA 60/23 mean 35, mean PCWP  21 mmHg, CI 2.1, PVR 3.2 - Cardiac MRI (5/22): LV EF 27%, mild LV dilation, normal wall thickness, RV EF 29%, moderate RV dilation, mid-wall LGE possibly suggestive of prior myocarditis.  7. PVCs: Zio patch 1/22 showed 11% PVCs  SH: Lives with wife in [place], retired, occasional ETOH (not heavy), remote smoker.   FH: Brother died at 25, sounds like SCD.  Nephew died at 57, sounds like SCD.  Mother with MI in her 40s.   ROS: All systems reviewed and negative except as per HPI.   Current Outpatient Medications  Medication Sig Dispense Refill   acetaminophen (TYLENOL) 500 MG tablet Take 500 mg by mouth every 6 (six) hours as needed.     allopurinol (ZYLOPRIM) 100 MG tablet Take 100 mg by mouth daily.     aspirin EC 81 MG EC tablet Take 1 tablet (81 mg total) by mouth daily. Swallow whole. 30 tablet 0   atorvastatin (LIPITOR) 40 MG tablet Take 1 tablet (40 mg total) by mouth daily at 6 PM. 30 tablet 0   famotidine (PEPCID) 20 MG tablet Take 20 mg by mouth daily.     furosemide (LASIX) 20 MG tablet 20 mg. Patient takes 1 tablet by mouth every other day.     gabapentin (NEURONTIN) 100 MG capsule Take 100 mg by mouth 2 (two) times daily.     sacubitril-valsartan (ENTRESTO) 24-26 MG Take 1 tablet by mouth 2 (  two) times daily. 60 tablet 3   empagliflozin (JARDIANCE) 10 MG TABS tablet Take 1 tablet (10 mg total) by mouth daily before breakfast. 90 tablet 3   metoprolol succinate (TOPROL-XL) 25 MG 24 hr tablet Take 1 tablet (25 mg total) by mouth 2 (two) times daily. 180 tablet 3   spironolactone (ALDACTONE) 25 MG tablet Take 1 tablet (25 mg total) by mouth at bedtime. 90 tablet 3   No current facility-administered medications for this encounter.   BP 102/70   Pulse (!) 59   Wt 95.9 kg (211 lb 6.4 oz)   SpO2 95%   BMI 34.12 kg/m  General: NAD Neck: No JVD, no thyromegaly or thyroid nodule.  Lungs: Clear to auscultation bilaterally with normal respiratory effort. CV: Nondisplaced PMI.   Heart regular S1/S2, no S3/S4, no murmur.  No peripheral edema.  No carotid bruit.  Normal pedal pulses.  Abdomen: Soft, nontender, no hepatosplenomegaly, no distention.  Skin: Intact without lesions or rashes.  Neurologic: Alert and oriented x 3.  Psych: Normal affect. Extremities: No clubbing or cyanosis.  HEENT: Normal.   Assessment/Plan: 1. Chronic systolic CHF: Nonischemic cardiomyopathy.  Echo in 12/21 with EF 30-35%, mildly decreased RV systolic function.  LHC in 2/22 showed mild nonobstructive CAD.  Cause of cardiomyopathy is uncertain.  Prior myocarditis is possible. Cardiac MRI in 5/22 showed LV EF 27%, mild LV dilation, normal wall thickness, RV EF 29%, moderate RV dilation, mid-wall LGE possibly suggestive of prior myocarditis.   His brother and nephew had sudden death, I am uncertain if this was due to a cardiomyopathy or a myocardial infarction (could there be a familial cardiomyopathy?).  He does not drink heavily.  Fe studies negative.  Zio patch in 1/22 showed 11% PVCs, so PVCs may play a role. NYHA class II symptoms, not volume overloaded on exam.   - Increase Toprol XL to 25 mg bid.  - Continue Entresto 24/26 bid.   - Continue spironolactone 25 mg daily, BMET today.  - I will have our pharmacist look into SGLT2 inhibitors for him.  He needs to either restart Jardiance 10 mg daily or Farxiga 10 mg daily, whichever gets best coverage.  - Continue current Lasix.  - Think we can hold off on repeating echo until 5/23 (1 year post last echo).  Given age, would be unlikely to place primary prevention ICD and he is not a candidate for CRT with narrow QRS.  2. H/o CVA: 12/21.  Cause is uncertain. TEE showed no PFO.  2 wk monitor showed no AF.  TEE and echo did not show LV thrombus.  I remain concerned that atrial fibrillation underlay his CVA, however this has not been seen. His cardiomyopathy does put him at risk for AF. He now has a LINQ monitor, no atrial fibrillation has been detected.   - Continue ASA 81 and atorvastatin.  3. PVCs: 11% on 1/22 Zio patch.  May contribute to his cardiomyopathy.   - Increasing Toprol XL today.   Followup 3 months.  Loralie Champagne 07/27/2021

## 2021-08-01 ENCOUNTER — Ambulatory Visit (INDEPENDENT_AMBULATORY_CARE_PROVIDER_SITE_OTHER): Payer: PPO

## 2021-08-01 ENCOUNTER — Other Ambulatory Visit: Payer: PPO

## 2021-08-01 DIAGNOSIS — I639 Cerebral infarction, unspecified: Secondary | ICD-10-CM | POA: Diagnosis not present

## 2021-08-01 LAB — CUP PACEART REMOTE DEVICE CHECK
Date Time Interrogation Session: 20220901133805
Implantable Pulse Generator Implant Date: 20220420

## 2021-08-03 ENCOUNTER — Ambulatory Visit: Payer: PPO | Admitting: Cardiology

## 2021-08-03 DIAGNOSIS — E785 Hyperlipidemia, unspecified: Secondary | ICD-10-CM | POA: Diagnosis not present

## 2021-08-03 DIAGNOSIS — Z125 Encounter for screening for malignant neoplasm of prostate: Secondary | ICD-10-CM | POA: Diagnosis not present

## 2021-08-03 DIAGNOSIS — N289 Disorder of kidney and ureter, unspecified: Secondary | ICD-10-CM | POA: Diagnosis not present

## 2021-08-03 DIAGNOSIS — E790 Hyperuricemia without signs of inflammatory arthritis and tophaceous disease: Secondary | ICD-10-CM | POA: Diagnosis not present

## 2021-08-03 DIAGNOSIS — I428 Other cardiomyopathies: Secondary | ICD-10-CM | POA: Diagnosis not present

## 2021-08-03 DIAGNOSIS — D696 Thrombocytopenia, unspecified: Secondary | ICD-10-CM | POA: Diagnosis not present

## 2021-08-09 DIAGNOSIS — K219 Gastro-esophageal reflux disease without esophagitis: Secondary | ICD-10-CM | POA: Diagnosis not present

## 2021-08-09 DIAGNOSIS — N401 Enlarged prostate with lower urinary tract symptoms: Secondary | ICD-10-CM | POA: Diagnosis not present

## 2021-08-09 DIAGNOSIS — Z6834 Body mass index (BMI) 34.0-34.9, adult: Secondary | ICD-10-CM | POA: Diagnosis not present

## 2021-08-09 DIAGNOSIS — N1831 Chronic kidney disease, stage 3a: Secondary | ICD-10-CM | POA: Diagnosis not present

## 2021-08-09 DIAGNOSIS — E7800 Pure hypercholesterolemia, unspecified: Secondary | ICD-10-CM | POA: Diagnosis not present

## 2021-08-09 DIAGNOSIS — Z8739 Personal history of other diseases of the musculoskeletal system and connective tissue: Secondary | ICD-10-CM | POA: Diagnosis not present

## 2021-08-09 DIAGNOSIS — Z0000 Encounter for general adult medical examination without abnormal findings: Secondary | ICD-10-CM | POA: Diagnosis not present

## 2021-08-09 DIAGNOSIS — R42 Dizziness and giddiness: Secondary | ICD-10-CM | POA: Diagnosis not present

## 2021-08-09 DIAGNOSIS — Z23 Encounter for immunization: Secondary | ICD-10-CM | POA: Diagnosis not present

## 2021-08-09 DIAGNOSIS — D696 Thrombocytopenia, unspecified: Secondary | ICD-10-CM | POA: Diagnosis not present

## 2021-08-09 DIAGNOSIS — I428 Other cardiomyopathies: Secondary | ICD-10-CM | POA: Diagnosis not present

## 2021-08-09 NOTE — Progress Notes (Signed)
Carelink Summary Report / Loop Recorder 

## 2021-08-17 ENCOUNTER — Telehealth: Payer: Self-pay | Admitting: Cardiology

## 2021-08-17 NOTE — Telephone Encounter (Signed)
No VM  

## 2021-08-17 NOTE — Telephone Encounter (Signed)
Picard-Tagnolli, Coleen, RN  Picard-Tagnolli, Coleen, RN; P Cv Div [facility] Scheduling Can we please reach out to this patient to reschedule echo and follow up?   Coleen [place] RN        Previous Messages   ----- Message -----  From: Britt Bottom, CMA  Sent: 07/27/2021   1:31 PM EDT  To: Kavin Leech, RN   Pt has upcoming appointment with Garen Lah.  Pt f/u echo.  Echo was cancelled and never rescheduled just making sure ok to f/u without completed testing  Forgot to add to previous message

## 2021-08-18 NOTE — Telephone Encounter (Signed)
Attempted to schedule.  LMOV to call office.  ° °

## 2021-08-31 LAB — CUP PACEART REMOTE DEVICE CHECK
Date Time Interrogation Session: 20221004133738
Implantable Pulse Generator Implant Date: 20220420

## 2021-09-04 ENCOUNTER — Ambulatory Visit (INDEPENDENT_AMBULATORY_CARE_PROVIDER_SITE_OTHER): Payer: PPO

## 2021-09-04 DIAGNOSIS — I639 Cerebral infarction, unspecified: Secondary | ICD-10-CM

## 2021-09-12 NOTE — Progress Notes (Signed)
Carelink Summary Report / Loop Recorder 

## 2021-09-19 ENCOUNTER — Other Ambulatory Visit (HOSPITAL_COMMUNITY): Payer: Self-pay | Admitting: Cardiology

## 2021-09-21 ENCOUNTER — Other Ambulatory Visit: Payer: Self-pay

## 2021-09-21 ENCOUNTER — Ambulatory Visit (INDEPENDENT_AMBULATORY_CARE_PROVIDER_SITE_OTHER): Payer: PPO

## 2021-09-21 DIAGNOSIS — I428 Other cardiomyopathies: Secondary | ICD-10-CM

## 2021-09-21 LAB — ECHOCARDIOGRAM COMPLETE
AR max vel: 2.61 cm2
AV Area VTI: 2.58 cm2
AV Area mean vel: 2.56 cm2
AV Mean grad: 2 mmHg
AV Peak grad: 2.9 mmHg
Ao pk vel: 0.86 m/s
Area-P 1/2: 2.42 cm2
P 1/2 time: 768 msec
S' Lateral: 4.9 cm
Single Plane A4C EF: 41.4 %

## 2021-09-21 MED ORDER — PERFLUTREN LIPID MICROSPHERE
1.0000 mL | INTRAVENOUS | Status: AC | PRN
  Administered 2021-09-21: 2 mL via INTRAVENOUS

## 2021-10-03 LAB — CUP PACEART REMOTE DEVICE CHECK
Date Time Interrogation Session: 20221106133621
Implantable Pulse Generator Implant Date: 20220420

## 2021-10-09 ENCOUNTER — Ambulatory Visit (INDEPENDENT_AMBULATORY_CARE_PROVIDER_SITE_OTHER): Payer: PPO

## 2021-10-09 DIAGNOSIS — I639 Cerebral infarction, unspecified: Secondary | ICD-10-CM | POA: Diagnosis not present

## 2021-10-27 ENCOUNTER — Ambulatory Visit (HOSPITAL_COMMUNITY)
Admission: RE | Admit: 2021-10-27 | Discharge: 2021-10-27 | Disposition: A | Discharge status: Home / Self Care | Payer: PPO | Source: Ambulatory Visit | Attending: Cardiology | Admitting: Cardiology

## 2021-10-27 VITALS — BP 115/75 | HR 57 | Wt 212.4 lb

## 2021-10-27 DIAGNOSIS — I2510 Atherosclerotic heart disease of native coronary artery without angina pectoris: Secondary | ICD-10-CM | POA: Insufficient documentation

## 2021-10-27 DIAGNOSIS — Z8673 Personal history of transient ischemic attack (TIA), and cerebral infarction without residual deficits: Secondary | ICD-10-CM | POA: Diagnosis not present

## 2021-10-27 DIAGNOSIS — R0683 Snoring: Secondary | ICD-10-CM | POA: Insufficient documentation

## 2021-10-27 DIAGNOSIS — Z7982 Long term (current) use of aspirin: Secondary | ICD-10-CM | POA: Diagnosis not present

## 2021-10-27 DIAGNOSIS — I493 Ventricular premature depolarization: Secondary | ICD-10-CM | POA: Insufficient documentation

## 2021-10-27 DIAGNOSIS — I5022 Chronic systolic (congestive) heart failure: Secondary | ICD-10-CM | POA: Insufficient documentation

## 2021-10-27 DIAGNOSIS — I110 Hypertensive heart disease with heart failure: Secondary | ICD-10-CM | POA: Insufficient documentation

## 2021-10-27 DIAGNOSIS — Z7984 Long term (current) use of oral hypoglycemic drugs: Secondary | ICD-10-CM | POA: Diagnosis not present

## 2021-10-27 DIAGNOSIS — I428 Other cardiomyopathies: Secondary | ICD-10-CM | POA: Diagnosis not present

## 2021-10-27 DIAGNOSIS — G4719 Other hypersomnia: Secondary | ICD-10-CM

## 2021-10-27 DIAGNOSIS — I5020 Unspecified systolic (congestive) heart failure: Secondary | ICD-10-CM

## 2021-10-27 DIAGNOSIS — Z79899 Other long term (current) drug therapy: Secondary | ICD-10-CM | POA: Insufficient documentation

## 2021-10-27 LAB — BASIC METABOLIC PANEL
Anion gap: 8 (ref 5–15)
BUN: 25 mg/dL — ABNORMAL HIGH (ref 8–23)
CO2: 25 mmol/L (ref 22–32)
Calcium: 9.3 mg/dL (ref 8.9–10.3)
Chloride: 105 mmol/L (ref 98–111)
Creatinine, Ser: 1.33 mg/dL — ABNORMAL HIGH (ref 0.61–1.24)
GFR, Estimated: 54 mL/min — ABNORMAL LOW (ref >60)
Glucose, Bld: 98 mg/dL (ref 70–99)
Potassium: 4.8 mmol/L (ref 3.5–5.1)
Sodium: 138 mmol/L (ref 135–145)

## 2021-10-27 MED ORDER — SPIRONOLACTONE 25 MG PO TABS: 25.0000 mg | ORAL_TABLET | Freq: Every day | ORAL | 3 refills | Status: DC

## 2021-10-27 MED ORDER — ENTRESTO 49-51 MG PO TABS: 1.0000 | ORAL_TABLET | Freq: Two times a day (BID) | ORAL | 3 refills | Status: DC

## 2021-10-27 NOTE — Patient Instructions (Signed)
Medication Changes:  --Increase Entresto to 49/51 mg Twice daily  --Take Spironolactone 25 mg (1 tab) Daily AT BEDTIME  Lab Work:  *Labs done today, your results will be available in MyChart, we will contact you for abnormal readings.  *Your physician recommends that you return for lab work in: 1-2 weeks at [facility]  Testing/Procedures:  Your physician has recommended that you have a sleep study. This test records several body functions during sleep, including: brain activity, eye movement, oxygen and carbon dioxide blood levels, heart rate and rhythm, breathing rate and rhythm, the flow of air through your mouth and nose, snoring, body muscle movements, and chest and belly movement. ONCE APPROVED BY YOUR INSURANCE COMPANY WE WILL CALL YOU TO SCHEDULE  Referrals:  None  Special Instructions // Education:  NONE  Follow-Up in: 3 months   At the [facility], you and your health needs are our priority. We have a designated team specialized in the treatment of Heart Failure. This Care Team includes your primary Heart Failure Specialized Cardiologist (physician), Advanced Practice Providers (APPs- Physician Assistants and Nurse Practitioners), and Pharmacist who all work together to provide you with the care you need, when you need it.   You may see any of the following providers on your designated Care Team at your next follow up:  Dr Glori Bickers Dr Haynes Kerns, NP Lyda Jester, [place] [facility], [place] Audry Riles, PharmD   Please be sure to bring in all your medications bottles to every appointment.   Need to Contact us:  If you have any questions or concerns before your next appointment please send [place] a message through [place] or call our office at [phone].    TO LEAVE A MESSAGE FOR THE NURSE SELECT OPTION 2, PLEASE LEAVE A MESSAGE INCLUDING: YOUR NAME DATE OF BIRTH CALL BACK NUMBER REASON FOR CALL**this is  important as we prioritize the call backs  YOU WILL RECEIVE A CALL BACK THE SAME DAY AS LONG AS YOU CALL BEFORE 4:00 PM

## 2021-10-27 NOTE — Progress Notes (Signed)
Height:  5'6"    Weight: 212 lb BMI: 34.28  Today's Date: 10/27/21  STOP BANG RISK ASSESSMENT S (snore) Have you been told that you snore?     YES   T (tired) Are you often tired, fatigued, or sleepy during the day?   YES  O (obstruction) Do you stop breathing, choke, or gasp during sleep? NO   P (pressure) Do you have or are you being treated for high blood pressure? YES   B (BMI) Is your body index greater than 35 kg/m? NO   A (age) Are you 81 years old or older? YES   N (neck) Do you have a neck circumference greater than 16 inches?      G (gender) Are you a male? YES   TOTAL STOP/BANG "YES" ANSWERS 5                                                                       For Office Use Only              Procedure Order Form    YES to 3+ Stop Bang questions OR two clinical symptoms - patient qualifies for WatchPAT (CPT 95800)      Clinical Notes: Will consult Sleep Specialist and refer for management of therapy due to patient increased risk of Sleep Apnea. Ordering a sleep study due to the following two clinical symptoms: Excessive daytime sleepiness G47.10 /  Loud snoring R06.83

## 2021-10-29 NOTE — Progress Notes (Signed)
PCP: [name], MD Cardiology: Dr. Garen Lah HF Cardiology: Dr. [name]  80 y.o. with history of CVA in 12/21 and nonischemic cardiomyopathy was referred for evaluation of CHF.  Beginning in 11/21, patient began to develop exertional dyspnea as well as bendopnea.  He had a syncopal episode in 12/21 at [facility] and was admitted.  He was found to have a left MCA CVA.  Echo showed EF 30-35%, TEE showed a negative bubble study.  LHC/RHC in 2/22 showed nonobstructive CAD with CI 2.1.  He was volume overloaded and was started on Lasix 40 mg daily.   Cardiac MRI in 5/22 showed LV EF 27%, mild LV dilation, normal wall thickness, RV EF 29%, moderate RV dilation, mid-wall LGE possibly suggestive of prior myocarditis.  He had ILR implanted to screen for atrial fibrillation post-CVA.   Patient returns for followup of CHF.  Weight is stable.  No atrial fibrillation noted on ILR.  Occasional lightheadedness if he bends over and stands up too fast.  No lightheadedness standing and walking. He has daytime sleepiness and snoring.  No dyspnea walking on flat ground, ok with 1 flight of stairs.  Generalized fatigue.   Labs (12/21): LDL 75 Labs (2/22): K 4.3, creatinine 1.29 Labs (3/22): Transferrin saturation normal, ferritin normal Labs (4/22): K 4.4 => 4.6, creatinine 1.44 => 1.54 Labs (6/22): K 4.4, creatinine 1.4 Labs (9/22): K 4.4, creatinine 1.42, LDL 77, TSH normal  PMH:  1. CVA: Left MCA CVA in 12/21.  - TEE did not show PFO.  - 2 wk Zio monitor in 1/22 did not show atrial fibrillation but 11% PVCs.  - ILR (LINQ) placed 2. HTN 3. BPH 4. GERD 5. Hyperlipidemia 6. Chronic systolic CHF: Nonischemic cardiomyopathy.   - Echo (12/21): EF 30-35%, mildly decreased RV systolic function.  - TEE (1/22): EF 30-35%, negative bubble study, mildly decreased RV systolic function.  - LHC/RHC (2/22): Nonobstructive CAD; mean RA 14 mmHg, PA 60/23 mean 35, mean PCWP 21 mmHg, CI 2.1, PVR 3.2 - Cardiac  MRI (5/22): LV EF 27%, mild LV dilation, normal wall thickness, RV EF 29%, moderate RV dilation, mid-wall LGE possibly suggestive of prior myocarditis.  7. PVCs: Zio patch 1/22 showed 11% PVCs  SH: Lives with wife in [place], retired, occasional ETOH (not heavy), remote smoker.   FH: Brother died at 47, sounds like SCD.  Nephew died at 41, sounds like SCD.  Mother with MI in her 32s.   ROS: All systems reviewed and negative except as per HPI.   Current Outpatient Medications  Medication Sig Dispense Refill   acetaminophen (TYLENOL) 500 MG tablet Take 500 mg by mouth every 6 (six) hours as needed.     allopurinol (ZYLOPRIM) 100 MG tablet Take 100 mg by mouth daily.     aspirin EC 81 MG EC tablet Take 1 tablet (81 mg total) by mouth daily. Swallow whole. 30 tablet 0   atorvastatin (LIPITOR) 40 MG tablet Take 1 tablet (40 mg total) by mouth daily at 6 PM. 30 tablet 0   empagliflozin (JARDIANCE) 10 MG TABS tablet Take 1 tablet (10 mg total) by mouth daily before breakfast. 90 tablet 3   famotidine (PEPCID) 20 MG tablet Take 20 mg by mouth daily.     gabapentin (NEURONTIN) 100 MG capsule Take 100 mg by mouth 2 (two) times daily.     metoprolol succinate (TOPROL-XL) 25 MG 24 hr tablet Take 1 tablet (25 mg total) by mouth 2 (two) times daily. 180 tablet  3   sacubitril-valsartan (ENTRESTO) 49-51 MG Take 1 tablet by mouth 2 (two) times daily. 60 tablet 3   spironolactone (ALDACTONE) 25 MG tablet Take 1 tablet (25 mg total) by mouth at bedtime. 90 tablet 3   No current facility-administered medications for this encounter.   BP 115/75   Pulse (!) 57   Wt 96.3 kg (212 lb 6.4 oz)   SpO2 94%   BMI 34.28 kg/m  General: NAD Neck: No JVD, no thyromegaly or thyroid nodule.  Lungs: Clear to auscultation bilaterally with normal respiratory effort. CV: Nondisplaced PMI.  Heart regular S1/S2, no S3/S4, no murmur.  No peripheral edema.  No carotid bruit.  Normal pedal pulses.  Abdomen: Soft, nontender,  no hepatosplenomegaly, no distention.  Skin: Intact without lesions or rashes.  Neurologic: Alert and oriented x 3.  Psych: Normal affect. Extremities: No clubbing or cyanosis.  HEENT: Normal.   Assessment/Plan: 1. Chronic systolic CHF: Nonischemic cardiomyopathy.  Echo in 12/21 with EF 30-35%, mildly decreased RV systolic function.  LHC in 2/22 showed mild nonobstructive CAD.  Cause of cardiomyopathy is uncertain.  Prior myocarditis is possible. Cardiac MRI in 5/22 showed LV EF 27%, mild LV dilation, normal wall thickness, RV EF 29%, moderate RV dilation, mid-wall LGE possibly suggestive of prior myocarditis.   His brother and nephew had sudden death, I am uncertain if this was due to a cardiomyopathy or a myocardial infarction (could there be a familial cardiomyopathy?).  He does not drink heavily.  Fe studies negative.  Zio patch in 1/22 showed 11% PVCs, so PVCs may play a role. NYHA class II symptoms, not volume overloaded one xam.  - Continue Toprol XL 25 mg bid.  - Increase Entresto to 49/51 bid, BMET today and in 10 days.  - Continue spironolactone 25 mg daily, take qhs to limit BP effect during the day.   - Continue Jardaince 10 mg daily.   - He does not need Lasix at this time.  - Think we can hold off on repeating echo until 5/23 (1 year post last echo).  Given age, would be unlikely to place primary prevention ICD and he is not a candidate for CRT with narrow QRS.  2. H/o CVA: 12/21.  Cause is uncertain. TEE showed no PFO.  2 wk monitor showed no AF.  TEE and echo did not show LV thrombus.  I remain concerned that atrial fibrillation underlay his CVA, however this has not been seen. His cardiomyopathy does put him at risk for AF. He now has a LINQ monitor, no atrial fibrillation has been detected.  - Continue ASA 81 and atorvastatin.  3. PVCs: 11% on 1/22 Zio patch.  May contribute to his cardiomyopathy.   - Continue Toprol XL, will not increase with HR in 50s.   Followup 3 months with  APP.  Loralie Champagne 10/29/2021

## 2021-10-30 ENCOUNTER — Other Ambulatory Visit: Payer: Self-pay | Admitting: Neurology

## 2021-10-30 DIAGNOSIS — I639 Cerebral infarction, unspecified: Secondary | ICD-10-CM

## 2021-11-07 ENCOUNTER — Encounter: Payer: Self-pay | Admitting: Cardiology

## 2021-11-07 ENCOUNTER — Other Ambulatory Visit: Payer: Self-pay

## 2021-11-07 ENCOUNTER — Ambulatory Visit (INDEPENDENT_AMBULATORY_CARE_PROVIDER_SITE_OTHER): Payer: PPO | Admitting: Cardiology

## 2021-11-07 VITALS — BP 110/62 | HR 57 | Ht 66.0 in | Wt 211.0 lb

## 2021-11-07 DIAGNOSIS — I639 Cerebral infarction, unspecified: Secondary | ICD-10-CM | POA: Diagnosis not present

## 2021-11-07 DIAGNOSIS — I428 Other cardiomyopathies: Secondary | ICD-10-CM | POA: Diagnosis not present

## 2021-11-07 NOTE — Patient Instructions (Signed)
Medication Instructions:  Your physician recommends that you continue on your current medications as directed. Please refer to the Current Medication list given to you today.   *If you need a refill on your cardiac medications before your next appointment, please call your pharmacy*   Lab Work: None ordered  If you have labs (blood work) drawn today and your tests are completely normal, you will receive your results only by: [place] (if you have MyChart) OR A paper copy in the mail If you have any lab test that is abnormal or we need to change your treatment, we will call you to review the results.   Testing/Procedures:  Echocardiogram - Your physician has requested that you have an echocardiogram. Echocardiography is a painless test that uses sound waves to create images of your heart. It provides your doctor with information about the size and shape of your heart and how well your heart's chambers and valves are working. This procedure takes approximately one hour. There are no restrictions for this procedure.   Follow-Up: At [facility], you and your health needs are our priority.  As part of our continuing mission to provide you with exceptional heart care, we have created designated Provider Care Teams.  These Care Teams include your primary Cardiologist (physician) and Advanced Practice Providers (APPs -  Physician Assistants and Nurse Practitioners) who all work together to provide you with the care you need, when you need it.  We recommend signing up for the patient portal called "MyChart".  Sign up information is provided on this After Visit Summary.  MyChart is used to connect with patients for Virtual Visits (Telemedicine).  Patients are able to view lab/test results, encounter notes, upcoming appointments, etc.  Non-urgent messages can be sent to your provider as well.   To learn more about what you can do with MyChart, go to [email or web address].    Your next  appointment:   6 month(s)  The format for your next appointment:   In Person  Provider:   You may see Kate Sable, MD or one of the following Advanced Practice Providers on your designated Care Team:   Murray Hodgkins, NP Christell Faith, PA-C Cadence Kathlen Mody, PA-C :1}    Other Instructions N/A

## 2021-11-07 NOTE — Progress Notes (Signed)
Cardiology Office Note:    Date:  11/07/2021   ID:  Gabriel Martin, DOB 1941-02-04, MRN [id number]  PCP:  [name], MD  [facility] Cardiologist:  Kate Sable, MD  [facility] Electrophysiologist:  None   Referring MD: [name], MD   Chief Complaint  Patient presents with   Other    3 month follow up post ECHO -- Meds reviewed verbally with patient.     History of Present Illness:    Gabriel Martin is a 80 y.o. male with a hx of hypertension, HFrEF, EF 30 to 35%, CVA 10/2019 who presents for follow-up.    Being seen due to nonischemic cardiomyopathy.  Was seen at CHF clinic about 2 weeks ago, Entresto titrated to 49 to 51 mg twice daily.  Patient also on Aldactone, Toprol-XL.  Feels well, denies dizziness.  Not on any diuretics or Lasix, denies shortness of breath, edema.  Tolerating medications as prescribed.  Prior notes Echocardiogram 08/2021 EF 30 to 35% Patient presented to the ED on 12/8 due to confusion, lightheadedness and loss consciousness, In the ED, work-up with an MRI showed 2 acute left MCA territory cortical infarcts.   Echocardiogram 10/2020 moderate to severely reduced ejection fraction, EF 30 to 35%, global hypokinesis.   Left heart cath 01/10/2021 mild nonobstructive CAD. CMR 03/2021 EF 27%.  Mid wall LGE.  Past Medical History:  Diagnosis Date   Acid reflux 05/12/2015   Anemia    Arthritis    finger thumb   Benign neoplasm of large bowel    Benign prostatic hyperplasia with urinary obstruction 05/12/2015   Change in blood platelet count 05/12/2015   CHF (congestive heart failure) (HCC)    Chronic kidney disease 05/12/2015   GERD (gastroesophageal reflux disease)    HLD (hyperlipidemia) 05/12/2015   Hyperlipidemia    Nocturia associated with benign prostatic hypertrophy    Penile ulcer 05/12/2015   Skin cancer    Stroke ([place])    Thrombocytopenia [facility])     Past Surgical History:  Procedure Laterality Date   CATARACT  EXTRACTION W/PHACO Left 02/29/2016   Procedure: CATARACT EXTRACTION PHACO AND INTRAOCULAR LENS PLACEMENT ([place]) left;  Surgeon: Leandrew Koyanagi, MD;  Location: [place];  Service: Ophthalmology;  Laterality: Left;   CATARACT EXTRACTION W/PHACO Right 04/25/2016   Procedure: CATARACT EXTRACTION PHACO AND INTRAOCULAR LENS PLACEMENT (I[facility]);  Surgeon: Leandrew Koyanagi, MD;  Location: [place];  Service: Ophthalmology;  Laterality: Right;   COLONOSCOPY WITH PROPOFOL N/A 07/01/2017   Procedure: COLONOSCOPY WITH PROPOFOL;  Surgeon: Manya Silvas, MD;  Location: [facility] ENDOSCOPY;  Service: Endoscopy;  Laterality: N/A;   EYE SURGERY     HEMORRHOID SURGERY     RIGHT/LEFT HEART CATH AND CORONARY ANGIOGRAPHY Bilateral 01/10/2021   Procedure: RIGHT/LEFT HEART CATH AND CORONARY ANGIOGRAPHY;  Surgeon: Nelva Bush, MD;  Location: [place] CV LAB;  Service: Cardiovascular;  Laterality: Bilateral;   TEE WITHOUT CARDIOVERSION N/A 11/30/2020   Procedure: TRANSESOPHAGEAL ECHOCARDIOGRAM (TEE);  Surgeon: Kate Sable, MD;  Location: [facility] ORS;  Service: Cardiovascular;  Laterality: N/A;    Current Medications: Current Meds  Medication Sig   acetaminophen (TYLENOL) 500 MG tablet Take 500 mg by mouth every 6 (six) hours as needed.   allopurinol (ZYLOPRIM) 100 MG tablet Take 100 mg by mouth daily.   aspirin EC 81 MG EC tablet Take 1 tablet (81 mg total) by mouth daily. Swallow whole.   atorvastatin (LIPITOR) 40 MG tablet Take 1 tablet (40 mg  total) by mouth daily at 6 PM.   empagliflozin (JARDIANCE) 10 MG TABS tablet Take 1 tablet (10 mg total) by mouth daily before breakfast.   famotidine (PEPCID) 20 MG tablet Take 20 mg by mouth daily.   gabapentin (NEURONTIN) 100 MG capsule Take 100 mg by mouth 2 (two) times daily.   metoprolol succinate (TOPROL-XL) 25 MG 24 hr tablet Take 1 tablet (25 mg total) by mouth 2 (two) times daily.   sacubitril-valsartan (ENTRESTO) 49-51 MG Take 1  tablet by mouth 2 (two) times daily.   spironolactone (ALDACTONE) 25 MG tablet Take 1 tablet (25 mg total) by mouth at bedtime.     Allergies:   Patient has no known allergies.   Social History   Socioeconomic History   Marital status: Married    Spouse name: Not on file   Number of children: Not on file   Years of education: Not on file   Highest education level: Not on file  Occupational History   Not on file  Tobacco Use   Smoking status: Former    Types: Cigarettes    Quit date: 11/26/1978    Years since quitting: 42.9   Smokeless tobacco: Never   Tobacco comments:    quit 40 years ago  Vaping Use   Vaping Use: Never used  Substance and Sexual Activity   Alcohol use: Yes    Alcohol/week: 6.0 standard drinks    Types: 6 Cans of beer per week    Comment: occasional   Drug use: No   Sexual activity: Never  Other Topics Concern   Not on file  Social History Narrative   Not on file   Social Determinants of Health   Financial Resource Strain: Not on file  Food Insecurity: Not on file  Transportation Needs: Not on file  Physical Activity: Not on file  Stress: Not on file  Social Connections: Not on file     Family History: The patient's family history includes Heart attack in his mother; Heart attack (age of onset: 48) in his nephew; Heart attack (age of onset: 27) in his brother; Heart disease in his brother.  ROS:   Please see the history of present illness.     All other systems reviewed and are negative.  EKGs/Labs/Other Studies Reviewed:    The following studies were reviewed today:   EKG:  EKG not ordered today.   Recent Labs: 02/21/2021: B Natriuretic Peptide 376.4; Hemoglobin 13.8; Platelets PLATELET CLUMPS NOTED ON SMEAR, UNABLE TO ESTIMATE 10/27/2021: BUN 25; Creatinine, Ser 1.33; Potassium 4.8; Sodium 138  Recent Lipid Panel    Component Value Date/Time   CHOL 137 11/04/2020 0526   TRIG 68 11/04/2020 0526   HDL 48 11/04/2020 0526   CHOLHDL  2.9 11/04/2020 0526   VLDL 14 11/04/2020 0526   LDLCALC 75 11/04/2020 0526     Risk Assessment/Calculations:      Physical Exam:    VS:  BP 110/62 (BP Location: Left Arm, Patient Position: Sitting, Cuff Size: Normal)   Pulse (!) 57   Ht 5\' 6"  (1.676 m)   Wt 211 lb (95.7 kg)   SpO2 95%   BMI 34.06 kg/m     Wt Readings from Last 3 Encounters:  11/07/21 211 lb (95.7 kg)  10/27/21 212 lb 6.4 oz (96.3 kg)  07/27/21 211 lb 6.4 oz (95.9 kg)     GEN:  Well nourished, well developed in no acute distress HEENT: Normal NECK: No JVD; No carotid bruits  LYMPHATICS: No lymphadenopathy CARDIAC: RRR, no murmurs, rubs, gallops RESPIRATORY:  Clear to auscultation without rales, wheezing or rhonchi  ABDOMEN: Soft, non-tender, non-distended MUSCULOSKELETAL:  No edema; No deformity  SKIN: Warm and dry NEUROLOGIC:  Alert and oriented x 3 PSYCHIATRIC:  Normal affect   ASSESSMENT:    1. Cerebrovascular accident (CVA), unspecified mechanism ([place])   2. NICM (nonischemic cardiomyopathy) ([place])    PLAN:    In order of problems listed above:  Hx of CVA 10/2020, 2 left MCA cortical territory.  Cont aspirin, Lipitor. Has an ILR in place, no evidence for A. fib or atrial flutter so far. Nonischemic cardiomyopathy, EF 30 to 35%.  Left heart cath 12/2020 with mild, non obstructive CAD.  NYHA class II symptoms.  Continue Toprol-XL 25 mg daily, Entresto 49-51 mg daily, aldactone 25mg  qd, jardiance 10mg  qd, appears euvolemic.  Follow-up in 6 months repeat echo in 6 months.  Total encounter time more than 30 minutes  Greater than 50% was spent in counseling and coordination of care with the patient    Medication Adjustments/Labs and Tests Ordered: Current medicines are reviewed at length with the patient today.  Concerns regarding medicines are outlined above.  Orders Placed This Encounter  Procedures   ECHOCARDIOGRAM COMPLETE     No orders of the defined types were placed in this  encounter.   Patient Instructions  Medication Instructions:  Your physician recommends that you continue on your current medications as directed. Please refer to the Current Medication list given to you today.   *If you need a refill on your cardiac medications before your next appointment, please call your pharmacy*   Lab Work: None ordered  If you have labs (blood work) drawn today and your tests are completely normal, you will receive your results only by: [place] (if you have MyChart) OR A paper copy in the mail If you have any lab test that is abnormal or we need to change your treatment, we will call you to review the results.   Testing/Procedures:  Echocardiogram - Your physician has requested that you have an echocardiogram. Echocardiography is a painless test that uses sound waves to create images of your heart. It provides your doctor with information about the size and shape of your heart and how well your heart's chambers and valves are working. This procedure takes approximately one hour. There are no restrictions for this procedure.   Follow-Up: At [facility], you and your health needs are our priority.  As part of our continuing mission to provide you with exceptional heart care, we have created designated Provider Care Teams.  These Care Teams include your primary Cardiologist (physician) and Advanced Practice Providers (APPs -  Physician Assistants and Nurse Practitioners) who all work together to provide you with the care you need, when you need it.  We recommend signing up for the patient portal called "MyChart".  Sign up information is provided on this After Visit Summary.  MyChart is used to connect with patients for Virtual Visits (Telemedicine).  Patients are able to view lab/test results, encounter notes, upcoming appointments, etc.  Non-urgent messages can be sent to your provider as well.   To learn more about what you can do with MyChart, go to  [email or web address].    Your next appointment:   6 month(s)  The format for your next appointment:   In Person  Provider:   You may see Kate Sable, MD or one of the following Advanced Practice Providers on  your designated Care Team:   Murray Hodgkins, NP Christell Faith, PA-C Cadence Kathlen Mody, [place] :1}    Other Instructions N/A    Signed, Kate Sable, MD  11/07/2021 9:26 AM    [facility]

## 2021-11-08 ENCOUNTER — Other Ambulatory Visit
Admission: RE | Admit: 2021-11-08 | Discharge: 2021-11-08 | Disposition: A | Discharge status: Home / Self Care | Payer: PPO | Attending: Cardiology | Admitting: Cardiology

## 2021-11-08 DIAGNOSIS — I5020 Unspecified systolic (congestive) heart failure: Secondary | ICD-10-CM | POA: Insufficient documentation

## 2021-11-08 LAB — BASIC METABOLIC PANEL
Anion gap: 7 (ref 5–15)
BUN: 28 mg/dL — ABNORMAL HIGH (ref 8–23)
CO2: 25 mmol/L (ref 22–32)
Calcium: 9 mg/dL (ref 8.9–10.3)
Chloride: 107 mmol/L (ref 98–111)
Creatinine, Ser: 1.24 mg/dL (ref 0.61–1.24)
GFR, Estimated: 58 mL/min — ABNORMAL LOW (ref >60)
Glucose, Bld: 100 mg/dL — ABNORMAL HIGH (ref 70–99)
Potassium: 4.8 mmol/L (ref 3.5–5.1)
Sodium: 139 mmol/L (ref 135–145)

## 2021-11-13 ENCOUNTER — Ambulatory Visit (INDEPENDENT_AMBULATORY_CARE_PROVIDER_SITE_OTHER): Payer: PPO

## 2021-11-13 DIAGNOSIS — I639 Cerebral infarction, unspecified: Secondary | ICD-10-CM

## 2021-11-13 LAB — CUP PACEART REMOTE DEVICE CHECK
Date Time Interrogation Session: 20221218000534
Implantable Pulse Generator Implant Date: 20220420

## 2021-11-22 NOTE — Progress Notes (Signed)
Carelink Summary Report / Loop Recorder 

## 2021-12-18 ENCOUNTER — Ambulatory Visit (INDEPENDENT_AMBULATORY_CARE_PROVIDER_SITE_OTHER): Payer: PPO

## 2021-12-18 DIAGNOSIS — I428 Other cardiomyopathies: Secondary | ICD-10-CM | POA: Diagnosis not present

## 2021-12-18 LAB — CUP PACEART REMOTE DEVICE CHECK
Date Time Interrogation Session: 20230123001030
Implantable Pulse Generator Implant Date: 20220420

## 2021-12-19 ENCOUNTER — Other Ambulatory Visit: Payer: PPO

## 2021-12-21 DIAGNOSIS — L821 Other seborrheic keratosis: Secondary | ICD-10-CM | POA: Diagnosis not present

## 2021-12-21 DIAGNOSIS — Z08 Encounter for follow-up examination after completed treatment for malignant neoplasm: Secondary | ICD-10-CM | POA: Diagnosis not present

## 2021-12-21 DIAGNOSIS — L84 Corns and callosities: Secondary | ICD-10-CM | POA: Diagnosis not present

## 2021-12-21 DIAGNOSIS — L570 Actinic keratosis: Secondary | ICD-10-CM | POA: Diagnosis not present

## 2021-12-21 DIAGNOSIS — Z85828 Personal history of other malignant neoplasm of skin: Secondary | ICD-10-CM | POA: Diagnosis not present

## 2021-12-24 ENCOUNTER — Encounter (HOSPITAL_BASED_OUTPATIENT_CLINIC_OR_DEPARTMENT_OTHER): Payer: PPO | Admitting: Cardiology

## 2021-12-28 DIAGNOSIS — H02132 Senile ectropion of right lower eyelid: Secondary | ICD-10-CM | POA: Diagnosis not present

## 2021-12-28 NOTE — Progress Notes (Signed)
Carelink Summary Report / Loop Recorder 

## 2022-01-19 ENCOUNTER — Telehealth (HOSPITAL_COMMUNITY): Payer: Self-pay | Admitting: Family Medicine

## 2022-01-21 LAB — CUP PACEART REMOTE DEVICE CHECK
Date Time Interrogation Session: 20230225000524
Implantable Pulse Generator Implant Date: 20220420

## 2022-01-22 ENCOUNTER — Ambulatory Visit (INDEPENDENT_AMBULATORY_CARE_PROVIDER_SITE_OTHER): Payer: PPO

## 2022-01-22 DIAGNOSIS — I428 Other cardiomyopathies: Secondary | ICD-10-CM | POA: Diagnosis not present

## 2022-01-25 NOTE — Progress Notes (Signed)
Carelink Summary Report / Loop Recorder 

## 2022-01-26 ENCOUNTER — Encounter (HOSPITAL_COMMUNITY): Payer: PPO

## 2022-01-31 DIAGNOSIS — Z6834 Body mass index (BMI) 34.0-34.9, adult: Secondary | ICD-10-CM | POA: Diagnosis not present

## 2022-01-31 DIAGNOSIS — Z0000 Encounter for general adult medical examination without abnormal findings: Secondary | ICD-10-CM | POA: Diagnosis not present

## 2022-01-31 DIAGNOSIS — R7309 Other abnormal glucose: Secondary | ICD-10-CM | POA: Diagnosis not present

## 2022-01-31 DIAGNOSIS — Z8739 Personal history of other diseases of the musculoskeletal system and connective tissue: Secondary | ICD-10-CM | POA: Diagnosis not present

## 2022-01-31 DIAGNOSIS — D696 Thrombocytopenia, unspecified: Secondary | ICD-10-CM | POA: Diagnosis not present

## 2022-01-31 DIAGNOSIS — R42 Dizziness and giddiness: Secondary | ICD-10-CM | POA: Diagnosis not present

## 2022-01-31 DIAGNOSIS — E7800 Pure hypercholesterolemia, unspecified: Secondary | ICD-10-CM | POA: Diagnosis not present

## 2022-01-31 DIAGNOSIS — K219 Gastro-esophageal reflux disease without esophagitis: Secondary | ICD-10-CM | POA: Diagnosis not present

## 2022-01-31 DIAGNOSIS — N401 Enlarged prostate with lower urinary tract symptoms: Secondary | ICD-10-CM | POA: Diagnosis not present

## 2022-01-31 DIAGNOSIS — N1831 Chronic kidney disease, stage 3a: Secondary | ICD-10-CM | POA: Diagnosis not present

## 2022-01-31 DIAGNOSIS — I428 Other cardiomyopathies: Secondary | ICD-10-CM | POA: Diagnosis not present

## 2022-02-02 NOTE — Progress Notes (Signed)
PCP: [name], MD ?Cardiology: Dr. Garen Lah ?HF Cardiology: Dr. [name] ? ?81 y.o. with history of CVA in 12/21 and nonischemic cardiomyopathy was referred for evaluation of CHF.  Beginning in 11/21, patient began to develop exertional dyspnea as well as bendopnea.  He had a syncopal episode in 12/21 at [facility] and was admitted.  He was found to have a left MCA CVA.  Echo showed EF 30-35%, TEE showed a negative bubble study.  LHC/RHC in 2/22 showed nonobstructive CAD with CI 2.1.  He was volume overloaded and was started on Lasix 40 mg daily.  ? ?Cardiac MRI in 5/22 showed LV EF 27%, mild LV dilation, normal wall thickness, RV EF 29%, moderate RV dilation, mid-wall LGE possibly suggestive of prior myocarditis.  He had ILR implanted to screen for atrial fibrillation post-CVA.  ? ?Today he returns for HF follow up with his wife. Overall feeling fine. Main issue is continued dizziness ongoing x years. He has had no falls. He is not short of breath walking on flat ground or with ADLs. Denies abnormal bleeding, palpitations, CP, edema, or PND/Orthopnea. Appetite ok. No fever or chills. Weight at home 204-208 pounds. Taking all medications.  ? ?Device interrogation (personally reviewed): No VT, 2.9 hr/day activity, VP 60% ? ?Labs (12/21): LDL 75 ?Labs (2/22): K 4.3, creatinine 1.29 ?Labs (3/22): Transferrin saturation normal, ferritin normal ?Labs (4/22): K 4.4 => 4.6, creatinine 1.44 => 1.54 ?Labs (6/22): K 4.4, creatinine 1.4 ?Labs (9/22): K 4.4, creatinine 1.42, LDL 77, TSH normal ?Labs (3/23): K 5.0, creatinine 1.3 ? ?PMH:  ?1. CVA: Left MCA CVA in 12/21.  ?- TEE did not show PFO.  ?- 2 wk Zio monitor in 1/22 did not show atrial fibrillation but 11% PVCs.  ?- ILR (LINQ) placed ?2. HTN ?3. BPH ?4. GERD ?5. Hyperlipidemia ?6. Chronic systolic CHF: Nonischemic cardiomyopathy.   ?- Echo (12/21): EF 30-35%, mildly decreased RV systolic function.  ?- TEE (1/22): EF 30-35%, negative bubble study, mildly  decreased RV systolic function.  ?- LHC/RHC (2/22): Nonobstructive CAD; mean RA 14 mmHg, PA 60/23 mean 35, mean PCWP 21 mmHg, CI 2.1, PVR 3.2 ?- Cardiac MRI (5/22): LV EF 27%, mild LV dilation, normal wall thickness, RV EF 29%, moderate RV dilation, mid-wall LGE possibly suggestive of prior myocarditis.  ?7. PVCs: Zio patch 1/22 showed 11% PVCs ? ?SH: Lives with wife in [place], retired, occasional ETOH (not heavy), remote smoker.  ? ?FH: Brother died at 52, sounds like SCD.  Nephew died at 77, sounds like SCD.  Mother with MI in her 20s.  ? ?ROS: All systems reviewed and negative except as per HPI.  ? ?Current Outpatient Medications  ?Medication Sig Dispense Refill  ? acetaminophen (TYLENOL) 500 MG tablet Take 500 mg by mouth every 6 (six) hours as needed.    ? allopurinol (ZYLOPRIM) 100 MG tablet Take 100 mg by mouth daily.    ? aspirin EC 81 MG EC tablet Take 1 tablet (81 mg total) by mouth daily. Swallow whole. 30 tablet 0  ? atorvastatin (LIPITOR) 40 MG tablet Take 1 tablet (40 mg total) by mouth daily at 6 PM. 30 tablet 0  ? empagliflozin (JARDIANCE) 10 MG TABS tablet Take 1 tablet (10 mg total) by mouth daily before breakfast. 90 tablet 3  ? famotidine (PEPCID) 20 MG tablet Take 20 mg by mouth daily.    ? gabapentin (NEURONTIN) 100 MG capsule Take 100 mg by mouth daily.    ? metoprolol succinate (TOPROL-XL)  25 MG 24 hr tablet Take 1 tablet (25 mg total) by mouth 2 (two) times daily. 180 tablet 3  ? sacubitril-valsartan (ENTRESTO) 49-51 MG Take 1 tablet by mouth 2 (two) times daily. 60 tablet 3  ? spironolactone (ALDACTONE) 25 MG tablet Take 1 tablet (25 mg total) by mouth at bedtime. 90 tablet 3  ? ?No current facility-administered medications for this encounter.  ? ?Wt Readings from Last 3 Encounters:  ?02/05/22 95.7 kg (211 lb)  ?11/07/21 95.7 kg (211 lb)  ?10/27/21 96.3 kg (212 lb 6.4 oz)  ? ?BP 114/70   Pulse 61   Wt 95.7 kg (211 lb)   SpO2 94%   BMI 34.06 kg/m?  ?General:  NAD. No resp  difficulty ?HEENT: Normal ?Neck: Supple. No JVD. Carotids 2+ bilat; no bruits. No lymphadenopathy or thryomegaly appreciated. ?Cor: PMI nondisplaced. Regular rate & rhythm. No rubs, gallops or murmurs. ?Lungs: Clear ?Abdomen: Soft, nontender, nondistended. No hepatosplenomegaly. No bruits or masses. Good bowel sounds. ?Extremities: No cyanosis, clubbing, rash, edema ?Neuro: Alert & oriented x 3, cranial nerves grossly intact. Moves all 4 extremities w/o difficulty. Affect pleasant. ? ?Assessment/Plan: ?1. Chronic systolic CHF: Nonischemic cardiomyopathy.  Echo in 12/21 with EF 30-35%, mildly decreased RV systolic function.  LHC in 2/22 showed mild nonobstructive CAD.  Cause of cardiomyopathy is uncertain.  Prior myocarditis is possible. Cardiac MRI in 5/22 showed LV EF 27%, mild LV dilation, normal wall thickness, RV EF 29%, moderate RV dilation, mid-wall LGE possibly suggestive of prior myocarditis.   His brother and nephew had sudden death, I am uncertain if this was due to a cardiomyopathy or a myocardial infarction (could there be a familial cardiomyopathy?).  He does not drink heavily.  Fe studies negative.  Zio patch in 1/22 showed 11% PVCs, so PVCs may play a role. NYHA class II symptoms, not volume overloaded one xam.  ?- Continue Toprol XL 25 mg bid.  ?- Continue Entresto 49/51 bid, BMET today. ?- Continue spironolactone 25 mg daily, take qhs to limit BP effect during the day.   ?- Continue Jardaince 10 mg daily.   ?- He does not need Lasix at this time.  ?- Think we can hold off on repeating echo until 5/23 (1 year post last echo).  Given age, would be unlikely to place primary prevention ICD and he is not a candidate for CRT with narrow QRS.  ?2. H/o CVA: 12/21.  Cause is uncertain. TEE showed no PFO.  2 wk monitor showed no AF.  TEE and echo did not show LV thrombus.  I remain concerned that atrial fibrillation underlay his CVA, however this has not been seen. His cardiomyopathy does put him at risk for  AF. He now has a LINQ monitor, no atrial fibrillation has been detected.  ?- Continue ASA 81 and atorvastatin.  ?3. PVCs: 11% on 1/22 Zio patch.  May contribute to his cardiomyopathy.   ?- Continue Toprol XL. ? ?Followup 2-3 months with Dr. [name] + echo ? ?[place] FNP ?02/05/2022 ? ?

## 2022-02-05 ENCOUNTER — Other Ambulatory Visit: Payer: Self-pay

## 2022-02-05 ENCOUNTER — Ambulatory Visit (HOSPITAL_COMMUNITY)
Admission: RE | Admit: 2022-02-05 | Discharge: 2022-02-05 | Disposition: A | Discharge status: Home / Self Care | Payer: PPO | Source: Ambulatory Visit | Attending: Family Medicine | Admitting: Family Medicine

## 2022-02-05 ENCOUNTER — Encounter (HOSPITAL_COMMUNITY): Payer: Self-pay

## 2022-02-05 VITALS — BP 114/70 | HR 61 | Wt 211.0 lb

## 2022-02-05 DIAGNOSIS — I639 Cerebral infarction, unspecified: Secondary | ICD-10-CM

## 2022-02-05 DIAGNOSIS — I5032 Chronic diastolic (congestive) heart failure: Secondary | ICD-10-CM | POA: Diagnosis not present

## 2022-02-05 DIAGNOSIS — I428 Other cardiomyopathies: Secondary | ICD-10-CM | POA: Diagnosis not present

## 2022-02-05 DIAGNOSIS — I493 Ventricular premature depolarization: Secondary | ICD-10-CM | POA: Diagnosis not present

## 2022-02-05 DIAGNOSIS — Z7984 Long term (current) use of oral hypoglycemic drugs: Secondary | ICD-10-CM | POA: Insufficient documentation

## 2022-02-05 DIAGNOSIS — Z7982 Long term (current) use of aspirin: Secondary | ICD-10-CM | POA: Insufficient documentation

## 2022-02-05 DIAGNOSIS — I110 Hypertensive heart disease with heart failure: Secondary | ICD-10-CM | POA: Insufficient documentation

## 2022-02-05 DIAGNOSIS — Z79899 Other long term (current) drug therapy: Secondary | ICD-10-CM | POA: Diagnosis not present

## 2022-02-05 DIAGNOSIS — I2510 Atherosclerotic heart disease of native coronary artery without angina pectoris: Secondary | ICD-10-CM | POA: Diagnosis not present

## 2022-02-05 DIAGNOSIS — I5020 Unspecified systolic (congestive) heart failure: Secondary | ICD-10-CM | POA: Diagnosis not present

## 2022-02-05 DIAGNOSIS — I5022 Chronic systolic (congestive) heart failure: Secondary | ICD-10-CM | POA: Diagnosis not present

## 2022-02-05 DIAGNOSIS — Z8673 Personal history of transient ischemic attack (TIA), and cerebral infarction without residual deficits: Secondary | ICD-10-CM | POA: Diagnosis not present

## 2022-02-05 LAB — BASIC METABOLIC PANEL
Anion gap: 6 (ref 5–15)
BUN: 31 mg/dL — ABNORMAL HIGH (ref 8–23)
CO2: 24 mmol/L (ref 22–32)
Calcium: 9.1 mg/dL (ref 8.9–10.3)
Chloride: 108 mmol/L (ref 98–111)
Creatinine, Ser: 1.59 mg/dL — ABNORMAL HIGH (ref 0.61–1.24)
GFR, Estimated: 43 mL/min — ABNORMAL LOW (ref >60)
Glucose, Bld: 111 mg/dL — ABNORMAL HIGH (ref 70–99)
Potassium: 5.2 mmol/L — ABNORMAL HIGH (ref 3.5–5.1)
Sodium: 138 mmol/L (ref 135–145)

## 2022-02-05 NOTE — Patient Instructions (Signed)
Thank you for coming in today ? ?Labs were done today, if any labs are abnormal the clinic will call you ? ?Your physician recommends that you schedule a follow-up appointment in:  ?2-3 months with Dr. [name] with echocardiogram ? ?Your physician has requested that you have an echocardiogram. Echocardiography is a painless test that uses sound waves to create images of your heart. It provides your doctor with information about the size and shape of your heart and how well your heart?s chambers and valves are working. This procedure takes approximately one hour. There are no restrictions for this procedure. ?  ?At the [facility], you and your health needs are our priority. As part of our continuing mission to provide you with exceptional heart care, we have created designated Provider Care Teams. These Care Teams include your primary Cardiologist (physician) and Advanced Practice Providers (APPs- Physician Assistants and Nurse Practitioners) who all work together to provide you with the care you need, when you need it.  ? ?You may see any of the following providers on your designated Care Team at your next follow up: ?Dr Glori Bickers ?Dr Loralie Champagne ?Darrick Grinder, NP ?Lyda Jester, PA ?Jessica [place],NP ?Marlyce Huge, PA ?Audry Riles, PharmD ? ? ?Please be sure to bring in all your medications bottles to every appointment.  ? ?If you have any questions or concerns before your next appointment please send [place] a message through [place] or call our office at [phone].   ? ?TO LEAVE A MESSAGE FOR THE NURSE SELECT OPTION 2, PLEASE LEAVE A MESSAGE INCLUDING: ?YOUR NAME ?DATE OF BIRTH ?CALL BACK NUMBER ?REASON FOR CALL**this is important as we prioritize the call backs ? ?YOU WILL RECEIVE A CALL BACK THE SAME DAY AS LONG AS YOU CALL BEFORE 4:00 PM ? ?

## 2022-02-07 DIAGNOSIS — Z8739 Personal history of other diseases of the musculoskeletal system and connective tissue: Secondary | ICD-10-CM | POA: Diagnosis not present

## 2022-02-07 DIAGNOSIS — D696 Thrombocytopenia, unspecified: Secondary | ICD-10-CM | POA: Diagnosis not present

## 2022-02-07 DIAGNOSIS — I428 Other cardiomyopathies: Secondary | ICD-10-CM | POA: Diagnosis not present

## 2022-02-07 DIAGNOSIS — N401 Enlarged prostate with lower urinary tract symptoms: Secondary | ICD-10-CM | POA: Diagnosis not present

## 2022-02-07 DIAGNOSIS — R351 Nocturia: Secondary | ICD-10-CM | POA: Diagnosis not present

## 2022-02-07 DIAGNOSIS — N1831 Chronic kidney disease, stage 3a: Secondary | ICD-10-CM | POA: Diagnosis not present

## 2022-02-07 DIAGNOSIS — Z0000 Encounter for general adult medical examination without abnormal findings: Secondary | ICD-10-CM | POA: Diagnosis not present

## 2022-02-07 DIAGNOSIS — R42 Dizziness and giddiness: Secondary | ICD-10-CM | POA: Diagnosis not present

## 2022-02-07 DIAGNOSIS — Z6833 Body mass index (BMI) 33.0-33.9, adult: Secondary | ICD-10-CM | POA: Diagnosis not present

## 2022-02-07 DIAGNOSIS — R7309 Other abnormal glucose: Secondary | ICD-10-CM | POA: Diagnosis not present

## 2022-02-26 ENCOUNTER — Ambulatory Visit (INDEPENDENT_AMBULATORY_CARE_PROVIDER_SITE_OTHER): Payer: PPO

## 2022-02-26 DIAGNOSIS — I428 Other cardiomyopathies: Secondary | ICD-10-CM

## 2022-02-26 LAB — CUP PACEART REMOTE DEVICE CHECK
Date Time Interrogation Session: 20230401000856
Implantable Pulse Generator Implant Date: 20220420

## 2022-03-08 NOTE — Progress Notes (Signed)
Carelink Summary Report / Loop Recorder 

## 2022-03-21 DIAGNOSIS — M2042 Other hammer toe(s) (acquired), left foot: Secondary | ICD-10-CM | POA: Diagnosis not present

## 2022-03-21 DIAGNOSIS — M778 Other enthesopathies, not elsewhere classified: Secondary | ICD-10-CM | POA: Diagnosis not present

## 2022-03-21 DIAGNOSIS — Q6689 Other  specified congenital deformities of feet: Secondary | ICD-10-CM | POA: Diagnosis not present

## 2022-03-21 DIAGNOSIS — M2041 Other hammer toe(s) (acquired), right foot: Secondary | ICD-10-CM | POA: Diagnosis not present

## 2022-03-29 LAB — CUP PACEART REMOTE DEVICE CHECK
Date Time Interrogation Session: 20230504000553
Implantable Pulse Generator Implant Date: 20220420

## 2022-04-02 ENCOUNTER — Ambulatory Visit (INDEPENDENT_AMBULATORY_CARE_PROVIDER_SITE_OTHER): Payer: PPO

## 2022-04-02 DIAGNOSIS — I428 Other cardiomyopathies: Secondary | ICD-10-CM | POA: Diagnosis not present

## 2022-04-17 NOTE — Progress Notes (Signed)
Carelink Summary Report / Loop Recorder 

## 2022-05-01 ENCOUNTER — Ambulatory Visit (INDEPENDENT_AMBULATORY_CARE_PROVIDER_SITE_OTHER): Payer: PPO

## 2022-05-01 DIAGNOSIS — I428 Other cardiomyopathies: Secondary | ICD-10-CM | POA: Diagnosis not present

## 2022-05-01 LAB — ECHOCARDIOGRAM COMPLETE
AR max vel: 2.02 cm2
AV Area VTI: 1.92 cm2
AV Area mean vel: 2.06 cm2
AV Mean grad: 4 mmHg
AV Peak grad: 5.9 mmHg
Ao pk vel: 1.21 m/s
Area-P 1/2: 4.39 cm2

## 2022-05-01 MED ORDER — PERFLUTREN LIPID MICROSPHERE
1.0000 mL | INTRAVENOUS | Status: AC | PRN
  Administered 2022-05-01: 2 mL via INTRAVENOUS

## 2022-05-03 ENCOUNTER — Telehealth: Payer: Self-pay

## 2022-05-03 NOTE — Telephone Encounter (Signed)
The patient has been notified of the result via VM per DPR on file.  Encouraged patient to call back with any questions or concerns.  

## 2022-05-03 NOTE — Telephone Encounter (Signed)
-----   Message from Kate Sable, MD sent at 05/03/2022  9:37 AM EDT ----- Echocardiogram with moderate to severely reduced EF, unchanged from prior.  Continue medications as prescribed.  Keep follow-up appointment, may consider referral for ICD.

## 2022-05-06 LAB — CUP PACEART REMOTE DEVICE CHECK
Date Time Interrogation Session: 20230606001311
Implantable Pulse Generator Implant Date: 20220420

## 2022-05-07 ENCOUNTER — Other Ambulatory Visit (HOSPITAL_COMMUNITY): Payer: Self-pay | Admitting: Cardiology

## 2022-05-07 ENCOUNTER — Ambulatory Visit (INDEPENDENT_AMBULATORY_CARE_PROVIDER_SITE_OTHER): Payer: PPO

## 2022-05-07 DIAGNOSIS — I639 Cerebral infarction, unspecified: Secondary | ICD-10-CM | POA: Diagnosis not present

## 2022-05-08 ENCOUNTER — Encounter: Payer: Self-pay | Admitting: Cardiology

## 2022-05-08 ENCOUNTER — Ambulatory Visit (INDEPENDENT_AMBULATORY_CARE_PROVIDER_SITE_OTHER): Payer: PPO | Admitting: Cardiology

## 2022-05-08 VITALS — BP 110/70 | HR 78 | Ht 66.0 in | Wt 211.0 lb

## 2022-05-08 DIAGNOSIS — I639 Cerebral infarction, unspecified: Secondary | ICD-10-CM | POA: Diagnosis not present

## 2022-05-08 DIAGNOSIS — I428 Other cardiomyopathies: Secondary | ICD-10-CM | POA: Diagnosis not present

## 2022-05-08 MED ORDER — ENTRESTO 97-103 MG PO TABS
1.0000 | ORAL_TABLET | Freq: Two times a day (BID) | ORAL | 5 refills | Status: DC
Start: 2022-05-08 — End: 2022-08-02

## 2022-05-08 MED ORDER — SPIRONOLACTONE 25 MG PO TABS: 12.5000 mg | ORAL_TABLET | Freq: Every day | ORAL | 5 refills | Status: DC

## 2022-05-08 NOTE — Patient Instructions (Signed)
Medication Instructions:  Your physician has recommended you make the following change in your medication:   INCREASE Entresto to 97/103 mg twice a day. An Rx has been sent to your pharmacy  DECREASE Spironolactone to 12.5 mg daily. An Rx has been sent to your pharmacy.  *If you need a refill on your cardiac medications before your next appointment, please call your pharmacy*   Lab Work: None ordered  If you have labs (blood work) drawn today and your tests are completely normal, you will receive your results only by: [place] (if you have MyChart) OR A paper copy in the mail If you have any lab test that is abnormal or we need to change your treatment, we will call you to review the results.   Testing/Procedures: None ordered   Follow-Up: At [facility], you and your health needs are our priority.  As part of our continuing mission to provide you with exceptional heart care, we have created designated Provider Care Teams.  These Care Teams include your primary Cardiologist (physician) and Advanced Practice Providers (APPs -  Physician Assistants and Nurse Practitioners) who all work together to provide you with the care you need, when you need it.  We recommend signing up for the patient portal called "MyChart".  Sign up information is provided on this After Visit Summary.  MyChart is used to connect with patients for Virtual Visits (Telemedicine).  Patients are able to view lab/test results, encounter notes, upcoming appointments, etc.  Non-urgent messages can be sent to your provider as well.   To learn more about what you can do with MyChart, go to [email or web address].    Your next appointment:   6 week(s)  The format for your next appointment:   In Person  Provider:   You may see Kate Sable, MD or one of the following Advanced Practice Providers on your designated Care Team:   Murray Hodgkins, NP Christell Faith, PA-C Cadence Kathlen Mody, [place]   Other  Instructions N/A  Important Information About [place]

## 2022-05-08 NOTE — Progress Notes (Signed)
Cardiology Office Note:    Date:  05/08/2022   ID:  Gabriel Martin, DOB Dec 20, 1940, MRN [id number]  PCP:  [name], MD  [facility] Cardiologist:  Kate Sable, MD  [facility] Electrophysiologist:  None   Referring MD: [name], MD   Chief Complaint  Patient presents with   Other    6 Month f/u no complaints today. Meds reviewed verbally with pt.    History of Present Illness:    Gabriel Martin is a 81 y.o. male with a hx of hypertension, HFrEF, EF 30 to 35%, CVA 10/2019 who presents for follow-up.    Being seen due to nonischemic cardiomyopathy and medication titration.  Takes medications as prescribed, ran out of Entresto 2-3 days ago stating not able to get refills.  Denies edema, denies chest pain or shortness of breath.  States being in good shape.   Prior notes Echocardiogram 08/2021 EF 30 to 35% Patient presented to the ED on 12/8 due to confusion, lightheadedness and loss consciousness, In the ED, work-up with an MRI showed 2 acute left MCA territory cortical infarcts.   Echocardiogram 10/2020 moderate to severely reduced ejection fraction, EF 30 to 35%, global hypokinesis.   Left heart cath 01/10/2021 mild nonobstructive CAD. CMR 03/2021 EF 27%.  Mid wall LGE.  Past Medical History:  Diagnosis Date   Acid reflux 05/12/2015   Anemia    Arthritis    finger thumb   Benign neoplasm of large bowel    Benign prostatic hyperplasia with urinary obstruction 05/12/2015   Change in blood platelet count 05/12/2015   CHF (congestive heart failure) (HCC)    Chronic kidney disease 05/12/2015   GERD (gastroesophageal reflux disease)    HLD (hyperlipidemia) 05/12/2015   Hyperlipidemia    Nocturia associated with benign prostatic hypertrophy    Penile ulcer 05/12/2015   Skin cancer    Stroke ([place])    Thrombocytopenia [facility])     Past Surgical History:  Procedure Laterality Date   CATARACT EXTRACTION W/PHACO Left 02/29/2016   Procedure: CATARACT  EXTRACTION PHACO AND INTRAOCULAR LENS PLACEMENT ([place]) left;  Surgeon: Leandrew Koyanagi, MD;  Location: [place];  Service: Ophthalmology;  Laterality: Left;   CATARACT EXTRACTION W/PHACO Right 04/25/2016   Procedure: CATARACT EXTRACTION PHACO AND INTRAOCULAR LENS PLACEMENT (IOC);  Surgeon: Leandrew Koyanagi, MD;  Location: [place];  Service: Ophthalmology;  Laterality: Right;   COLONOSCOPY WITH PROPOFOL N/A 07/01/2017   Procedure: COLONOSCOPY WITH PROPOFOL;  Surgeon: Manya Silvas, MD;  Location: [facility] ENDOSCOPY;  Service: Endoscopy;  Laterality: N/A;   EYE SURGERY     HEMORRHOID SURGERY     RIGHT/LEFT HEART CATH AND CORONARY ANGIOGRAPHY Bilateral 01/10/2021   Procedure: RIGHT/LEFT HEART CATH AND CORONARY ANGIOGRAPHY;  Surgeon: Nelva Bush, MD;  Location: [place] CV LAB;  Service: Cardiovascular;  Laterality: Bilateral;   TEE WITHOUT CARDIOVERSION N/A 11/30/2020   Procedure: TRANSESOPHAGEAL ECHOCARDIOGRAM (TEE);  Surgeon: Kate Sable, MD;  Location: [facility] ORS;  Service: Cardiovascular;  Laterality: N/A;    Current Medications: Current Meds  Medication Sig   acetaminophen (TYLENOL) 500 MG tablet Take 500 mg by mouth every 6 (six) hours as needed.   allopurinol (ZYLOPRIM) 100 MG tablet Take 100 mg by mouth daily.   aspirin EC 81 MG EC tablet Take 1 tablet (81 mg total) by mouth daily. Swallow whole.   atorvastatin (LIPITOR) 40 MG tablet Take 1 tablet (40 mg total) by mouth daily at 6 PM.   empagliflozin (JARDIANCE)  10 MG TABS tablet Take 1 tablet (10 mg total) by mouth daily before breakfast.   famotidine (PEPCID) 20 MG tablet Take 20 mg by mouth daily.   gabapentin (NEURONTIN) 100 MG capsule Take 100 mg by mouth daily.   metoprolol succinate (TOPROL-XL) 25 MG 24 hr tablet Take 1 tablet (25 mg total) by mouth 2 (two) times daily.   sacubitril-valsartan (ENTRESTO) 97-103 MG Take 1 tablet by mouth 2 (two) times daily.   [DISCONTINUED] ENTRESTO 49-51 MG  TAKE 1 TABLET BY MOUTH TWICE A DAY   [DISCONTINUED] spironolactone (ALDACTONE) 25 MG tablet Take 1 tablet (25 mg total) by mouth at bedtime.     Allergies:   Patient has no known allergies.   Social History   Socioeconomic History   Marital status: Married    Spouse name: Not on file   Number of children: Not on file   Years of education: Not on file   Highest education level: Not on file  Occupational History   Not on file  Tobacco Use   Smoking status: Former    Types: Cigarettes    Quit date: 11/26/1978    Years since quitting: 43.4   Smokeless tobacco: Never   Tobacco comments:    quit 40 years ago  Vaping Use   Vaping Use: Never used  Substance and Sexual Activity   Alcohol use: Yes    Alcohol/week: 6.0 standard drinks of alcohol    Types: 6 Cans of beer per week    Comment: occasional   Drug use: No   Sexual activity: Never  Other Topics Concern   Not on file  Social History Narrative   Not on file   Social Determinants of Health   Financial Resource Strain: Not on file  Food Insecurity: Not on file  Transportation Needs: Not on file  Physical Activity: Not on file  Stress: Not on file  Social Connections: Not on file     Family History: The patient's family history includes Heart attack in his mother; Heart attack (age of onset: 62) in his nephew; Heart attack (age of onset: 2) in his brother; Heart disease in his brother.  ROS:   Please see the history of present illness.     All other systems reviewed and are negative.  EKGs/Labs/Other Studies Reviewed:    The following studies were reviewed today:   EKG:  EKG not ordered today.   Recent Labs: 02/05/2022: BUN 31; Creatinine, Ser 1.59; Potassium 5.2; Sodium 138  Recent Lipid Panel    Component Value Date/Time   CHOL 137 11/04/2020 0526   TRIG 68 11/04/2020 0526   HDL 48 11/04/2020 0526   CHOLHDL 2.9 11/04/2020 0526   VLDL 14 11/04/2020 0526   LDLCALC 75 11/04/2020 0526     Risk  Assessment/Calculations:      Physical Exam:    VS:  BP 110/70 (BP Location: Left Arm, Patient Position: Sitting, Cuff Size: Normal)   Pulse 78   Ht '5\' 6"'$  (1.676 m)   Wt 211 lb (95.7 kg)   SpO2 97%   BMI 34.06 kg/m     Wt Readings from Last 3 Encounters:  05/08/22 211 lb (95.7 kg)  02/05/22 211 lb (95.7 kg)  11/07/21 211 lb (95.7 kg)     GEN:  Well nourished, well developed in no acute distress HEENT: Normal NECK: No JVD; No carotid bruits LYMPHATICS: No lymphadenopathy CARDIAC: RRR, no murmurs, rubs, gallops RESPIRATORY:  Clear to auscultation without rales, wheezing or  rhonchi  ABDOMEN: Soft, non-tender, non-distended MUSCULOSKELETAL:  No edema; No deformity  SKIN: Warm and dry NEUROLOGIC:  Alert and oriented x 3 PSYCHIATRIC:  Normal affect   ASSESSMENT:    1. Cerebrovascular accident (CVA), unspecified mechanism ([place])   2. NICM (nonischemic cardiomyopathy) ([place])     PLAN:    In order of problems listed above:  Hx of CVA 10/2020, ILR with no evidence for A-fib or flutter.  Cont aspirin, Lipitor. Nonischemic cardiomyopathy, EF 30 to 35% unchanged from prior.  Left heart cath 12/2020 with mild, non obstructive CAD.  NYHA class II symptoms.  Increase Entresto to 97-103 mg twice daily.  Reduce Aldactone to 12.5 mg daily, continue Toprol-XL 25 mg daily, jardiance '10mg'$  qd, appears euvolemic.  If BP stays stable, consider increasing Aldactone. If EF stays below 35% despite optimal medication, refer to EP for ICD consideration.  Follow-up in 6 weeks for medication titration.    Total encounter time more than 4 minutes  Greater than 50% was spent in counseling and coordination of care with the patient    Medication Adjustments/Labs and Tests Ordered: Current medicines are reviewed at length with the patient today.  Concerns regarding medicines are outlined above.  Orders Placed This Encounter  Procedures   EKG 12-Lead     Meds ordered this encounter  Medications    sacubitril-valsartan (ENTRESTO) 97-103 MG    Sig: Take 1 tablet by mouth 2 (two) times daily.    Dispense:  60 tablet    Refill:  5    Dose increase   spironolactone (ALDACTONE) 25 MG tablet    Sig: Take 0.5 tablets (12.5 mg total) by mouth at bedtime.    Dispense:  30 tablet    Refill:  5    Decrease dosage     Patient Instructions  Medication Instructions:  Your physician has recommended you make the following change in your medication:   INCREASE Entresto to 97/103 mg twice a day. An Rx has been sent to your pharmacy  DECREASE Spironolactone to 12.5 mg daily. An Rx has been sent to your pharmacy.  *If you need a refill on your cardiac medications before your next appointment, please call your pharmacy*   Lab Work: None ordered  If you have labs (blood work) drawn today and your tests are completely normal, you will receive your results only by: [place] (if you have MyChart) OR A paper copy in the mail If you have any lab test that is abnormal or we need to change your treatment, we will call you to review the results.   Testing/Procedures: None ordered   Follow-Up: At [facility], you and your health needs are our priority.  As part of our continuing mission to provide you with exceptional heart care, we have created designated Provider Care Teams.  These Care Teams include your primary Cardiologist (physician) and Advanced Practice Providers (APPs -  Physician Assistants and Nurse Practitioners) who all work together to provide you with the care you need, when you need it.  We recommend signing up for the patient portal called "MyChart".  Sign up information is provided on this After Visit Summary.  MyChart is used to connect with patients for Virtual Visits (Telemedicine).  Patients are able to view lab/test results, encounter notes, upcoming appointments, etc.  Non-urgent messages can be sent to your provider as well.   To learn more about what you can do  with MyChart, go to [email or web address].    Your next  appointment:   6 week(s)  The format for your next appointment:   In Person  Provider:   You may see Kate Sable, MD or one of the following Advanced Practice Providers on your designated Care Team:   Murray Hodgkins, NP Christell Faith, PA-C Cadence Kathlen Mody, [place]   Other Instructions N/A  Important Information About Sugar         Signed, Kate Sable, MD  05/08/2022 8:55 AM    [place]

## 2022-05-30 NOTE — Progress Notes (Signed)
Carelink Summary Report / Loop Recorder 

## 2022-06-03 LAB — CUP PACEART REMOTE DEVICE CHECK
Date Time Interrogation Session: 20230709000651
Implantable Pulse Generator Implant Date: 20220420

## 2022-06-11 ENCOUNTER — Ambulatory Visit (INDEPENDENT_AMBULATORY_CARE_PROVIDER_SITE_OTHER): Payer: PPO

## 2022-06-11 DIAGNOSIS — I639 Cerebral infarction, unspecified: Secondary | ICD-10-CM

## 2022-06-19 DIAGNOSIS — M5136 Other intervertebral disc degeneration, lumbar region: Secondary | ICD-10-CM | POA: Diagnosis not present

## 2022-06-19 DIAGNOSIS — M545 Low back pain, unspecified: Secondary | ICD-10-CM | POA: Diagnosis not present

## 2022-06-25 ENCOUNTER — Encounter: Payer: Self-pay | Admitting: Cardiology

## 2022-06-25 ENCOUNTER — Ambulatory Visit (INDEPENDENT_AMBULATORY_CARE_PROVIDER_SITE_OTHER): Payer: PPO | Admitting: Cardiology

## 2022-06-25 VITALS — BP 120/62 | HR 59 | Ht 66.0 in | Wt 213.0 lb

## 2022-06-25 DIAGNOSIS — I428 Other cardiomyopathies: Secondary | ICD-10-CM | POA: Diagnosis not present

## 2022-06-25 DIAGNOSIS — I639 Cerebral infarction, unspecified: Secondary | ICD-10-CM | POA: Diagnosis not present

## 2022-06-25 NOTE — Progress Notes (Signed)
Cardiology Office Note:    Date:  06/25/2022   ID:  Gabriel Martin, DOB July 29, 1941, MRN [id number]  PCP:  [name], MD  [facility] Cardiologist:  Kate Sable, MD  [facility] Electrophysiologist:  None   Referring MD: [name], MD   Chief Complaint  Patient presents with   Follow-up    6 week follow up. Patient states sometimes when he bends over and stands up he gets dizzy.  Meds reviewed with patient.     History of Present Illness:    Gabriel Martin is a 81 y.o. male with a hx of hypertension, HFrEF, EF 30 to 35%, CVA 10/2019 who presents for follow-up.    Previously seen for medication titration, nonischemic cardiomyopathy.  Entresto titrated to 97-103 mg twice daily, also on Toprol and Aldactone.  Takes medications as prescribed.  Denies chest pain or shortness of breath, denies edema.  Has occasional dizziness when he stands up from a seated position, or otherwise feels well has no concerns at this time.  Prior notes Echocardiogram 08/2021 EF 30 to 35% Patient presented to the ED on 12/8 due to confusion, lightheadedness and loss consciousness, In the ED, work-up with an MRI showed 2 acute left MCA territory cortical infarcts.   Echocardiogram 10/2020 moderate to severely reduced ejection fraction, EF 30 to 35%, global hypokinesis.   Left heart cath 01/10/2021 mild nonobstructive CAD. CMR 03/2021 EF 27%.  Mid wall LGE.  Past Medical History:  Diagnosis Date   Acid reflux 05/12/2015   Anemia    Arthritis    finger thumb   Benign neoplasm of large bowel    Benign prostatic hyperplasia with urinary obstruction 05/12/2015   Change in blood platelet count 05/12/2015   CHF (congestive heart failure) (HCC)    Chronic kidney disease 05/12/2015   GERD (gastroesophageal reflux disease)    HLD (hyperlipidemia) 05/12/2015   Hyperlipidemia    Nocturia associated with benign prostatic hypertrophy    Penile ulcer 05/12/2015   Skin cancer    Stroke  ([place])    Thrombocytopenia [facility])     Past Surgical History:  Procedure Laterality Date   CATARACT EXTRACTION W/PHACO Left 02/29/2016   Procedure: CATARACT EXTRACTION PHACO AND INTRAOCULAR LENS PLACEMENT ([place]) left;  Surgeon: Leandrew Koyanagi, MD;  Location: [place];  Service: Ophthalmology;  Laterality: Left;   CATARACT EXTRACTION W/PHACO Right 04/25/2016   Procedure: CATARACT EXTRACTION PHACO AND INTRAOCULAR LENS PLACEMENT (IOC);  Surgeon: Leandrew Koyanagi, MD;  Location: [place];  Service: Ophthalmology;  Laterality: Right;   COLONOSCOPY WITH PROPOFOL N/A 07/01/2017   Procedure: COLONOSCOPY WITH PROPOFOL;  Surgeon: Manya Silvas, MD;  Location: [facility] ENDOSCOPY;  Service: Endoscopy;  Laterality: N/A;   EYE SURGERY     HEMORRHOID SURGERY     RIGHT/LEFT HEART CATH AND CORONARY ANGIOGRAPHY Bilateral 01/10/2021   Procedure: RIGHT/LEFT HEART CATH AND CORONARY ANGIOGRAPHY;  Surgeon: Nelva Bush, MD;  Location: [place] CV LAB;  Service: Cardiovascular;  Laterality: Bilateral;   TEE WITHOUT CARDIOVERSION N/A 11/30/2020   Procedure: TRANSESOPHAGEAL ECHOCARDIOGRAM (TEE);  Surgeon: Kate Sable, MD;  Location: [facility] ORS;  Service: Cardiovascular;  Laterality: N/A;    Current Medications: Current Meds  Medication Sig   acetaminophen (TYLENOL) 500 MG tablet Take 500 mg by mouth every 6 (six) hours as needed.   allopurinol (ZYLOPRIM) 100 MG tablet Take 100 mg by mouth daily.   aspirin EC 81 MG EC tablet Take 1 tablet (81 mg total) by mouth daily.  Swallow whole.   atorvastatin (LIPITOR) 40 MG tablet Take 1 tablet (40 mg total) by mouth daily at 6 PM.   empagliflozin (JARDIANCE) 10 MG TABS tablet Take 1 tablet (10 mg total) by mouth daily before breakfast.   famotidine (PEPCID) 20 MG tablet Take 20 mg by mouth daily.   gabapentin (NEURONTIN) 100 MG capsule Take 100 mg by mouth daily.   metoprolol succinate (TOPROL-XL) 25 MG 24 hr tablet Take 1 tablet (25 mg  total) by mouth 2 (two) times daily.   sacubitril-valsartan (ENTRESTO) 97-103 MG Take 1 tablet by mouth 2 (two) times daily.   spironolactone (ALDACTONE) 25 MG tablet Take 0.5 tablets (12.5 mg total) by mouth at bedtime.     Allergies:   Patient has no known allergies.   Social History   Socioeconomic History   Marital status: Married    Spouse name: Not on file   Number of children: Not on file   Years of education: Not on file   Highest education level: Not on file  Occupational History   Not on file  Tobacco Use   Smoking status: Former    Types: Cigarettes    Quit date: 11/26/1978    Years since quitting: 43.6   Smokeless tobacco: Never   Tobacco comments:    quit 40 years ago  Vaping Use   Vaping Use: Never used  Substance and Sexual Activity   Alcohol use: Yes    Alcohol/week: 6.0 standard drinks of alcohol    Types: 6 Cans of beer per week    Comment: occasional   Drug use: No   Sexual activity: Never  Other Topics Concern   Not on file  Social History Narrative   Not on file   Social Determinants of Health   Financial Resource Strain: Not on file  Food Insecurity: Not on file  Transportation Needs: Not on file  Physical Activity: Not on file  Stress: Not on file  Social Connections: Not on file     Family History: The patient's family history includes Heart attack in his mother; Heart attack (age of onset: 70) in his nephew; Heart attack (age of onset: 2) in his brother; Heart disease in his brother.  ROS:   Please see the history of present illness.     All other systems reviewed and are negative.  EKGs/Labs/Other Studies Reviewed:    The following studies were reviewed today:   EKG:  EKG not ordered today.   Recent Labs: 02/05/2022: BUN 31; Creatinine, Ser 1.59; Potassium 5.2; Sodium 138  Recent Lipid Panel    Component Value Date/Time   CHOL 137 11/04/2020 0526   TRIG 68 11/04/2020 0526   HDL 48 11/04/2020 0526   CHOLHDL 2.9 11/04/2020  0526   VLDL 14 11/04/2020 0526   LDLCALC 75 11/04/2020 0526     Risk Assessment/Calculations:      Physical Exam:    VS:  BP 120/62 (BP Location: Left Arm, Patient Position: Sitting, Cuff Size: Normal)   Pulse (!) 59   Ht '5\' 6"'$  (1.676 m)   Wt 213 lb (96.6 kg)   SpO2 94%   BMI 34.38 kg/m     Wt Readings from Last 3 Encounters:  06/25/22 213 lb (96.6 kg)  05/08/22 211 lb (95.7 kg)  02/05/22 211 lb (95.7 kg)     GEN:  Well nourished, well developed in no acute distress HEENT: Normal NECK: No JVD; No carotid bruits CARDIAC: RRR, no murmurs, rubs, gallops RESPIRATORY:  Clear to auscultation without rales, wheezing or rhonchi  ABDOMEN: Soft, non-tender, non-distended MUSCULOSKELETAL:  No edema; No deformity  SKIN: Warm and dry NEUROLOGIC:  Alert and oriented x 3 PSYCHIATRIC:  Normal affect   ASSESSMENT:    1. NICM (nonischemic cardiomyopathy) ([place])   2. Cerebrovascular accident (CVA), unspecified mechanism ([place])      PLAN:    In order of problems listed above:  Nonischemic cardiomyopathy, EF 30 to 35% .  Left heart cath 12/2020 with mild, non obstructive CAD.  NYHA class II symptoms.  Patient appears euvolemic.  Heart rate 59.  Continue Entresto to 97-103 mg twice daily, Toprol-XL 25 mg daily, Aldactone 12.5 mg daily,jardiance '10mg'$  qd.  Not titrating Aldactone due to dizziness. Repeat limited echo in 3 months to evaluate EF.  If EF stays below 35% , refer to EP for ICD consideration. Hx of CVA 10/2020, ILR with no evidence for A-fib or flutter so far.  Cont aspirin, Lipitor.  Follow-up in 4 months  Total encounter time more than 4 minutes  Greater than 50% was spent in counseling and coordination of care with the patient    Medication Adjustments/Labs and Tests Ordered: Current medicines are reviewed at length with the patient today.  Concerns regarding medicines are outlined above.  Orders Placed This Encounter  Procedures   ECHOCARDIOGRAM LIMITED     No  orders of the defined types were placed in this encounter.    Patient Instructions  Medication Instructions:  - Your physician recommends that you continue on your current medications as directed. Please refer to the Current Medication list given to you today.  *If you need a refill on your cardiac medications before your next appointment, please call your pharmacy*   Lab Work: - none ordered  If you have labs (blood work) drawn today and your tests are completely normal, you will receive your results only by: [place] (if you have MyChart) OR A paper copy in the mail If you have any lab test that is abnormal or we need to change your treatment, we will call you to review the results.   Testing/Procedures:  1) Echocardiogram (Limited): in 3 months - Your physician has requested that you have an echocardiogram (limited). Echocardiography is a painless test that uses sound waves to create images of your heart. It provides your doctor with information about the size and shape of your heart and how well your heart's chambers and valves are working. This procedure takes approximately one hour. There are no restrictions for this procedure. There is a possibility that an IV may need to be started during your test to inject an image enhancing agent. This is done to obtain more optimal pictures of your heart. Therefore we ask that you do at least drink some water prior to coming in to hydrate your veins.      Follow-Up: At [facility], you and your health needs are our priority.  As part of our continuing mission to provide you with exceptional heart care, we have created designated Provider Care Teams.  These Care Teams include your primary Cardiologist (physician) and Advanced Practice Providers (APPs -  Physician Assistants and Nurse Practitioners) who all work together to provide you with the care you need, when you need it.  We recommend signing up for the patient portal called  "MyChart".  Sign up information is provided on this After Visit Summary.  MyChart is used to connect with patients for Virtual Visits (Telemedicine).  Patients are  able to view lab/test results, encounter notes, upcoming appointments, etc.  Non-urgent messages can be sent to your provider as well.   To learn more about what you can do with MyChart, go to [email or web address].    Your next appointment:   4 month(s)  The format for your next appointment:   In Person  Provider:   You may see Kate Sable, MD or one of the following Advanced Practice Providers on your designated Care Team:   Murray Hodgkins, NP Christell Faith, PA-C Cadence Kathlen Mody, PA-C    Other Instructions  Echocardiogram An echocardiogram is a test that uses sound waves (ultrasound) to produce images of the heart. Images from an echocardiogram can provide important information about: Heart size and shape. The size and thickness and movement of your heart's walls. Heart muscle function and strength. Heart valve function or if you have stenosis. Stenosis is when the heart valves are too narrow. If blood is flowing backward through the heart valves (regurgitation). A tumor or infectious growth around the heart valves. Areas of heart muscle that are not working well because of poor blood flow or injury from a heart attack. Aneurysm detection. An aneurysm is a weak or damaged part of an artery wall. The wall bulges out from the normal force of blood pumping through the body. Tell a health care provider about: Any allergies you have. All medicines you are taking, including vitamins, herbs, eye drops, creams, and over-the-counter medicines. Any blood disorders you have. Any surgeries you have had. Any medical conditions you have. Whether you are pregnant or may be pregnant. What are the risks? Generally, this is a safe test. However, problems may occur, including an allergic reaction to dye (contrast) that may be used  during the test. What happens before the test? No specific preparation is needed. You may eat and drink normally. What happens during the test?  You will take off your clothes from the waist up and put on a hospital gown. Electrodes or electrocardiogram (ECG)patches may be placed on your chest. The electrodes or patches are then connected to a device that monitors your heart rate and rhythm. You will lie down on a table for an ultrasound exam. A gel will be applied to your chest to help sound waves pass through your skin. A handheld device, called a transducer, will be pressed against your chest and moved over your heart. The transducer produces sound waves that travel to your heart and bounce back (or "echo" back) to the transducer. These sound waves will be captured in real-time and changed into images of your heart that can be viewed on a video monitor. The images will be recorded on a computer and reviewed by your health care provider. You may be asked to change positions or hold your breath for a short time. This makes it easier to get different views or better views of your heart. In some cases, you may receive contrast through an IV in one of your veins. This can improve the quality of the pictures from your heart. The procedure may vary among health care providers and hospitals. What can I expect after the test? You may return to your normal, everyday life, including diet, activities, and medicines, unless your health care provider tells you not to do that. Follow these instructions at home: It is up to you to get the results of your test. Ask your health care provider, or the department that is doing the test, when your results will be ready.  Keep all follow-up visits. This is important. Summary An echocardiogram is a test that uses sound waves (ultrasound) to produce images of the heart. Images from an echocardiogram can provide important information about the size and shape of your  heart, heart muscle function, heart valve function, and other possible heart problems. You do not need to do anything to prepare before this test. You may eat and drink normally. After the echocardiogram is completed, you may return to your normal, everyday life, unless your health care provider tells you not to do that. This information is not intended to replace advice given to you by your health care provider. Make sure you discuss any questions you have with your health care provider. Document Revised: 07/26/2021 Document Reviewed: 07/05/2020 Elsevier Patient Education  [place]         Signed, Kate Sable, MD  06/25/2022 10:13 AM    [place]

## 2022-06-25 NOTE — Patient Instructions (Signed)
Medication Instructions:  - Your physician recommends that you continue on your current medications as directed. Please refer to the Current Medication list given to you today.  *If you need a refill on your cardiac medications before your next appointment, please call your pharmacy*   Lab Work: - none ordered  If you have labs (blood work) drawn today and your tests are completely normal, you will receive your results only by: [place] (if you have MyChart) OR A paper copy in the mail If you have any lab test that is abnormal or we need to change your treatment, we will call you to review the results.   Testing/Procedures:  1) Echocardiogram (Limited): in 3 months - Your physician has requested that you have an echocardiogram (limited). Echocardiography is a painless test that uses sound waves to create images of your heart. It provides your doctor with information about the size and shape of your heart and how well your heart's chambers and valves are working. This procedure takes approximately one hour. There are no restrictions for this procedure. There is a possibility that an IV may need to be started during your test to inject an image enhancing agent. This is done to obtain more optimal pictures of your heart. Therefore we ask that you do at least drink some water prior to coming in to hydrate your veins.      Follow-Up: At [facility], you and your health needs are our priority.  As part of our continuing mission to provide you with exceptional heart care, we have created designated Provider Care Teams.  These Care Teams include your primary Cardiologist (physician) and Advanced Practice Providers (APPs -  Physician Assistants and Nurse Practitioners) who all work together to provide you with the care you need, when you need it.  We recommend signing up for the patient portal called "MyChart".  Sign up information is provided on this After Visit Summary.  MyChart is used  to connect with patients for Virtual Visits (Telemedicine).  Patients are able to view lab/test results, encounter notes, upcoming appointments, etc.  Non-urgent messages can be sent to your provider as well.   To learn more about what you can do with MyChart, go to [email or web address].    Your next appointment:   4 month(s)  The format for your next appointment:   In Person  Provider:   You may see Kate Sable, MD or one of the following Advanced Practice Providers on your designated Care Team:   Murray Hodgkins, NP Christell Faith, PA-C Cadence Kathlen Mody, PA-C    Other Instructions  Echocardiogram An echocardiogram is a test that uses sound waves (ultrasound) to produce images of the heart. Images from an echocardiogram can provide important information about: Heart size and shape. The size and thickness and movement of your heart's walls. Heart muscle function and strength. Heart valve function or if you have stenosis. Stenosis is when the heart valves are too narrow. If blood is flowing backward through the heart valves (regurgitation). A tumor or infectious growth around the heart valves. Areas of heart muscle that are not working well because of poor blood flow or injury from a heart attack. Aneurysm detection. An aneurysm is a weak or damaged part of an artery wall. The wall bulges out from the normal force of blood pumping through the body. Tell a health care provider about: Any allergies you have. All medicines you are taking, including vitamins, herbs, eye drops, creams, and over-the-counter medicines. Any blood  disorders you have. Any surgeries you have had. Any medical conditions you have. Whether you are pregnant or may be pregnant. What are the risks? Generally, this is a safe test. However, problems may occur, including an allergic reaction to dye (contrast) that may be used during the test. What happens before the test? No specific preparation is needed. You  may eat and drink normally. What happens during the test?  You will take off your clothes from the waist up and put on a hospital gown. Electrodes or electrocardiogram (ECG)patches may be placed on your chest. The electrodes or patches are then connected to a device that monitors your heart rate and rhythm. You will lie down on a table for an ultrasound exam. A gel will be applied to your chest to help sound waves pass through your skin. A handheld device, called a transducer, will be pressed against your chest and moved over your heart. The transducer produces sound waves that travel to your heart and bounce back (or "echo" back) to the transducer. These sound waves will be captured in real-time and changed into images of your heart that can be viewed on a video monitor. The images will be recorded on a computer and reviewed by your health care provider. You may be asked to change positions or hold your breath for a short time. This makes it easier to get different views or better views of your heart. In some cases, you may receive contrast through an IV in one of your veins. This can improve the quality of the pictures from your heart. The procedure may vary among health care providers and hospitals. What can I expect after the test? You may return to your normal, everyday life, including diet, activities, and medicines, unless your health care provider tells you not to do that. Follow these instructions at home: It is up to you to get the results of your test. Ask your health care provider, or the department that is doing the test, when your results will be ready. Keep all follow-up visits. This is important. Summary An echocardiogram is a test that uses sound waves (ultrasound) to produce images of the heart. Images from an echocardiogram can provide important information about the size and shape of your heart, heart muscle function, heart valve function, and other possible heart problems. You  do not need to do anything to prepare before this test. You may eat and drink normally. After the echocardiogram is completed, you may return to your normal, everyday life, unless your health care provider tells you not to do that. This information is not intended to replace advice given to you by your health care provider. Make sure you discuss any questions you have with your health care provider. Document Revised: 07/26/2021 Document Reviewed: 07/05/2020 Elsevier Patient Education  [place]

## 2022-06-28 DIAGNOSIS — M5136 Other intervertebral disc degeneration, lumbar region: Secondary | ICD-10-CM | POA: Diagnosis not present

## 2022-06-28 DIAGNOSIS — M47816 Spondylosis without myelopathy or radiculopathy, lumbar region: Secondary | ICD-10-CM | POA: Diagnosis not present

## 2022-07-06 ENCOUNTER — Other Ambulatory Visit (HOSPITAL_COMMUNITY): Payer: Self-pay | Admitting: Cardiology

## 2022-07-16 ENCOUNTER — Ambulatory Visit (INDEPENDENT_AMBULATORY_CARE_PROVIDER_SITE_OTHER): Payer: PPO

## 2022-07-16 DIAGNOSIS — I428 Other cardiomyopathies: Secondary | ICD-10-CM | POA: Diagnosis not present

## 2022-07-16 NOTE — Progress Notes (Signed)
Carelink Summary Report / Loop Recorder 

## 2022-07-17 LAB — CUP PACEART REMOTE DEVICE CHECK
Date Time Interrogation Session: 20230821001027
Implantable Pulse Generator Implant Date: 20220420

## 2022-08-01 ENCOUNTER — Telehealth: Payer: Self-pay | Admitting: Cardiology

## 2022-08-01 NOTE — Telephone Encounter (Signed)
Pt c/o medication issue:  1. Name of Medication: sacubitril-valsartan (ENTRESTO) 97-103 MG  2. How are you currently taking this medication (dosage and times per day)? Take 1 tablet by mouth 2 (two) times daily. - Oral  3. Are you having a reaction (difficulty breathing--STAT)?   4. What is your medication issue? Pt states he needs a refill on this medication but he cannot afford this medication. Please advise.

## 2022-08-01 NOTE — Telephone Encounter (Signed)
Spoke w/ pt.   He reports that the Delene Loll is too expensive at >$300 per month, as well as the Jardiance.   He reports that Dr. [name] wrote for the [facility]. He was getting financial assistance thru the [employer], but it has "run out". Advised him that I will make Dr. Thereasa Solo nurse aware of his situation and see if there are any other financial resources we have access to.  He is appreciative and will await a call back.

## 2022-08-02 MED ORDER — LOSARTAN POTASSIUM 50 MG PO TABS: 50.0000 mg | ORAL_TABLET | Freq: Every day | ORAL | 1 refills | Status: DC

## 2022-08-02 NOTE — Telephone Encounter (Signed)
Spoke with Dr. Garen Lah and he recommended that when the patient runs out of his Delene Loll he starts Losartan 50 MG once a day. If the PAF is approved for the [facility], we will restart Entresto at that time.  Called patient and informed him of this. Patient verbalized understanding and agreed with plan.

## 2022-08-02 NOTE — Telephone Encounter (Signed)
Called patient and spoke with his wife. She stated that he was in the shower, and asked if I could call back this afternoon.

## 2022-08-02 NOTE — Telephone Encounter (Signed)
Spoke with patient and he only has a couple days left of his Entresto. I informed him that I would leave a PAF at our front desk for him to pick up. He then stated that he would fill it out and return it to [place] to process. I also informed him that I would leave one for Jardiance as well, that he would have to fill out and return to his PCP the prescribing provider.  Patient verbalized understanding and agreed  with plan,.

## 2022-08-06 NOTE — Telephone Encounter (Signed)
Patient dropped off PAF  Placed in nurse box

## 2022-08-07 MED ORDER — ENTRESTO 97-103 MG PO TABS: 1.0000 | ORAL_TABLET | Freq: Two times a day (BID) | ORAL | 3 refills | Status: DC

## 2022-08-07 NOTE — Telephone Encounter (Signed)
After reviewing the application, I called patient to request their income verification that was missing. He stated that they do not file taxes so they do not have a 1040. I advised that he bring in the social security statement for both he and his wife. Patient verbalized understanding and stated that he would bring it by tomorrow morning.

## 2022-08-07 NOTE — Addendum Note (Signed)
Addended by: Kavin Leech on: 08/07/2022 11:42 AM   Modules accepted: Orders

## 2022-08-08 NOTE — Telephone Encounter (Signed)
PAF faxed to [employer] 

## 2022-08-08 NOTE — Telephone Encounter (Signed)
Patient came in dropped off paperwork, placed in box

## 2022-08-10 ENCOUNTER — Telehealth (HOSPITAL_COMMUNITY): Payer: Self-pay | Admitting: Pharmacy Technician

## 2022-08-10 DIAGNOSIS — D696 Thrombocytopenia, unspecified: Secondary | ICD-10-CM | POA: Diagnosis not present

## 2022-08-10 DIAGNOSIS — Z125 Encounter for screening for malignant neoplasm of prostate: Secondary | ICD-10-CM | POA: Diagnosis not present

## 2022-08-10 DIAGNOSIS — N401 Enlarged prostate with lower urinary tract symptoms: Secondary | ICD-10-CM | POA: Diagnosis not present

## 2022-08-10 DIAGNOSIS — E7800 Pure hypercholesterolemia, unspecified: Secondary | ICD-10-CM | POA: Diagnosis not present

## 2022-08-10 DIAGNOSIS — R7309 Other abnormal glucose: Secondary | ICD-10-CM | POA: Diagnosis not present

## 2022-08-10 DIAGNOSIS — R351 Nocturia: Secondary | ICD-10-CM | POA: Diagnosis not present

## 2022-08-10 NOTE — Telephone Encounter (Signed)
Advanced Heart Failure Patient Advocate Encounter  Sent in [place] assistance application to [employer] via fax. Will follow up.

## 2022-08-11 NOTE — Progress Notes (Signed)
Carelink Summary Report / Loop Recorder 

## 2022-08-13 NOTE — Telephone Encounter (Signed)
Advanced Heart Failure Patient Advocate Encounter   Patient was approved to receive Jardiance from [place]   Effective dates: 08/13/22 through 11/25/22  Patient should receive first shipment in the next 3-5 days. Called and spoke with the patient. '  Charlann Boxer, CPhT

## 2022-08-17 DIAGNOSIS — Z6834 Body mass index (BMI) 34.0-34.9, adult: Secondary | ICD-10-CM | POA: Diagnosis not present

## 2022-08-17 DIAGNOSIS — Z0000 Encounter for general adult medical examination without abnormal findings: Secondary | ICD-10-CM | POA: Diagnosis not present

## 2022-08-17 DIAGNOSIS — N183 Chronic kidney disease, stage 3 unspecified: Secondary | ICD-10-CM | POA: Diagnosis not present

## 2022-08-17 DIAGNOSIS — I428 Other cardiomyopathies: Secondary | ICD-10-CM | POA: Diagnosis not present

## 2022-08-17 DIAGNOSIS — R7309 Other abnormal glucose: Secondary | ICD-10-CM | POA: Diagnosis not present

## 2022-08-17 DIAGNOSIS — D696 Thrombocytopenia, unspecified: Secondary | ICD-10-CM | POA: Diagnosis not present

## 2022-08-17 DIAGNOSIS — Z8739 Personal history of other diseases of the musculoskeletal system and connective tissue: Secondary | ICD-10-CM | POA: Diagnosis not present

## 2022-08-17 DIAGNOSIS — H6191 Disorder of right external ear, unspecified: Secondary | ICD-10-CM | POA: Diagnosis not present

## 2022-08-17 DIAGNOSIS — R778 Other specified abnormalities of plasma proteins: Secondary | ICD-10-CM | POA: Diagnosis not present

## 2022-08-17 DIAGNOSIS — Z23 Encounter for immunization: Secondary | ICD-10-CM | POA: Diagnosis not present

## 2022-08-17 DIAGNOSIS — I5020 Unspecified systolic (congestive) heart failure: Secondary | ICD-10-CM | POA: Diagnosis not present

## 2022-08-20 ENCOUNTER — Ambulatory Visit (INDEPENDENT_AMBULATORY_CARE_PROVIDER_SITE_OTHER): Payer: PPO

## 2022-08-20 DIAGNOSIS — I639 Cerebral infarction, unspecified: Secondary | ICD-10-CM

## 2022-08-20 LAB — CUP PACEART REMOTE DEVICE CHECK
Date Time Interrogation Session: 20230923000059
Implantable Pulse Generator Implant Date: 20220420

## 2022-08-30 NOTE — Progress Notes (Signed)
Carelink Summary Report / Loop Recorder 

## 2022-09-21 LAB — CUP PACEART REMOTE DEVICE CHECK
Date Time Interrogation Session: 20231026001259
Implantable Pulse Generator Implant Date: 20220420

## 2022-09-24 ENCOUNTER — Ambulatory Visit (INDEPENDENT_AMBULATORY_CARE_PROVIDER_SITE_OTHER): Payer: PPO

## 2022-09-24 DIAGNOSIS — I639 Cerebral infarction, unspecified: Secondary | ICD-10-CM

## 2022-09-26 ENCOUNTER — Ambulatory Visit: Payer: PPO | Attending: Cardiology

## 2022-09-26 DIAGNOSIS — I428 Other cardiomyopathies: Secondary | ICD-10-CM | POA: Diagnosis not present

## 2022-09-26 LAB — ECHOCARDIOGRAM LIMITED
Area-P 1/2: 2.49 cm2
S' Lateral: 5.3 cm

## 2022-09-26 MED ORDER — PERFLUTREN LIPID MICROSPHERE
1.0000 mL | INTRAVENOUS | Status: AC | PRN
  Administered 2022-09-26: 2 mL via INTRAVENOUS

## 2022-09-28 ENCOUNTER — Telehealth: Payer: Self-pay

## 2022-09-28 DIAGNOSIS — I5020 Unspecified systolic (congestive) heart failure: Secondary | ICD-10-CM

## 2022-09-28 NOTE — Telephone Encounter (Signed)
-----   Message from Kate Sable, MD sent at 09/27/2022  5:38 PM EDT ----- Repeat echocardiogram shows ejection fraction has stayed the same compared to prior at 30 to 35%.  Advised patient we will plan to obtain a cardiac MRI to get a more accurate ejection fraction.  If EF stays below 35%, we will plan to refer patient to EP for ICD consideration.  Please schedule cardiac MRI at patient's earliest convenience to evaluate EF, keep follow-up appointment with myself.

## 2022-09-28 NOTE — Telephone Encounter (Signed)
Called and spoke with patients wife per DPR on file. Discussed result note with patients wife. Informed her that someone will be reaching out to schedule patients MRI, and that after he is scheduled, he will need to go to the [facility] to get an H/H lab draw within 2 weeks of the test.   Patients wife verbalized understanding and agreed with plan.

## 2022-10-02 DIAGNOSIS — L570 Actinic keratosis: Secondary | ICD-10-CM | POA: Diagnosis not present

## 2022-10-02 DIAGNOSIS — D225 Melanocytic nevi of trunk: Secondary | ICD-10-CM | POA: Diagnosis not present

## 2022-10-02 DIAGNOSIS — D2271 Melanocytic nevi of right lower limb, including hip: Secondary | ICD-10-CM | POA: Diagnosis not present

## 2022-10-02 DIAGNOSIS — D2262 Melanocytic nevi of left upper limb, including shoulder: Secondary | ICD-10-CM | POA: Diagnosis not present

## 2022-10-02 DIAGNOSIS — D2261 Melanocytic nevi of right upper limb, including shoulder: Secondary | ICD-10-CM | POA: Diagnosis not present

## 2022-10-02 DIAGNOSIS — L814 Other melanin hyperpigmentation: Secondary | ICD-10-CM | POA: Diagnosis not present

## 2022-10-02 DIAGNOSIS — C4442 Squamous cell carcinoma of skin of scalp and neck: Secondary | ICD-10-CM | POA: Diagnosis not present

## 2022-10-02 DIAGNOSIS — D2272 Melanocytic nevi of left lower limb, including hip: Secondary | ICD-10-CM | POA: Diagnosis not present

## 2022-10-02 DIAGNOSIS — L821 Other seborrheic keratosis: Secondary | ICD-10-CM | POA: Diagnosis not present

## 2022-10-02 DIAGNOSIS — D485 Neoplasm of uncertain behavior of skin: Secondary | ICD-10-CM | POA: Diagnosis not present

## 2022-10-10 ENCOUNTER — Telehealth: Payer: Self-pay | Admitting: Cardiology

## 2022-10-10 NOTE — Telephone Encounter (Signed)
Patient dropped off PAF placed in box 

## 2022-10-17 ENCOUNTER — Telehealth: Payer: Self-pay | Admitting: Cardiology

## 2022-10-17 NOTE — Telephone Encounter (Signed)
Pt is requesting call back to get update on forms he dropped off at office.

## 2022-10-22 NOTE — Telephone Encounter (Signed)
[employer] and was told that they have not processed paperwork yet. I re-faxed application and will follow up again at the end of the week.

## 2022-10-22 NOTE — Telephone Encounter (Signed)
Patient is following up on the status of paperwork he dropped off.

## 2022-10-22 NOTE — Telephone Encounter (Signed)
Called and spoke with patients wife and informed her of the below documentation. Informed her that I will check back in on Friday of this week.

## 2022-10-23 NOTE — Progress Notes (Signed)
Carelink Summary Report / Loop Recorder 

## 2022-10-24 NOTE — Telephone Encounter (Signed)
Patient came into office and brought all requested documentation. Faxed over as requested. Patient was grateful for the follow up.

## 2022-10-24 NOTE — Telephone Encounter (Signed)
Received a fax from [employer] stating that it was missing the patient portion of the application. Patient had only dropped of the physician page to be filled out.   Called patient and left a detailed VM per DPR on file with this information and requested a call back.

## 2022-10-29 ENCOUNTER — Ambulatory Visit (INDEPENDENT_AMBULATORY_CARE_PROVIDER_SITE_OTHER): Payer: PPO

## 2022-10-29 DIAGNOSIS — I639 Cerebral infarction, unspecified: Secondary | ICD-10-CM

## 2022-10-29 DIAGNOSIS — C44222 Squamous cell carcinoma of skin of right ear and external auricular canal: Secondary | ICD-10-CM | POA: Diagnosis not present

## 2022-10-29 LAB — CUP PACEART REMOTE DEVICE CHECK
Date Time Interrogation Session: 20231204001113
Implantable Pulse Generator Implant Date: 20220420

## 2022-10-30 ENCOUNTER — Encounter: Payer: Self-pay | Admitting: Cardiology

## 2022-10-30 ENCOUNTER — Ambulatory Visit: Payer: PPO | Attending: Cardiology | Admitting: Cardiology

## 2022-10-30 ENCOUNTER — Other Ambulatory Visit
Admission: RE | Admit: 2022-10-30 | Discharge: 2022-10-30 | Disposition: A | Discharge status: Home / Self Care | Payer: PPO | Attending: Cardiology | Admitting: Cardiology

## 2022-10-30 ENCOUNTER — Telehealth: Payer: Self-pay

## 2022-10-30 VITALS — BP 118/70 | HR 61 | Ht 66.0 in | Wt 212.2 lb

## 2022-10-30 DIAGNOSIS — I428 Other cardiomyopathies: Secondary | ICD-10-CM | POA: Insufficient documentation

## 2022-10-30 DIAGNOSIS — Z8673 Personal history of transient ischemic attack (TIA), and cerebral infarction without residual deficits: Secondary | ICD-10-CM | POA: Diagnosis not present

## 2022-10-30 LAB — HEMOGLOBIN AND HEMATOCRIT, BLOOD
HCT: 47.7 % (ref 39.0–52.0)
Hemoglobin: 15.1 g/dL (ref 13.0–17.0)

## 2022-10-30 MED ORDER — SPIRONOLACTONE 25 MG PO TABS: 25.0000 mg | ORAL_TABLET | Freq: Every day | ORAL | 2 refills | Status: DC

## 2022-10-30 NOTE — Telephone Encounter (Signed)
Patient came in the office today to see Dr. Garen Lah. Patient reports that he called the company that he gets his Chrystie Nose through and they said his birthday was wrong.   Patient reports that he was told by them that a whole new application needed to be submitted.

## 2022-10-30 NOTE — Progress Notes (Addendum)
Cardiology Office Note:    Date:  10/30/2022   ID:  Gabriel Martin, DOB Sep 14, 1941, MRN [id number]  PCP:  [name], MD  [facility] HeartCare Cardiologist:  Kate Sable, MD  [facility] HeartCare Electrophysiologist:  None   Referring MD: [name], MD   Chief Complaint  Patient presents with   Follow-up    4 month follow up, no new cardiac concerns     History of Present Illness:    Gabriel Martin is a 81 y.o. male with a hx of hypertension, NICM, EF 30 to 35%, CVA 10/2019 s/p ILR who presents for follow-up.    Being seen for nonischemic cardiomyopathy, medication titration.  Denies chest pain, shortness of breath, edema.  Tolerating current medications as prescribed.  Taking both Entresto and losartan.  No evidence of A-fib or flutter on ILR.  Limited echo obtained last month showed EF 30 to 35%  Prior notes Echo 09/2022 EF 30 to 35% Echocardiogram 08/2021 EF 30 to 35% Patient presented to the ED on 12/8 due to confusion, lightheadedness and loss consciousness, In the ED, work-up with an MRI showed 2 acute left MCA territory cortical infarcts.   Echocardiogram 10/2020 moderate to severely reduced ejection fraction, EF 30 to 35%, global hypokinesis.   Left heart cath 01/10/2021 mild nonobstructive CAD. CMR 03/2021 EF 27%.  Mid wall LGE.  Past Medical History:  Diagnosis Date   Acid reflux 05/12/2015   Anemia    Arthritis    finger thumb   Benign neoplasm of large bowel    Benign prostatic hyperplasia with urinary obstruction 05/12/2015   Change in blood platelet count 05/12/2015   CHF (congestive heart failure) (HCC)    Chronic kidney disease 05/12/2015   GERD (gastroesophageal reflux disease)    HLD (hyperlipidemia) 05/12/2015   Hyperlipidemia    Nocturia associated with benign prostatic hypertrophy    Penile ulcer 05/12/2015   Skin cancer    Stroke ([place])    Thrombocytopenia [facility])     Past Surgical History:  Procedure Laterality Date   CATARACT  EXTRACTION W/PHACO Left 02/29/2016   Procedure: CATARACT EXTRACTION PHACO AND INTRAOCULAR LENS PLACEMENT ([place]) left;  Surgeon: Leandrew Koyanagi, MD;  Location: [place];  Service: Ophthalmology;  Laterality: Left;   CATARACT EXTRACTION W/PHACO Right 04/25/2016   Procedure: CATARACT EXTRACTION PHACO AND INTRAOCULAR LENS PLACEMENT ([facility]);  Surgeon: Leandrew Koyanagi, MD;  Location: [place];  Service: Ophthalmology;  Laterality: Right;   COLONOSCOPY WITH PROPOFOL N/A 07/01/2017   Procedure: COLONOSCOPY WITH PROPOFOL;  Surgeon: Manya Silvas, MD;  Location: [facility] ENDOSCOPY;  Service: Endoscopy;  Laterality: N/A;   EYE SURGERY     HEMORRHOID SURGERY     RIGHT/LEFT HEART CATH AND CORONARY ANGIOGRAPHY Bilateral 01/10/2021   Procedure: RIGHT/LEFT HEART CATH AND CORONARY ANGIOGRAPHY;  Surgeon: Nelva Bush, MD;  Location: [place] CV LAB;  Service: Cardiovascular;  Laterality: Bilateral;   TEE WITHOUT CARDIOVERSION N/A 11/30/2020   Procedure: TRANSESOPHAGEAL ECHOCARDIOGRAM (TEE);  Surgeon: Kate Sable, MD;  Location: [facility] ORS;  Service: Cardiovascular;  Laterality: N/A;    Current Medications: Current Meds  Medication Sig   acetaminophen (TYLENOL) 500 MG tablet Take 500 mg by mouth every 6 (six) hours as needed.   allopurinol (ZYLOPRIM) 100 MG tablet Take 100 mg by mouth daily.   aspirin EC 81 MG EC tablet Take 1 tablet (81 mg total) by mouth daily. Swallow whole.   atorvastatin (LIPITOR) 40 MG tablet Take 1 tablet (40 mg total) by  mouth daily at 6 PM.   empagliflozin (JARDIANCE) 10 MG TABS tablet Take 1 tablet by mouth daily before breakfast.   famotidine (PEPCID) 20 MG tablet Take 20 mg by mouth daily.   gabapentin (NEURONTIN) 100 MG capsule Take 100 mg by mouth daily.   JARDIANCE 10 MG TABS tablet TAKE 1 TABLET BY MOUTH DAILY BEFORE BREAKFAST.   metoprolol succinate (TOPROL-XL) 25 MG 24 hr tablet Take 1 tablet (25 mg total) by mouth 2 (two) times daily.    [DISCONTINUED] losartan (COZAAR) 50 MG tablet Take 1 tablet (50 mg total) by mouth daily.   [DISCONTINUED] spironolactone (ALDACTONE) 25 MG tablet Take 0.5 tablets (12.5 mg total) by mouth at bedtime.     Allergies:   Patient has no known allergies.   Social History   Socioeconomic History   Marital status: Married    Spouse name: Not on file   Number of children: Not on file   Years of education: Not on file   Highest education level: Not on file  Occupational History   Not on file  Tobacco Use   Smoking status: Former    Types: Cigarettes    Quit date: 11/26/1978    Years since quitting: 43.9   Smokeless tobacco: Never   Tobacco comments:    quit 40 years ago  Vaping Use   Vaping Use: Never used  Substance and Sexual Activity   Alcohol use: Yes    Alcohol/week: 6.0 standard drinks of alcohol    Types: 6 Cans of beer per week    Comment: occasional   Drug use: No   Sexual activity: Never  Other Topics Concern   Not on file  Social History Narrative   Not on file   Social Determinants of Health   Financial Resource Strain: Not on file  Food Insecurity: Not on file  Transportation Needs: Not on file  Physical Activity: Not on file  Stress: Not on file  Social Connections: Not on file     Family History: The patient's family history includes Heart attack in his mother; Heart attack (age of onset: 78) in his nephew; Heart attack (age of onset: 88) in his brother; Heart disease in his brother.  ROS:   Please see the history of present illness.     All other systems reviewed and are negative.  EKGs/Labs/Other Studies Reviewed:    The following studies were reviewed today:   EKG:  EKG is ordered today.  EKG shows normal sinus rhythm, occasional PVCs  Recent Labs: 02/05/2022: BUN 31; Creatinine, Ser 1.59; Potassium 5.2; Sodium 138  Recent Lipid Panel    Component Value Date/Time   CHOL 137 11/04/2020 0526   TRIG 68 11/04/2020 0526   HDL 48 11/04/2020 0526    CHOLHDL 2.9 11/04/2020 0526   VLDL 14 11/04/2020 0526   LDLCALC 75 11/04/2020 0526     Risk Assessment/Calculations:      Physical Exam:    VS:  BP 118/70 (BP Location: Left Arm, Patient Position: Sitting, Cuff Size: Normal)   Pulse 61   Ht '5\' 6"'$  (1.676 m)   Wt 212 lb 3.2 oz (96.3 kg)   SpO2 98%   BMI 34.25 kg/m     Wt Readings from Last 3 Encounters:  10/30/22 212 lb 3.2 oz (96.3 kg)  06/25/22 213 lb (96.6 kg)  05/08/22 211 lb (95.7 kg)     GEN:  Well nourished, well developed in no acute distress HEENT: Normal NECK: No JVD; No  carotid bruits CARDIAC: RRR, no murmurs, rubs, gallops RESPIRATORY:  Clear to auscultation without rales, wheezing or rhonchi  ABDOMEN: Soft, non-tender, non-distended MUSCULOSKELETAL:  No edema; No deformity  SKIN: Warm and dry NEUROLOGIC:  Alert and oriented x 3 PSYCHIATRIC:  Normal affect   ASSESSMENT:    1. NICM (nonischemic cardiomyopathy) ([place])   2. History of embolic stroke    PLAN:    In order of problems listed above:  Nonischemic cardiomyopathy, EF 30 to 35% .  Left heart cath 12/2020 with mild, non obstructive CAD.  Limited echo 09/2022 EF 30 to 35%.  NYHA class II symptoms.  Patient appears euvolemic.  Heart rate 61.  Continue Entresto to 97-103 mg twice daily, Toprol-XL 25 mg daily, Aldactone 12.5 mg daily,jardiance '10mg'$  qd.  Stop losartan, increase Aldactone to 25 mg daily.  Obtain CMR for more accurate assessment of EF.  Refer to EP for ICD consideration. Hx of CVA 10/2020, ILR with no evidence for A-fib or flutter so far.  Cont aspirin, Lipitor.  Follow-up in 2 months   Medication Adjustments/Labs and Tests Ordered: Current medicines are reviewed at length with the patient today.  Concerns regarding medicines are outlined above.  Orders Placed This Encounter  Procedures   MR CARDIAC MORPHOLOGY W WO CONTRAST   Hemoglobin and hematocrit, blood   Ambulatory referral to Cardiac Electrophysiology   EKG 12-Lead     Meds  ordered this encounter  Medications   spironolactone (ALDACTONE) 25 MG tablet    Sig: Take 1 tablet (25 mg total) by mouth at bedtime.    Dispense:  30 tablet    Refill:  2     Patient Instructions  Medication Instructions:   INCREASE spironolactone - take one tablet ('25mg'$ ) by mouth daily.    *If you need a refill on your cardiac medications before your next appointment, please call your pharmacy*   Lab Work:  None Ordered  If you have labs (blood work) drawn today and your tests are completely normal, you will receive your results only by: [place] (if you have MyChart) OR A paper copy in the mail If you have any lab test that is abnormal or we need to change your treatment, we will call you to review the results.   Testing/Procedures:    You are scheduled for Cardiac MRI on ______________. Please arrive for your appointment at ______________ ( arrive 30-45 minutes prior to test start time). ?  [facility] [place], [place] [address] [phone] Please take advantage of the free valet parking available at the MAIN entrance. Proceed to [facility] registration for check-in (first floor).  Magnetic resonance imaging (MRI) is a painless test that produces images of the inside of the body without using Xrays.  During an MRI, strong magnets and radio waves work together in a Research officer, political party to form detailed images.   MRI images may provide more details about a medical condition than X-rays, CT scans, and ultrasounds can provide.  You may be given earphones to listen for instructions.  You may eat a light breakfast and take medications as ordered with the exception of HCTZ (fluid pill, other). Please avoid stimulants for 12 hr prior to test. (Ie. Caffeine, nicotine, chocolate, or antihistamine medications)  If a contrast material will be used, an IV will be inserted into one of your veins. Contrast material will be injected into your  IV. It will leave your body through your urine within a day. You  may be told to drink plenty of fluids to help flush the contrast material out of your system.  You will be asked to remove all metal, including: Watch, jewelry, and other metal objects including hearing aids, hair pieces and dentures. Also wearable glucose monitoring systems (ie. Freestyle Libre and Omnipods) (Braces and fillings normally are not a problem.)   TEST WILL TAKE APPROXIMATELY 1 HOUR  PLEASE NOTIFY SCHEDULING AT LEAST 24 HOURS IN ADVANCE IF YOU ARE UNABLE TO KEEP YOUR APPOINTMENT. [phone]  Please call Marchia Bond, cardiac imaging nurse navigator with any questions/concerns. Marchia Bond RN Navigator Cardiac Imaging Gordy Clement RN Navigator Cardiac Imaging [facility] [phone] Office     Follow-Up: At [facility], you and your health needs are our priority.  As part of our continuing mission to provide you with exceptional heart care, we have created designated Provider Care Teams.  These Care Teams include your primary Cardiologist (physician) and Advanced Practice Providers (APPs -  Physician Assistants and Nurse Practitioners) who all work together to provide you with the care you need, when you need it.  We recommend signing up for the patient portal called "MyChart".  Sign up information is provided on this After Visit Summary.  MyChart is used to connect with patients for Virtual Visits (Telemedicine).  Patients are able to view lab/test results, encounter notes, upcoming appointments, etc.  Non-urgent messages can be sent to your provider as well.   To learn more about what you can do with MyChart, go to [email or web address].    Your next appointment:   2 month(s)  The format for your next appointment:   In Person  Provider:   You may see Kate Sable, MD or one of the following Advanced Practice Providers on your designated Care Team:    Murray Hodgkins, NP Christell Faith, PA-C Cadence Kathlen Mody, PA-C Gerrie Nordmann, NP            Signed, Kate Sable, MD  10/30/2022 9:26 AM    [place]

## 2022-10-30 NOTE — Telephone Encounter (Signed)
The application I received was filled in and looks like whoever filled it in, put the wrong year of birth, its [birth year] on the app. I am resending the application now. Would advise the patient to call back and use the 1943 DOB while we get another application in progress.

## 2022-10-30 NOTE — Patient Instructions (Signed)
Medication Instructions:   INCREASE spironolactone - take one tablet ('25mg'$ ) by mouth daily.    *If you need a refill on your cardiac medications before your next appointment, please call your pharmacy*   Lab Work:  None Ordered  If you have labs (blood work) drawn today and your tests are completely normal, you will receive your results only by: [facility] (if you have MyChart) OR A paper copy in the mail If you have any lab test that is abnormal or we need to change your treatment, we will call you to review the results.   Testing/Procedures:    You are scheduled for Cardiac MRI on ______________. Please arrive for your appointment at ______________ ( arrive 30-45 minutes prior to test start time). ?  [facility] [address] [place], [place] [address] [phone] Please take advantage of the free valet parking available at the MAIN entrance. Proceed to [facility] registration for check-in (first floor).  Magnetic resonance imaging (MRI) is a painless test that produces images of the inside of the body without using Xrays.  During an MRI, strong magnets and radio waves work together in a Research officer, political party to form detailed images.   MRI images may provide more details about a medical condition than X-rays, CT scans, and ultrasounds can provide.  You may be given earphones to listen for instructions.  You may eat a light breakfast and take medications as ordered with the exception of HCTZ (fluid pill, other). Please avoid stimulants for 12 hr prior to test. (Ie. Caffeine, nicotine, chocolate, or antihistamine medications)  If a contrast material will be used, an IV will be inserted into one of your veins. Contrast material will be injected into your IV. It will leave your body through your urine within a day. You may be told to drink plenty of fluids to help flush the contrast material out of your system.  You will be asked to remove all metal,  including: Watch, jewelry, and other metal objects including hearing aids, hair pieces and dentures. Also wearable glucose monitoring systems (ie. Freestyle Libre and Omnipods) (Braces and fillings normally are not a problem.)   TEST WILL TAKE APPROXIMATELY 1 HOUR  PLEASE NOTIFY SCHEDULING AT LEAST 24 HOURS IN ADVANCE IF YOU ARE UNABLE TO KEEP YOUR APPOINTMENT. [phone]  Please call Marchia Bond, cardiac imaging nurse navigator with any questions/concerns. Marchia Bond RN Navigator Cardiac Imaging Gordy Clement RN Navigator Cardiac Imaging [facility] [phone] Office     Follow-Up: At [facility], you and your health needs are our priority.  As part of our continuing mission to provide you with exceptional heart care, we have created designated Provider Care Teams.  These Care Teams include your primary Cardiologist (physician) and Advanced Practice Providers (APPs -  Physician Assistants and Nurse Practitioners) who all work together to provide you with the care you need, when you need it.  We recommend signing up for the patient portal called "MyChart".  Sign up information is provided on this After Visit Summary.  MyChart is used to connect with patients for Virtual Visits (Telemedicine).  Patients are able to view lab/test results, encounter notes, upcoming appointments, etc.  Non-urgent messages can be sent to your provider as well.   To learn more about what you can do with MyChart, go to [email or web address].    Your next appointment:   2 month(s)  The format for your next appointment:   In Person  Provider:  You may see Kate Sable, MD or one of the following Advanced Practice Providers on your designated Care Team:   Murray Hodgkins, NP Christell Faith, PA-C Cadence Kathlen Mody, PA-C Gerrie Nordmann, NP

## 2022-11-02 ENCOUNTER — Other Ambulatory Visit (HOSPITAL_COMMUNITY): Payer: Self-pay

## 2022-11-13 DIAGNOSIS — C44222 Squamous cell carcinoma of skin of right ear and external auricular canal: Secondary | ICD-10-CM | POA: Diagnosis not present

## 2022-11-13 DIAGNOSIS — L905 Scar conditions and fibrosis of skin: Secondary | ICD-10-CM | POA: Diagnosis not present

## 2022-11-13 DIAGNOSIS — C4442 Squamous cell carcinoma of skin of scalp and neck: Secondary | ICD-10-CM | POA: Diagnosis not present

## 2022-11-13 DIAGNOSIS — Z481 Encounter for planned postprocedural wound closure: Secondary | ICD-10-CM | POA: Diagnosis not present

## 2022-11-22 ENCOUNTER — Telehealth: Payer: Self-pay | Admitting: Cardiology

## 2022-11-22 NOTE — Telephone Encounter (Signed)
Pt c/o medication issue:  1. Name of Medication:   JARDIANCE 10 MG TABS tablet   2. How are you currently taking this medication (dosage and times per day)? As prescribed  3. Are you having a reaction (difficulty breathing--STAT)?   No  4. What is your medication issue?   Patient is following up on his application for assistance to this medication.

## 2022-11-23 ENCOUNTER — Telehealth (HOSPITAL_COMMUNITY): Payer: Self-pay

## 2022-11-23 NOTE — Telephone Encounter (Signed)
He called and left message inquiring about status of medication assistance from December 5th. Can you follow up?

## 2022-11-27 ENCOUNTER — Telehealth (HOSPITAL_COMMUNITY): Payer: Self-pay

## 2022-11-27 NOTE — Telephone Encounter (Signed)
Patient is following up, again requesting an update on patient assistance for Jardiance.

## 2022-11-27 NOTE — Telephone Encounter (Signed)
Heart Failure Patient Advocate Encounter  Spoke to patient on the phone; he stated there was an issue with DOB on file when trying to renew his Jardiance in December. As of 11/25/22, previous assistance is no longer effective. Discussed renewal forms for [facility], will mail paperwork to patient to sign, and he will return to AHF by mail, or have [place] scan the forms so that I can continue the process.  Will follow up.  Clista Bernhardt, CPhT Rx Patient Advocate Phone: [phone]

## 2022-11-27 NOTE — Telephone Encounter (Signed)
Patient has been called and made aware that it will be looked into. Message routed.

## 2022-11-29 ENCOUNTER — Other Ambulatory Visit (HOSPITAL_COMMUNITY): Payer: Self-pay | Admitting: Cardiology

## 2022-11-29 ENCOUNTER — Other Ambulatory Visit (HOSPITAL_COMMUNITY): Payer: Self-pay

## 2022-11-29 MED ORDER — EMPAGLIFLOZIN 10 MG PO TABS: 10.0000 mg | ORAL_TABLET | Freq: Every day | ORAL | 3 refills | Status: DC

## 2022-11-29 NOTE — Telephone Encounter (Signed)
Advanced Heart Failure Patient Advocate Encounter  The patient was approved for a [employer] grant that will help cover the cost of Jardiance.  Total amount awarded, $10,000.  Effective: 10/30/22 - 10/30/23.  BIN [id number] PCN [id number] [id number] ID [id number]  New prescription(s) sent to [facility]. Patient provided with approval and processing information via [employer].  Clista Bernhardt, CPhT Rx Patient Advocate Phone: [phone]

## 2022-11-29 NOTE — Telephone Encounter (Signed)
Advanced Heart Failure Patient Advocate Encounter  Discussed further with patient, as he is eligible for a grant that is currently open and would cover the cost of this medication. Will submit for grant and update encounter.

## 2022-12-03 ENCOUNTER — Ambulatory Visit (INDEPENDENT_AMBULATORY_CARE_PROVIDER_SITE_OTHER): Payer: PPO

## 2022-12-03 DIAGNOSIS — I428 Other cardiomyopathies: Secondary | ICD-10-CM

## 2022-12-03 LAB — CUP PACEART REMOTE DEVICE CHECK
Date Time Interrogation Session: 20240108000941
Implantable Pulse Generator Implant Date: 20220420

## 2022-12-05 NOTE — Progress Notes (Signed)
Carelink Summary Report / Loop Recorder 

## 2022-12-13 ENCOUNTER — Telehealth: Payer: Self-pay | Admitting: Cardiology

## 2022-12-13 NOTE — Telephone Encounter (Signed)
-----  Message from [name], [place] sent at 12/12/2022  2:42 PM EST ----- Gabriel Martin,  It looks like they scheduled patients Cardiac MRI after his follow up appt. Can we try to move patients follow up appt after his Cardiac MRI please.   Thanks !

## 2022-12-13 NOTE — Telephone Encounter (Signed)
LMOV to reschedule follow up after MRI

## 2022-12-19 ENCOUNTER — Encounter: Payer: PPO | Admitting: Cardiology

## 2022-12-25 NOTE — Progress Notes (Unsigned)
Electrophysiology Office Follow up Visit Note:    Date:  12/26/2022   ID:  Gabriel Martin, DOB Jun 02, 1941, MRN [id number]  PCP:  [name], MD  [facility] Cardiologist:  Kate Sable, MD  [facility] : [facility] Electrophysiologist:  Vickie Epley, MD    Interval History:    Gabriel Martin is a 82 y.o. male who presents for a follow up visit.  I last saw the patient after cryptogenic stroke to implant implantable loop recorder.  The patient follows with Dr. Garen Lah and saw him October 30, 2022.  The patient carries a diagnosis of nonischemic cardiomyopathy with an ejection fraction of 30 to 35%.   The patient is with his wife today in clinic.  He tells me that when he stands up from tying his shoes he will feel lightheaded for a few seconds.  Similarly, when he stands up at night to go use the restroom he will feel lightheaded.  After a few seconds the symptoms resolve and he is able to stay active.  No syncope.      Past Medical History:  Diagnosis Date   Acid reflux 05/12/2015   Anemia    Arthritis    finger thumb   Benign neoplasm of large bowel    Benign prostatic hyperplasia with urinary obstruction 05/12/2015   Change in blood platelet count 05/12/2015   CHF (congestive heart failure) (HCC)    Chronic kidney disease 05/12/2015   GERD (gastroesophageal reflux disease)    HLD (hyperlipidemia) 05/12/2015   Hyperlipidemia    Nocturia associated with benign prostatic hypertrophy    Penile ulcer 05/12/2015   Skin cancer    Stroke ([place])    Thrombocytopenia [facility])     Past Surgical History:  Procedure Laterality Date   CATARACT EXTRACTION W/PHACO Left 02/29/2016   Procedure: CATARACT EXTRACTION PHACO AND INTRAOCULAR LENS PLACEMENT ([place]) left;  Surgeon: Leandrew Koyanagi, MD;  Location: [place];  Service: Ophthalmology;  Laterality: Left;   CATARACT EXTRACTION W/PHACO Right 04/25/2016   Procedure: CATARACT EXTRACTION PHACO AND INTRAOCULAR LENS  PLACEMENT (IOC);  Surgeon: Leandrew Koyanagi, MD;  Location: [place];  Service: Ophthalmology;  Laterality: Right;   COLONOSCOPY WITH PROPOFOL N/A 07/01/2017   Procedure: COLONOSCOPY WITH PROPOFOL;  Surgeon: Manya Silvas, MD;  Location: [facility] ENDOSCOPY;  Service: Endoscopy;  Laterality: N/A;   EYE SURGERY     HEMORRHOID SURGERY     RIGHT/LEFT HEART CATH AND CORONARY ANGIOGRAPHY Bilateral 01/10/2021   Procedure: RIGHT/LEFT HEART CATH AND CORONARY ANGIOGRAPHY;  Surgeon: Nelva Bush, MD;  Location: [place] CV LAB;  Service: Cardiovascular;  Laterality: Bilateral;   TEE WITHOUT CARDIOVERSION N/A 11/30/2020   Procedure: TRANSESOPHAGEAL ECHOCARDIOGRAM (TEE);  Surgeon: Kate Sable, MD;  Location: [facility] ORS;  Service: Cardiovascular;  Laterality: N/A;    Current Medications: Current Meds  Medication Sig   acetaminophen (TYLENOL) 500 MG tablet Take 500 mg by mouth every 6 (six) hours as needed.   allopurinol (ZYLOPRIM) 100 MG tablet Take 100 mg by mouth daily.   aspirin EC 81 MG EC tablet Take 1 tablet (81 mg total) by mouth daily. Swallow whole.   atorvastatin (LIPITOR) 40 MG tablet Take 1 tablet (40 mg total) by mouth daily at 6 PM.   empagliflozin (JARDIANCE) 10 MG TABS tablet Take 1 tablet by mouth daily before breakfast.   famotidine (PEPCID) 20 MG tablet Take 20 mg by mouth daily.   gabapentin (NEURONTIN) 100 MG capsule Take 100 mg by mouth daily.  metoprolol succinate (TOPROL-XL) 25 MG 24 hr tablet Take 1 tablet (25 mg total) by mouth 2 (two) times daily. (Patient taking differently: Take 25 mg by mouth at bedtime.)   sacubitril-valsartan (ENTRESTO) 97-103 MG Take 1 tablet by mouth 2 (two) times daily.   spironolactone (ALDACTONE) 25 MG tablet Take 1 tablet (25 mg total) by mouth at bedtime.     Allergies:   Patient has no known allergies.   Social History   Socioeconomic History   Marital status: Married    Spouse name: Not on file   Number of children:  Not on file   Years of education: Not on file   Highest education level: Not on file  Occupational History   Not on file  Tobacco Use   Smoking status: Former    Types: Cigarettes    Quit date: 11/26/1978    Years since quitting: 44.1   Smokeless tobacco: Never   Tobacco comments:    quit 40 years ago  Vaping Use   Vaping Use: Never used  Substance and Sexual Activity   Alcohol use: Yes    Alcohol/week: 6.0 standard drinks of alcohol    Types: 6 Cans of beer per week    Comment: occasional   Drug use: No   Sexual activity: Never  Other Topics Concern   Not on file  Social History Narrative   Not on file   Social Determinants of Health   Financial Resource Strain: Not on file  Food Insecurity: Not on file  Transportation Needs: Not on file  Physical Activity: Not on file  Stress: Not on file  Social Connections: Not on file     Family History: The patient's family history includes Heart attack in his mother; Heart attack (age of onset: 2) in his nephew; Heart attack (age of onset: 2) in his brother; Heart disease in his brother.  ROS:   Please see the history of present illness.    All other systems reviewed and are negative.  EKGs/Labs/Other Studies Reviewed:    The following studies were reviewed today:  Loop recorder interrogations have demonstrated no atrial fibrillation   EKG: October 30, 2022 EKG shows sinus rhythm  Recent Labs: 02/05/2022: BUN 31; Creatinine, Ser 1.59; Potassium 5.2; Sodium 138 10/30/2022: Hemoglobin 15.1  Recent Lipid Panel    Component Value Date/Time   CHOL 137 11/04/2020 0526   TRIG 68 11/04/2020 0526   HDL 48 11/04/2020 0526   CHOLHDL 2.9 11/04/2020 0526   VLDL 14 11/04/2020 0526   LDLCALC 75 11/04/2020 0526    Physical Exam:    VS:  BP 108/60   Pulse 64   Ht '5\' 6"'$  (1.676 m)   Wt 213 lb (96.6 kg)   SpO2 95%   BMI 34.38 kg/m     Wt Readings from Last 3 Encounters:  12/26/22 213 lb (96.6 kg)  10/30/22 212 lb 3.2 oz  (96.3 kg)  06/25/22 213 lb (96.6 kg)     GEN:  Well nourished, well developed in no acute distress.  Elderly CARDIAC: RRR, no murmurs, rubs, gallops RESPIRATORY:  Clear to auscultation without rales, wheezing or rhonchi        ASSESSMENT:    1. HFrEF (heart failure with reduced ejection fraction) ([place])   2. NICM (nonischemic cardiomyopathy) ([place])   3. History of embolic stroke    PLAN:    In order of problems listed above:  #Chronic systolic heart failure NYHA class II.  Follows with Dr. Garen Lah.  Last EF 30 to 35%.  Cardiac MRI pending.  On Jardiance and metoprolol and spironolactone.  Long discussion with patient during today's clinic appointment regarding ICD therapy, indications, contraindications, limited data in octogenarians and beyond.  I would not recommend ICD implant for this patient.  Recommend continuing aggressive GDMT.  #PVCs I will turn on the PVC counter on his loop recorder today.  #Cryptogenic stroke Loop recorder in place without evidence of atrial fibrillation thus far  Follow-up 1 year with APP.  Medication Adjustments/Labs and Tests Ordered: Current medicines are reviewed at length with the patient today.  Concerns regarding medicines are outlined above.  No orders of the defined types were placed in this encounter.  No orders of the defined types were placed in this encounter.    Signed, Lars Mage, MD, [facility], [facility] 12/26/2022 10:23 AM    Electrophysiology [facility]

## 2022-12-26 ENCOUNTER — Encounter: Payer: Self-pay | Admitting: Cardiology

## 2022-12-26 ENCOUNTER — Ambulatory Visit: Payer: PPO | Attending: Cardiology | Admitting: Cardiology

## 2022-12-26 VITALS — BP 108/60 | HR 64 | Ht 66.0 in | Wt 213.0 lb

## 2022-12-26 DIAGNOSIS — Z8673 Personal history of transient ischemic attack (TIA), and cerebral infarction without residual deficits: Secondary | ICD-10-CM

## 2022-12-26 DIAGNOSIS — I428 Other cardiomyopathies: Secondary | ICD-10-CM

## 2022-12-26 DIAGNOSIS — I5020 Unspecified systolic (congestive) heart failure: Secondary | ICD-10-CM | POA: Diagnosis not present

## 2022-12-26 NOTE — Patient Instructions (Signed)
Medication Instructions:  Your physician recommends that you continue on your current medications as directed. Please refer to the Current Medication list given to you today.  *If you need a refill on your cardiac medications before your next appointment, please call your pharmacy*  Follow-Up: At [facility], you and your health needs are our priority.  As part of our continuing mission to provide you with exceptional heart care, we have created designated Provider Care Teams.  These Care Teams include your primary Cardiologist (physician) and Advanced Practice Providers (APPs -  Physician Assistants and Nurse Practitioners) who all work together to provide you with the care you need, when you need it.  Your next appointment:   1 year(s)  Provider:   You will see one of the following Advanced Practice Providers on your designated Care Team:   Renee Ursuy, PA-C Michael "Andy" Tillery, PA-C Suzann Riddle, NP  

## 2022-12-31 ENCOUNTER — Ambulatory Visit: Payer: PPO | Admitting: Cardiology

## 2023-01-01 ENCOUNTER — Telehealth (HOSPITAL_COMMUNITY): Payer: Self-pay | Admitting: *Deleted

## 2023-01-01 NOTE — Telephone Encounter (Signed)
Patient returning call regarding upcoming cardiac imaging study; pt verbalizes understanding of appt date/time, parking situation and where to check in, and verified current allergies; name and call back number provided for further questions should they arise  Gabriel Clement RN Navigator Cardiac [place] [facility] [phone] office [phone] cell  Patient denies metal or claustrophobia.

## 2023-01-01 NOTE — Telephone Encounter (Signed)
Attempted to call patient regarding upcoming cardiac MRI appointment. Left message on voicemail with name and callback number  Dwight Burdo RN Navigator Cardiac Imaging [facility] [phone] Office [phone] Cell  

## 2023-01-02 ENCOUNTER — Ambulatory Visit
Admission: RE | Admit: 2023-01-02 | Discharge: 2023-01-02 | Disposition: A | Discharge status: Home / Self Care | Payer: PPO | Source: Ambulatory Visit | Attending: Cardiology | Admitting: Cardiology

## 2023-01-02 ENCOUNTER — Other Ambulatory Visit: Payer: Self-pay | Admitting: Cardiology

## 2023-01-02 DIAGNOSIS — I428 Other cardiomyopathies: Secondary | ICD-10-CM

## 2023-01-02 MED ORDER — GADOBUTROL 1 MMOL/ML IV SOLN
13.0000 mL | Freq: Once | INTRAVENOUS | Status: AC | PRN
  Administered 2023-01-02: 13 mL via INTRAVENOUS

## 2023-01-04 NOTE — Progress Notes (Signed)
Carelink Summary Report / Loop Recorder 

## 2023-01-06 LAB — CUP PACEART REMOTE DEVICE CHECK
Date Time Interrogation Session: 20240210000923
Implantable Pulse Generator Implant Date: 20220420

## 2023-01-07 ENCOUNTER — Ambulatory Visit: Payer: PPO

## 2023-01-07 DIAGNOSIS — I639 Cerebral infarction, unspecified: Secondary | ICD-10-CM | POA: Diagnosis not present

## 2023-01-09 ENCOUNTER — Ambulatory Visit: Payer: PPO | Attending: Cardiology | Admitting: Cardiology

## 2023-01-09 ENCOUNTER — Encounter: Payer: Self-pay | Admitting: Cardiology

## 2023-01-09 VITALS — BP 90/66 | HR 67 | Ht 66.0 in | Wt 209.0 lb

## 2023-01-09 DIAGNOSIS — Z8673 Personal history of transient ischemic attack (TIA), and cerebral infarction without residual deficits: Secondary | ICD-10-CM | POA: Diagnosis not present

## 2023-01-09 DIAGNOSIS — I428 Other cardiomyopathies: Secondary | ICD-10-CM | POA: Diagnosis not present

## 2023-01-09 MED ORDER — SPIRONOLACTONE 25 MG PO TABS: 12.5000 mg | ORAL_TABLET | Freq: Every day | ORAL | 3 refills | Status: DC

## 2023-01-09 NOTE — Patient Instructions (Signed)
Medication Instructions:   DECREASE Spirolactone - take 0.5 tablet by mouth daily.   *If you need a refill on your cardiac medications before your next appointment, please call your pharmacy*   Lab Work:  None Ordered  If you have labs (blood work) drawn today and your tests are completely normal, you will receive your results only by: [place] (if you have MyChart) OR A paper copy in the mail If you have any lab test that is abnormal or we need to change your treatment, we will call you to review the results.   Testing/Procedures:  None ordered   Follow-Up: At [facility], you and your health needs are our priority.  As part of our continuing mission to provide you with exceptional heart care, we have created designated Provider Care Teams.  These Care Teams include your primary Cardiologist (physician) and Advanced Practice Providers (APPs -  Physician Assistants and Nurse Practitioners) who all work together to provide you with the care you need, when you need it.  We recommend signing up for the patient portal called "MyChart".  Sign up information is provided on this After Visit Summary.  MyChart is used to connect with patients for Virtual Visits (Telemedicine).  Patients are able to view lab/test results, encounter notes, upcoming appointments, etc.  Non-urgent messages can be sent to your provider as well.   To learn more about what you can do with MyChart, go to [email or web address].    Your next appointment:   6 month(s)  Provider:   You may see Kate Sable, MD or one of the following Advanced Practice Providers on your designated Care Team:   Murray Hodgkins, NP Christell Faith, PA-C Cadence Kathlen Mody, PA-C Gerrie Nordmann, NP

## 2023-01-09 NOTE — Progress Notes (Signed)
Cardiology Office Note:    Date:  01/09/2023   ID:  [name], DOB 1941-02-05, MRN [id number]  PCP:  [name], MD  [facility] Cardiologist:  Kate Sable, MD  [facility] Electrophysiologist:  Vickie Epley, MD   Referring MD: [name], MD   No chief complaint on file.   History of Present Illness:    Gabriel Martin is a 82 y.o. male with a hx of hypertension, NICM, EF 30 to 35%, CVA 10/2019 s/p ILR who presents for follow-up.    Had a cardiac MRI to evaluate ejection fraction, EF was 31%.  States feeling dizzy sometimes when he bends over.  Aldactone previously increased to 25 mg daily.  Was evaluated by EP, regarding AICD consideration.  ICD not recommended due to patient's age, and lack of data in patients over 53.  Otherwise feels well, denies chest pain, shortness of breath, edema.  Compliant with medications as prescribed.   Prior notes CMR 2/24 EF 31% Echo 09/2022 EF 30 to 35% Echocardiogram 08/2021 EF 30 to 35% Patient presented to the ED on 12/8 due to confusion, lightheadedness and loss consciousness, In the ED, work-up with an MRI showed 2 acute left MCA territory cortical infarcts.   Echocardiogram 10/2020 moderate to severely reduced ejection fraction, EF 30 to 35%, global hypokinesis.   Left heart cath 01/10/2021 mild nonobstructive CAD. CMR 03/2021 EF 27%.  Mid wall LGE.  Past Medical History:  Diagnosis Date   Acid reflux 05/12/2015   Anemia    Arthritis    finger thumb   Benign neoplasm of large bowel    Benign prostatic hyperplasia with urinary obstruction 05/12/2015   Change in blood platelet count 05/12/2015   CHF (congestive heart failure) (HCC)    Chronic kidney disease 05/12/2015   GERD (gastroesophageal reflux disease)    HLD (hyperlipidemia) 05/12/2015   Hyperlipidemia    Nocturia associated with benign prostatic hypertrophy    Penile ulcer 05/12/2015   Skin cancer    Stroke ([place])    Thrombocytopenia [facility])      Past Surgical History:  Procedure Laterality Date   CATARACT EXTRACTION W/PHACO Left 02/29/2016   Procedure: CATARACT EXTRACTION PHACO AND INTRAOCULAR LENS PLACEMENT ([place]) left;  Surgeon: Leandrew Koyanagi, MD;  Location: [place];  Service: Ophthalmology;  Laterality: Left;   CATARACT EXTRACTION W/PHACO Right 04/25/2016   Procedure: CATARACT EXTRACTION PHACO AND INTRAOCULAR LENS PLACEMENT (IOC);  Surgeon: Leandrew Koyanagi, MD;  Location: [place];  Service: Ophthalmology;  Laterality: Right;   COLONOSCOPY WITH PROPOFOL N/A 07/01/2017   Procedure: COLONOSCOPY WITH PROPOFOL;  Surgeon: Manya Silvas, MD;  Location: [facility] ENDOSCOPY;  Service: Endoscopy;  Laterality: N/A;   EYE SURGERY     HEMORRHOID SURGERY     RIGHT/LEFT HEART CATH AND CORONARY ANGIOGRAPHY Bilateral 01/10/2021   Procedure: RIGHT/LEFT HEART CATH AND CORONARY ANGIOGRAPHY;  Surgeon: Nelva Bush, MD;  Location: [place] CV LAB;  Service: Cardiovascular;  Laterality: Bilateral;   TEE WITHOUT CARDIOVERSION N/A 11/30/2020   Procedure: TRANSESOPHAGEAL ECHOCARDIOGRAM (TEE);  Surgeon: Kate Sable, MD;  Location: [facility] ORS;  Service: Cardiovascular;  Laterality: N/A;    Current Medications: Current Meds  Medication Sig   acetaminophen (TYLENOL) 500 MG tablet Take 500 mg by mouth every 6 (six) hours as needed.   allopurinol (ZYLOPRIM) 100 MG tablet Take 100 mg by mouth daily.   aspirin EC 81 MG EC tablet Take 1 tablet (81 mg total) by mouth daily. Swallow whole.  atorvastatin (LIPITOR) 40 MG tablet Take 1 tablet (40 mg total) by mouth daily at 6 PM.   empagliflozin (JARDIANCE) 10 MG TABS tablet Take 1 tablet by mouth daily before breakfast.   famotidine (PEPCID) 20 MG tablet Take 20 mg by mouth daily.   gabapentin (NEURONTIN) 100 MG capsule Take 100 mg by mouth daily.   metoprolol succinate (TOPROL-XL) 25 MG 24 hr tablet Take 1 tablet (25 mg total) by mouth 2 (two) times daily. (Patient  taking differently: Take 25 mg by mouth at bedtime.)   sacubitril-valsartan (ENTRESTO) 97-103 MG Take 1 tablet by mouth 2 (two) times daily.   [DISCONTINUED] spironolactone (ALDACTONE) 25 MG tablet Take 1 tablet (25 mg total) by mouth at bedtime.     Allergies:   Patient has no known allergies.   Social History   Socioeconomic History   Marital status: Married    Spouse name: Not on file   Number of children: Not on file   Years of education: Not on file   Highest education level: Not on file  Occupational History   Not on file  Tobacco Use   Smoking status: Former    Types: Cigarettes    Quit date: 11/26/1978    Years since quitting: 44.1   Smokeless tobacco: Never   Tobacco comments:    quit 40 years ago  Vaping Use   Vaping Use: Never used  Substance and Sexual Activity   Alcohol use: Yes    Alcohol/week: 6.0 standard drinks of alcohol    Types: 6 Cans of beer per week    Comment: occasional   Drug use: No   Sexual activity: Never  Other Topics Concern   Not on file  Social History Narrative   Not on file   Social Determinants of Health   Financial Resource Strain: Not on file  Food Insecurity: Not on file  Transportation Needs: Not on file  Physical Activity: Not on file  Stress: Not on file  Social Connections: Not on file     Family History: The patient's family history includes Heart attack in his mother; Heart attack (age of onset: 60) in his nephew; Heart attack (age of onset: 18) in his brother; Heart disease in his brother.  ROS:   Please see the history of present illness.     All other systems reviewed and are negative.  EKGs/Labs/Other Studies Reviewed:    The following studies were reviewed today:   EKG:  EKG is ordered today.  EKG shows normal sinus rhythm, occasional PVCs  Recent Labs: 02/05/2022: BUN 31; Creatinine, Ser 1.59; Potassium 5.2; Sodium 138 10/30/2022: Hemoglobin 15.1  Recent Lipid Panel    Component Value Date/Time   CHOL  137 11/04/2020 0526   TRIG 68 11/04/2020 0526   HDL 48 11/04/2020 0526   CHOLHDL 2.9 11/04/2020 0526   VLDL 14 11/04/2020 0526   LDLCALC 75 11/04/2020 0526     Risk Assessment/Calculations:      Physical Exam:    VS:  BP 90/66   Pulse 67   Ht 5' 6"$  (1.676 m)   Wt 209 lb (94.8 kg)   BMI 33.73 kg/m     Wt Readings from Last 3 Encounters:  01/09/23 209 lb (94.8 kg)  12/26/22 213 lb (96.6 kg)  10/30/22 212 lb 3.2 oz (96.3 kg)     GEN:  Well nourished, well developed in no acute distress HEENT: Normal NECK: No JVD; No carotid bruits CARDIAC: RRR, no murmurs, rubs, gallops  RESPIRATORY:  Clear to auscultation without rales, wheezing or rhonchi  ABDOMEN: Soft, non-tender, non-distended MUSCULOSKELETAL:  No edema; No deformity  SKIN: Warm and dry NEUROLOGIC:  Alert and oriented x 3 PSYCHIATRIC:  Normal affect   ASSESSMENT:    1. NICM (nonischemic cardiomyopathy) ([place])   2. History of embolic stroke    PLAN:    In order of problems listed above:  Nonischemic cardiomyopathy, EF 30 to 35% .  CMR 2/24 EF 31%.  Left heart cath 12/2020 with mild, non obstructive CAD.  Limited echo 09/2022 EF 30 to 35%.  NYHA class II symptoms.  Patient appears euvolemic.  Heart rate 61.  BP low normal.  Reduce Aldactone to 12.5 mg daily.  Continue Entresto 97-103 mg twice daily, Toprol-XL 25 mg daily, jardiance 10 mg qd. Evaluated by EP, not much data suggesting benefits of ICD in octogenarians. Hx of CVA 10/2020, ILR with no evidence for A-fib or flutter so far.  Cont aspirin, Lipitor.  Follow-up in 6 months   Medication Adjustments/Labs and Tests Ordered: Current medicines are reviewed at length with the patient today.  Concerns regarding medicines are outlined above.  Orders Placed This Encounter  Procedures   EKG 12-Lead     Meds ordered this encounter  Medications   spironolactone (ALDACTONE) 25 MG tablet    Sig: Take 0.5 tablets (12.5 mg total) by mouth at bedtime.    Dispense:   45 tablet    Refill:  3     Patient Instructions  Medication Instructions:   DECREASE Spirolactone - take 0.5 tablet by mouth daily.   *If you need a refill on your cardiac medications before your next appointment, please call your pharmacy*   Lab Work:  None Ordered  If you have labs (blood work) drawn today and your tests are completely normal, you will receive your results only by: [place] (if you have MyChart) OR A paper copy in the mail If you have any lab test that is abnormal or we need to change your treatment, we will call you to review the results.   Testing/Procedures:  None ordered   Follow-Up: At [facility], you and your health needs are our priority.  As part of our continuing mission to provide you with exceptional heart care, we have created designated Provider Care Teams.  These Care Teams include your primary Cardiologist (physician) and Advanced Practice Providers (APPs -  Physician Assistants and Nurse Practitioners) who all work together to provide you with the care you need, when you need it.  We recommend signing up for the patient portal called "MyChart".  Sign up information is provided on this After Visit Summary.  MyChart is used to connect with patients for Virtual Visits (Telemedicine).  Patients are able to view lab/test results, encounter notes, upcoming appointments, etc.  Non-urgent messages can be sent to your provider as well.   To learn more about what you can do with MyChart, go to [email or web address].    Your next appointment:   6 month(s)  Provider:   You may see Kate Sable, MD or one of the following Advanced Practice Providers on your designated Care Team:   Murray Hodgkins, NP Christell Faith, PA-C Cadence Kathlen Mody, PA-C Gerrie Nordmann, NP   Signed, Kate Sable, MD  01/09/2023 9:24 AM    [place]

## 2023-01-25 DIAGNOSIS — H02132 Senile ectropion of right lower eyelid: Secondary | ICD-10-CM | POA: Diagnosis not present

## 2023-01-25 DIAGNOSIS — Z961 Presence of intraocular lens: Secondary | ICD-10-CM | POA: Diagnosis not present

## 2023-01-25 DIAGNOSIS — H26491 Other secondary cataract, right eye: Secondary | ICD-10-CM | POA: Diagnosis not present

## 2023-02-11 ENCOUNTER — Ambulatory Visit (INDEPENDENT_AMBULATORY_CARE_PROVIDER_SITE_OTHER): Payer: PPO

## 2023-02-11 DIAGNOSIS — I639 Cerebral infarction, unspecified: Secondary | ICD-10-CM

## 2023-02-12 LAB — CUP PACEART REMOTE DEVICE CHECK
Date Time Interrogation Session: 20240318001600
Implantable Pulse Generator Implant Date: 20220420

## 2023-02-14 DIAGNOSIS — M2041 Other hammer toe(s) (acquired), right foot: Secondary | ICD-10-CM | POA: Diagnosis not present

## 2023-02-14 DIAGNOSIS — M778 Other enthesopathies, not elsewhere classified: Secondary | ICD-10-CM | POA: Diagnosis not present

## 2023-02-14 DIAGNOSIS — L600 Ingrowing nail: Secondary | ICD-10-CM | POA: Diagnosis not present

## 2023-02-14 DIAGNOSIS — M2042 Other hammer toe(s) (acquired), left foot: Secondary | ICD-10-CM | POA: Diagnosis not present

## 2023-02-19 NOTE — Progress Notes (Signed)
Carelink Summary Report / Loop Recorder 

## 2023-02-21 ENCOUNTER — Telehealth: Payer: Self-pay | Admitting: Cardiology

## 2023-02-21 DIAGNOSIS — R7989 Other specified abnormal findings of blood chemistry: Secondary | ICD-10-CM | POA: Diagnosis not present

## 2023-02-21 DIAGNOSIS — N183 Chronic kidney disease, stage 3 unspecified: Secondary | ICD-10-CM | POA: Diagnosis not present

## 2023-02-21 DIAGNOSIS — Z0000 Encounter for general adult medical examination without abnormal findings: Secondary | ICD-10-CM | POA: Diagnosis not present

## 2023-02-21 DIAGNOSIS — Z6834 Body mass index (BMI) 34.0-34.9, adult: Secondary | ICD-10-CM | POA: Diagnosis not present

## 2023-02-21 DIAGNOSIS — D696 Thrombocytopenia, unspecified: Secondary | ICD-10-CM | POA: Diagnosis not present

## 2023-02-21 DIAGNOSIS — I428 Other cardiomyopathies: Secondary | ICD-10-CM | POA: Diagnosis not present

## 2023-02-21 DIAGNOSIS — I5020 Unspecified systolic (congestive) heart failure: Secondary | ICD-10-CM | POA: Diagnosis not present

## 2023-02-21 DIAGNOSIS — Z8739 Personal history of other diseases of the musculoskeletal system and connective tissue: Secondary | ICD-10-CM | POA: Diagnosis not present

## 2023-02-21 MED ORDER — ENTRESTO 97-103 MG PO TABS: 1.0000 | ORAL_TABLET | Freq: Two times a day (BID) | ORAL | 0 refills | Status: DC

## 2023-02-21 NOTE — Telephone Encounter (Signed)
*  STAT* If patient is at the pharmacy, call can be transferred to refill team.   1. Which medications need to be refilled? (please list name of each medication and dose if known) Entresto 97 mg  2. Which pharmacy/location (including street and city if local pharmacy) is medication to be sent to? Mail order through [facility]  3. Do they need a 30 day or 90 day supply? [place]

## 2023-02-21 NOTE — Telephone Encounter (Signed)
Called Entresto patient assistance while patient was in the lobby. The last that I spoke with made me aware that a new application was sent but they are missing the 2nd person in his house holds income.   Made patient aware that we do not hold samples of entresto 97/103. Send a 90 day supply to patients pharmacy.   Made patient aware of what they are needing at patient assistance he stated he would bring what was needed tomorrow,

## 2023-02-21 NOTE — Telephone Encounter (Signed)
Patient calling the office for samples of medication:   1.  What medication and dosage are you requesting samples for? Entresto 97 mg   2.  Are you currently out of this medication? Yes  Pt is in lobby waiting. Please advise.

## 2023-02-22 NOTE — Telephone Encounter (Signed)
Incoming fax from [employer] patient assistance.   Gabriel Martin was approved.   Patient notified.

## 2023-02-22 NOTE — Telephone Encounter (Signed)
Patient brought wife's income into the office this AM.   Made a copy - gave original back to patient.  Faxed to [facility] patient assistance urgently - due to patient being out of this medication.

## 2023-03-01 DIAGNOSIS — D696 Thrombocytopenia, unspecified: Secondary | ICD-10-CM | POA: Diagnosis not present

## 2023-03-01 DIAGNOSIS — N183 Chronic kidney disease, stage 3 unspecified: Secondary | ICD-10-CM | POA: Diagnosis not present

## 2023-03-01 DIAGNOSIS — I5020 Unspecified systolic (congestive) heart failure: Secondary | ICD-10-CM | POA: Diagnosis not present

## 2023-03-01 DIAGNOSIS — M79671 Pain in right foot: Secondary | ICD-10-CM | POA: Diagnosis not present

## 2023-03-01 DIAGNOSIS — Z0000 Encounter for general adult medical examination without abnormal findings: Secondary | ICD-10-CM | POA: Diagnosis not present

## 2023-03-01 DIAGNOSIS — Z6834 Body mass index (BMI) 34.0-34.9, adult: Secondary | ICD-10-CM | POA: Diagnosis not present

## 2023-03-18 ENCOUNTER — Ambulatory Visit: Payer: PPO

## 2023-03-18 LAB — CUP PACEART REMOTE DEVICE CHECK
Date Time Interrogation Session: 20240420000614
Implantable Pulse Generator Implant Date: 20220420

## 2023-03-20 NOTE — Progress Notes (Signed)
Carelink Summary Report / Loop Recorder 

## 2023-03-21 ENCOUNTER — Ambulatory Visit (INDEPENDENT_AMBULATORY_CARE_PROVIDER_SITE_OTHER): Payer: PPO

## 2023-03-21 DIAGNOSIS — D2271 Melanocytic nevi of right lower limb, including hip: Secondary | ICD-10-CM | POA: Diagnosis not present

## 2023-03-21 DIAGNOSIS — L570 Actinic keratosis: Secondary | ICD-10-CM | POA: Diagnosis not present

## 2023-03-21 DIAGNOSIS — X32XXXA Exposure to sunlight, initial encounter: Secondary | ICD-10-CM | POA: Diagnosis not present

## 2023-03-21 DIAGNOSIS — D2272 Melanocytic nevi of left lower limb, including hip: Secondary | ICD-10-CM | POA: Diagnosis not present

## 2023-03-21 DIAGNOSIS — S90111A Contusion of right great toe without damage to nail, initial encounter: Secondary | ICD-10-CM | POA: Diagnosis not present

## 2023-03-21 DIAGNOSIS — I639 Cerebral infarction, unspecified: Secondary | ICD-10-CM | POA: Diagnosis not present

## 2023-03-21 DIAGNOSIS — D225 Melanocytic nevi of trunk: Secondary | ICD-10-CM | POA: Diagnosis not present

## 2023-03-21 DIAGNOSIS — D2261 Melanocytic nevi of right upper limb, including shoulder: Secondary | ICD-10-CM | POA: Diagnosis not present

## 2023-03-21 DIAGNOSIS — D2262 Melanocytic nevi of left upper limb, including shoulder: Secondary | ICD-10-CM | POA: Diagnosis not present

## 2023-03-21 DIAGNOSIS — L821 Other seborrheic keratosis: Secondary | ICD-10-CM | POA: Diagnosis not present

## 2023-04-16 NOTE — Progress Notes (Signed)
Carelink Summary Report / Loop Recorder 

## 2023-04-18 LAB — CUP PACEART REMOTE DEVICE CHECK
Date Time Interrogation Session: 20240523001317
Implantable Pulse Generator Implant Date: 20220420

## 2023-04-23 ENCOUNTER — Ambulatory Visit (INDEPENDENT_AMBULATORY_CARE_PROVIDER_SITE_OTHER): Payer: PPO

## 2023-04-23 DIAGNOSIS — I639 Cerebral infarction, unspecified: Secondary | ICD-10-CM | POA: Diagnosis not present

## 2023-05-03 ENCOUNTER — Other Ambulatory Visit (HOSPITAL_COMMUNITY): Payer: Self-pay | Admitting: Cardiology

## 2023-05-03 ENCOUNTER — Other Ambulatory Visit: Payer: Self-pay

## 2023-05-03 ENCOUNTER — Telehealth: Payer: Self-pay | Admitting: Cardiology

## 2023-05-03 MED ORDER — SPIRONOLACTONE 25 MG PO TABS
12.5000 mg | ORAL_TABLET | Freq: Every day | ORAL | 0 refills | Status: DC
  Filled 2023-05-03 (×3): qty 15, 30d supply, fill #0

## 2023-05-03 MED ORDER — METOPROLOL SUCCINATE ER 25 MG PO TB24: 25.0000 mg | ORAL_TABLET | Freq: Two times a day (BID) | ORAL | 0 refills | Status: DC

## 2023-05-03 MED ORDER — SPIRONOLACTONE 25 MG PO TABS: 12.5000 mg | ORAL_TABLET | Freq: Every day | ORAL | 0 refills | Status: DC

## 2023-05-03 NOTE — Telephone Encounter (Signed)
Disp Refills Start End   metoprolol succinate (TOPROL-XL) 25 MG 24 hr tablet 180 tablet 0 05/03/2023    Sig - Route: Take 1 tablet (25 mg total) by mouth 2 (two) times daily. - Oral   Sent to pharmacy as: metoprolol succinate (TOPROL-XL) 25 MG 24 hr tablet   E-Prescribing Status: Receipt confirmed by pharmacy (05/03/2023  9:57 AM EDT)    Pharmacy  [id number] - [place], [place] - [address]

## 2023-05-03 NOTE — Telephone Encounter (Signed)
*  STAT* If patient is at the pharmacy, call can be transferred to refill team.   1. Which medications need to be refilled? (please list name of each medication and dose if known) Metoprolol 25mg   2. Which pharmacy/location (including street and city if local pharmacy) is medication to be sent to?[facility]  3. Do they need a 30 day or 90 day supply? 90day

## 2023-05-16 NOTE — Progress Notes (Signed)
Carelink Summary Report / Loop Recorder 

## 2023-05-21 LAB — CUP PACEART REMOTE DEVICE CHECK
Date Time Interrogation Session: 20240625000614
Implantable Pulse Generator Implant Date: 20220420

## 2023-05-27 ENCOUNTER — Ambulatory Visit: Payer: PPO

## 2023-05-27 DIAGNOSIS — I639 Cerebral infarction, unspecified: Secondary | ICD-10-CM

## 2023-06-12 NOTE — Progress Notes (Signed)
 Carelink Summary Report / Loop Recorder 

## 2023-07-01 ENCOUNTER — Ambulatory Visit: Payer: PPO

## 2023-07-01 DIAGNOSIS — I639 Cerebral infarction, unspecified: Secondary | ICD-10-CM | POA: Diagnosis not present

## 2023-07-01 LAB — CUP PACEART REMOTE DEVICE CHECK
Date Time Interrogation Session: 20240805000620
Implantable Pulse Generator Implant Date: 20220420

## 2023-07-12 NOTE — Progress Notes (Signed)
Carelink Summary Report / Loop Recorder 

## 2023-07-16 ENCOUNTER — Ambulatory Visit: Payer: PPO | Attending: Cardiology | Admitting: Cardiology

## 2023-07-16 ENCOUNTER — Encounter: Payer: Self-pay | Admitting: Cardiology

## 2023-07-16 VITALS — BP 110/72 | HR 61 | Ht 66.0 in | Wt 213.4 lb

## 2023-07-16 DIAGNOSIS — I428 Other cardiomyopathies: Secondary | ICD-10-CM

## 2023-07-16 DIAGNOSIS — I639 Cerebral infarction, unspecified: Secondary | ICD-10-CM | POA: Diagnosis not present

## 2023-07-16 NOTE — Patient Instructions (Signed)
Medication Instructions:   Your physician recommends that you continue on your current medications as directed. Please refer to the Current Medication list given to you today.   *If you need a refill on your cardiac medications before your next appointment, please call your pharmacy*   Lab Work:  None Ordered  If you have labs (blood work) drawn today and your tests are completely normal, you will receive your results only by: MyChart Message (if you have MyChart) OR A paper copy in the mail If you have any lab test that is abnormal or we need to change your treatment, we will call you to review the results.   Testing/Procedures:  Your physician has requested that you have an echocardiogram in one year. Echocardiography is a painless test that uses sound waves to create images of your heart. It provides your doctor with information about the size and shape of your heart and how well your heart's chambers and valves are working. This procedure takes approximately one hour. There are no restrictions for this procedure. Please do NOT wear cologne, perfume, aftershave, or lotions (deodorant is allowed). Please arrive 15 minutes prior to your appointment time.    Follow-Up: At [facility], you and your health needs are our priority.  As part of our continuing mission to provide you with exceptional heart care, we have created designated Provider Care Teams.  These Care Teams include your primary Cardiologist (physician) and Advanced Practice Providers (APPs -  Physician Assistants and Nurse Practitioners) who all work together to provide you with the care you need, when you need it.  We recommend signing up for the patient portal called "MyChart".  Sign up information is provided on this After Visit Summary.  MyChart is used to connect with patients for Virtual Visits (Telemedicine).  Patients are able to view lab/test results, encounter notes, upcoming appointments, etc.   Non-urgent messages can be sent to your provider as well.   To learn more about what you can do with MyChart, go to [email or web address].    Your next appointment:   1 year(s)  Provider:   You may see Debbe Odea, MD or one of the following Advanced Practice Providers on your designated Care Team:   Nicolasa Ducking, NP Eula Listen, PA-C Cadence Fransico Michael, PA-C Charlsie Quest, NP

## 2023-07-16 NOTE — Progress Notes (Signed)
Cardiology Office Note:    Date:  07/16/2023   ID:  Gabriel Martin, DOB 13-Aug-1941, MRN [id number]  PCP:  Barbette Reichmann, MD  [facility] Cardiologist:  Debbe Odea, MD  [facility] Electrophysiologist:  Lanier Prude, MD   Referring MD: Barbette Reichmann, MD   Chief Complaint  Patient presents with   Follow-up    Patient denies new or acute cardiac problems/concerns today.      History of Present Illness:    Gabriel Martin is a 82 y.o. male with a hx of hypertension, NICM, EF 30 to 35%, CVA 10/2019 s/p ILR who presents for follow-up.    States doing okay, denies chest pain or shortness of breath.  Aldactone previously reduced to 12.5 mg daily due to low normal BPs.  Denies any dizziness or falls.  Feels well, no concerns at this time.  Tolerating all meds as prescribed.  Prior notes CMR 2/24 EF 31% Echo 09/2022 EF 30 to 35% Echocardiogram 08/2021 EF 30 to 35% Patient presented to the ED on 12/8 due to confusion, lightheadedness and loss consciousness, In the ED, work-up with an MRI showed 2 acute left MCA territory cortical infarcts.   Echo 10/2020 moderate to severely reduced ejection fraction, EF 30 to 35%, global hypokinesis.   Left heart cath 01/10/2021 mild nonobstructive CAD. CMR 03/2021 EF 27%.  Mid wall LGE.  Past Medical History:  Diagnosis Date   Acid reflux 05/12/2015   Anemia    Arthritis    finger thumb   Benign neoplasm of large bowel    Benign prostatic hyperplasia with urinary obstruction 05/12/2015   Change in blood platelet count 05/12/2015   CHF (congestive heart failure) (HCC)    Chronic kidney disease 05/12/2015   GERD (gastroesophageal reflux disease)    HLD (hyperlipidemia) 05/12/2015   Hyperlipidemia    Nocturia associated with benign prostatic hypertrophy    Penile ulcer 05/12/2015   Skin cancer    Stroke (HCC)    Thrombocytopenia [facility])     Past Surgical History:  Procedure Laterality Date   CATARACT EXTRACTION  W/PHACO Left 02/29/2016   Procedure: CATARACT EXTRACTION PHACO AND INTRAOCULAR LENS PLACEMENT (IOC) left;  Surgeon: Lockie Mola, MD;  Location: [facility];  Service: Ophthalmology;  Laterality: Left;   CATARACT EXTRACTION W/PHACO Right 04/25/2016   Procedure: CATARACT EXTRACTION PHACO AND INTRAOCULAR LENS PLACEMENT (IOC);  Surgeon: Lockie Mola, MD;  Location: [facility];  Service: Ophthalmology;  Laterality: Right;   COLONOSCOPY WITH PROPOFOL N/A 07/01/2017   Procedure: COLONOSCOPY WITH PROPOFOL;  Surgeon: Scot Jun, MD;  Location: [facility] ENDOSCOPY;  Service: Endoscopy;  Laterality: N/A;   EYE SURGERY     HEMORRHOID SURGERY     RIGHT/LEFT HEART CATH AND CORONARY ANGIOGRAPHY Bilateral 01/10/2021   Procedure: RIGHT/LEFT HEART CATH AND CORONARY ANGIOGRAPHY;  Surgeon: Yvonne Kendall, MD;  Location: [facility] INVASIVE CV LAB;  Service: Cardiovascular;  Laterality: Bilateral;   TEE WITHOUT CARDIOVERSION N/A 11/30/2020   Procedure: TRANSESOPHAGEAL ECHOCARDIOGRAM (TEE);  Surgeon: Debbe Odea, MD;  Location: [facility] ORS;  Service: Cardiovascular;  Laterality: N/A;    Current Medications: Current Meds  Medication Sig   acetaminophen (TYLENOL) 500 MG tablet Take 500 mg by mouth every 6 (six) hours as needed.   allopurinol (ZYLOPRIM) 100 MG tablet Take 100 mg by mouth daily.   aspirin EC 81 MG EC tablet Take 1 tablet (81 mg total) by mouth daily. Swallow whole.   atorvastatin (LIPITOR) 40 MG tablet Take 1 tablet (  40 mg total) by mouth daily at 6 PM.   empagliflozin (JARDIANCE) 10 MG TABS tablet Take 1 tablet by mouth daily before breakfast.   famotidine (PEPCID) 20 MG tablet Take 20 mg by mouth daily.   gabapentin (NEURONTIN) 100 MG capsule Take 100 mg by mouth daily.   metoprolol succinate (TOPROL-XL) 25 MG 24 hr tablet Take 1 tablet (25 mg total) by mouth 2 (two) times daily.   sacubitril-valsartan (ENTRESTO) 97-103 MG Take 1 tablet by mouth 2 (two) times daily.    spironolactone (ALDACTONE) 25 MG tablet Take 0.5 tablets (12.5 mg total) by mouth at bedtime.   spironolactone (ALDACTONE) 25 MG tablet Take 0.5 tablets (12.5 mg total) by mouth at bedtime.     Allergies:   Patient has no known allergies.   Social History   Socioeconomic History   Marital status: Married    Spouse name: Not on file   Number of children: Not on file   Years of education: Not on file   Highest education level: Not on file  Occupational History   Not on file  Tobacco Use   Smoking status: Former    Current packs/day: 0.00    Types: Cigarettes    Quit date: 11/26/1978    Years since quitting: 44.6   Smokeless tobacco: Never   Tobacco comments:    quit 40 years ago  Vaping Use   Vaping status: Never Used  Substance and Sexual Activity   Alcohol use: Yes    Alcohol/week: 6.0 standard drinks of alcohol    Types: 6 Cans of beer per week    Comment: occasional   Drug use: No   Sexual activity: Never  Other Topics Concern   Not on file  Social History Narrative   Not on file   Social Determinants of Health   Financial Resource Strain: Low Risk  (03/01/2023)   Received from [facility], [facility]   Overall Financial Resource Strain (CARDIA)    Difficulty of Paying Living Expenses: Not hard at all  Food Insecurity: No Food Insecurity (03/01/2023)   Received from [facility], [facility]   Hunger Vital Sign    Worried About Running Out of Food in the Last Year: Never true    Ran Out of Food in the Last Year: Never true  Transportation Needs: No Transportation Needs (03/01/2023)   Received from [facility], [facility]   [facility] - Transportation    In the past 12 months, has lack of transportation kept you from medical appointments or from getting medications?: No    Lack of Transportation (Non-Medical): No  Physical Activity: Not on file  Stress: Not on  file  Social Connections: Not on file     Family History: The patient's family history includes Heart attack in his mother; Heart attack (age of onset: 59) in his nephew; Heart attack (age of onset: 80) in his brother; Heart disease in his brother.  ROS:   Please see the history of present illness.     All other systems reviewed and are negative.  EKGs/Labs/Other Studies Reviewed:    The following studies were reviewed today:  EKG Interpretation Date/Time:  Tuesday July 16 2023 08:36:30 EDT Ventricular Rate:  61 PR Interval:  222 QRS Duration:  108 QT Interval:  446 QTC Calculation: 448 R Axis:   -27  Text Interpretation: Sinus rhythm with 1st degree A-V block Indeterminate axis Inferior infarct ,  age undetermined Confirmed by Debbe Odea ([id number]) on 07/16/2023 8:51:19 AM    Recent Labs: 10/30/2022: Hemoglobin 15.1  Recent Lipid Panel    Component Value Date/Time   CHOL 137 11/04/2020 0526   TRIG 68 11/04/2020 0526   HDL 48 11/04/2020 0526   CHOLHDL 2.9 11/04/2020 0526   VLDL 14 11/04/2020 0526   LDLCALC 75 11/04/2020 0526     Risk Assessment/Calculations:      Physical Exam:    VS:  BP 110/72 (BP Location: Left Arm, Patient Position: Sitting, Cuff Size: Normal)   Pulse 61   Ht 5\' 6"  (1.676 m)   Wt 213 lb 6.4 oz (96.8 kg)   SpO2 97%   BMI 34.44 kg/m     Wt Readings from Last 3 Encounters:  07/16/23 213 lb 6.4 oz (96.8 kg)  01/09/23 209 lb (94.8 kg)  12/26/22 213 lb (96.6 kg)     GEN:  Well nourished, well developed in no acute distress HEENT: Normal NECK: No JVD; No carotid bruits CARDIAC: RRR, no murmurs, rubs, gallops RESPIRATORY:  Clear to auscultation without rales, wheezing or rhonchi  ABDOMEN: Soft, non-tender, non-distended MUSCULOSKELETAL:  No edema; No deformity  SKIN: Warm and dry NEUROLOGIC:  Alert and oriented x 3 PSYCHIATRIC:  Normal affect   ASSESSMENT:    1. NICM (nonischemic cardiomyopathy) (HCC)   2. Cerebrovascular  accident (CVA), unspecified mechanism (HCC)    PLAN:    In order of problems listed above:  Nonischemic cardiomyopathy, EF 30 to 35% on echo 11/23.  NYHA class II symptoms.  Patient appears euvolemic.  Heart rate 61.  BP better with reducing Aldactone.  Continue Aldactone 12.5 mg daily, Entresto 97-103 mg twice daily, Toprol-XL 25 mg daily, jardiance 10 mg qd.  Repeat echo in 1 year.  Previously evaluated by EP, not much data suggesting benefits of ICD in octogenarians. Hx of CVA 10/2020, ILR with no evidence for A-fib or flutter so far.  Cont aspirin, Lipitor.  Follow-up in 6-12 months   Medication Adjustments/Labs and Tests Ordered: Current medicines are reviewed at length with the patient today.  Concerns regarding medicines are outlined above.  Orders Placed This Encounter  Procedures   EKG 12-Lead   ECHOCARDIOGRAM COMPLETE     No orders of the defined types were placed in this encounter.    Patient Instructions  Medication Instructions:   Your physician recommends that you continue on your current medications as directed. Please refer to the Current Medication list given to you today.   *If you need a refill on your cardiac medications before your next appointment, please call your pharmacy*   Lab Work:  None Ordered  If you have labs (blood work) drawn today and your tests are completely normal, you will receive your results only by: MyChart Message (if you have MyChart) OR A paper copy in the mail If you have any lab test that is abnormal or we need to change your treatment, we will call you to review the results.   Testing/Procedures:  Your physician has requested that you have an echocardiogram in one year. Echocardiography is a painless test that uses sound waves to create images of your heart. It provides your doctor with information about the size and shape of your heart and how well your heart's chambers and valves are working. This procedure takes  approximately one hour. There are no restrictions for this procedure. Please do NOT wear cologne, perfume, aftershave, or lotions (deodorant is allowed). Please arrive 25  minutes prior to your appointment time.    Follow-Up: At [facility], you and your health needs are our priority.  As part of our continuing mission to provide you with exceptional heart care, we have created designated Provider Care Teams.  These Care Teams include your primary Cardiologist (physician) and Advanced Practice Providers (APPs -  Physician Assistants and Nurse Practitioners) who all work together to provide you with the care you need, when you need it.  We recommend signing up for the patient portal called "MyChart".  Sign up information is provided on this After Visit Summary.  MyChart is used to connect with patients for Virtual Visits (Telemedicine).  Patients are able to view lab/test results, encounter notes, upcoming appointments, etc.  Non-urgent messages can be sent to your provider as well.   To learn more about what you can do with MyChart, go to [email or web address].    Your next appointment:   1 year(s)  Provider:   You may see Debbe Odea, MD or one of the following Advanced Practice Providers on your designated Care Team:   Nicolasa Ducking, NP Eula Listen, PA-C Cadence Fransico Michael, PA-C Charlsie Quest, NP   Signed, Debbe Odea, MD  07/16/2023 10:10 AM    [facility]

## 2023-08-05 ENCOUNTER — Ambulatory Visit (INDEPENDENT_AMBULATORY_CARE_PROVIDER_SITE_OTHER): Payer: PPO

## 2023-08-05 DIAGNOSIS — I639 Cerebral infarction, unspecified: Secondary | ICD-10-CM

## 2023-08-05 LAB — CUP PACEART REMOTE DEVICE CHECK
Date Time Interrogation Session: 20240907000508
Implantable Pulse Generator Implant Date: 20220420

## 2023-08-15 NOTE — Progress Notes (Signed)
Carelink Summary Report / Loop Recorder 

## 2023-08-20 ENCOUNTER — Other Ambulatory Visit: Payer: Self-pay

## 2023-08-20 MED ORDER — SPIRONOLACTONE 25 MG PO TABS: 12.5000 mg | ORAL_TABLET | Freq: Every day | ORAL | 3 refills | Status: DC

## 2023-08-22 DIAGNOSIS — M5136 Other intervertebral disc degeneration, lumbar region: Secondary | ICD-10-CM | POA: Diagnosis not present

## 2023-08-22 DIAGNOSIS — M47816 Spondylosis without myelopathy or radiculopathy, lumbar region: Secondary | ICD-10-CM | POA: Diagnosis not present

## 2023-08-22 DIAGNOSIS — G8929 Other chronic pain: Secondary | ICD-10-CM | POA: Diagnosis not present

## 2023-08-22 DIAGNOSIS — M5441 Lumbago with sciatica, right side: Secondary | ICD-10-CM | POA: Diagnosis not present

## 2023-09-06 DIAGNOSIS — R7309 Other abnormal glucose: Secondary | ICD-10-CM | POA: Diagnosis not present

## 2023-09-06 DIAGNOSIS — D696 Thrombocytopenia, unspecified: Secondary | ICD-10-CM | POA: Diagnosis not present

## 2023-09-06 DIAGNOSIS — M79671 Pain in right foot: Secondary | ICD-10-CM | POA: Diagnosis not present

## 2023-09-06 DIAGNOSIS — Z0000 Encounter for general adult medical examination without abnormal findings: Secondary | ICD-10-CM | POA: Diagnosis not present

## 2023-09-06 DIAGNOSIS — Z6834 Body mass index (BMI) 34.0-34.9, adult: Secondary | ICD-10-CM | POA: Diagnosis not present

## 2023-09-06 DIAGNOSIS — N183 Chronic kidney disease, stage 3 unspecified: Secondary | ICD-10-CM | POA: Diagnosis not present

## 2023-09-06 DIAGNOSIS — D649 Anemia, unspecified: Secondary | ICD-10-CM | POA: Diagnosis not present

## 2023-09-06 DIAGNOSIS — I5020 Unspecified systolic (congestive) heart failure: Secondary | ICD-10-CM | POA: Diagnosis not present

## 2023-09-09 ENCOUNTER — Ambulatory Visit (INDEPENDENT_AMBULATORY_CARE_PROVIDER_SITE_OTHER): Payer: PPO

## 2023-09-09 DIAGNOSIS — I639 Cerebral infarction, unspecified: Secondary | ICD-10-CM | POA: Diagnosis not present

## 2023-09-09 LAB — CUP PACEART REMOTE DEVICE CHECK
Date Time Interrogation Session: 20241014000937
Implantable Pulse Generator Implant Date: 20220420

## 2023-09-13 DIAGNOSIS — D696 Thrombocytopenia, unspecified: Secondary | ICD-10-CM | POA: Diagnosis not present

## 2023-09-13 DIAGNOSIS — N182 Chronic kidney disease, stage 2 (mild): Secondary | ICD-10-CM | POA: Diagnosis not present

## 2023-09-13 DIAGNOSIS — R7989 Other specified abnormal findings of blood chemistry: Secondary | ICD-10-CM | POA: Diagnosis not present

## 2023-09-13 DIAGNOSIS — I428 Other cardiomyopathies: Secondary | ICD-10-CM | POA: Diagnosis not present

## 2023-09-13 DIAGNOSIS — M5441 Lumbago with sciatica, right side: Secondary | ICD-10-CM | POA: Diagnosis not present

## 2023-09-13 DIAGNOSIS — Z0000 Encounter for general adult medical examination without abnormal findings: Secondary | ICD-10-CM | POA: Diagnosis not present

## 2023-09-13 DIAGNOSIS — Z8739 Personal history of other diseases of the musculoskeletal system and connective tissue: Secondary | ICD-10-CM | POA: Diagnosis not present

## 2023-09-13 DIAGNOSIS — G8929 Other chronic pain: Secondary | ICD-10-CM | POA: Diagnosis not present

## 2023-09-13 DIAGNOSIS — D649 Anemia, unspecified: Secondary | ICD-10-CM | POA: Diagnosis not present

## 2023-09-13 DIAGNOSIS — Z6834 Body mass index (BMI) 34.0-34.9, adult: Secondary | ICD-10-CM | POA: Diagnosis not present

## 2023-09-13 DIAGNOSIS — Z23 Encounter for immunization: Secondary | ICD-10-CM | POA: Diagnosis not present

## 2023-09-20 ENCOUNTER — Inpatient Hospital Stay: Payer: PPO | Attending: Oncology | Admitting: Oncology

## 2023-09-20 ENCOUNTER — Encounter: Payer: Self-pay | Admitting: Oncology

## 2023-09-20 ENCOUNTER — Inpatient Hospital Stay: Payer: PPO

## 2023-09-20 VITALS — BP 108/73 | HR 62 | Temp 97.7°F | Resp 16 | Ht 66.0 in | Wt 214.0 lb

## 2023-09-20 DIAGNOSIS — Z87891 Personal history of nicotine dependence: Secondary | ICD-10-CM | POA: Insufficient documentation

## 2023-09-20 DIAGNOSIS — D696 Thrombocytopenia, unspecified: Secondary | ICD-10-CM | POA: Diagnosis not present

## 2023-09-20 DIAGNOSIS — Z79899 Other long term (current) drug therapy: Secondary | ICD-10-CM | POA: Insufficient documentation

## 2023-09-20 LAB — FERRITIN: Ferritin: 169 ng/mL (ref 24–336)

## 2023-09-20 LAB — CBC (CANCER CENTER ONLY)
HCT: 41.6 % (ref 39.0–52.0)
Hemoglobin: 13.6 g/dL (ref 13.0–17.0)
MCH: 33.1 pg (ref 26.0–34.0)
MCHC: 32.7 g/dL (ref 30.0–36.0)
MCV: 101.2 fL — ABNORMAL HIGH (ref 80.0–100.0)
Platelet Count: 106 10*3/uL — ABNORMAL LOW (ref 150–400)
RBC: 4.11 MIL/uL — ABNORMAL LOW (ref 4.22–5.81)
RDW: 12.8 % (ref 11.5–15.5)
WBC Count: 5.5 10*3/uL (ref 4.0–10.5)
nRBC: 0 % (ref 0.0–0.2)

## 2023-09-20 LAB — VITAMIN B12: Vitamin B-12: 589 pg/mL (ref 180–914)

## 2023-09-20 LAB — IRON AND TIBC
Iron: 91 ug/dL (ref 45–182)
Saturation Ratios: 38 % (ref 17.9–39.5)
TIBC: 239 ug/dL — ABNORMAL LOW (ref 250–450)
UIBC: 148 ug/dL

## 2023-09-20 LAB — FOLATE: Folate: 15.4 ng/mL (ref >5.9)

## 2023-09-20 LAB — LACTATE DEHYDROGENASE: LDH: 143 U/L (ref 98–192)

## 2023-09-20 NOTE — Progress Notes (Signed)
[facility]  [phone] [phone]  ID: Molli Barrows OB: 05/03/1941  MR#: [id number]  AOZ#:[id number]  Patient Care Team: Barbette Reichmann, MD as PCP - General (Internal Medicine) Debbe Odea, MD as PCP - Cardiology (Cardiology) Lanier Prude, MD as PCP - Electrophysiology (Cardiology) Jeralyn Ruths, MD as Consulting Physician (Oncology)  CHIEF COMPLAINT: Thrombocytopenia.  INTERVAL HISTORY: Patient is an 82 year old male who was noted to have a mildly decreased hemoglobin as well as decreased platelet count on routine blood work.  He currently feels well and is asymptomatic.  He has no neurologic complaints.  He denies any recent fevers or illnesses.  He has a good appetite and denies weight loss.  He has no chest pain, shortness of breath, cough, or hemoptysis.  He denies any nausea, vomiting, constipation, or diarrhea.  He has no urinary complaints.  Patient feels at his baseline and offers no specific complaints today.  REVIEW OF SYSTEMS:   Review of Systems  Constitutional: Negative.  Negative for fever, malaise/fatigue and weight loss.  Respiratory: Negative.  Negative for cough, hemoptysis and shortness of breath.   Cardiovascular: Negative.  Negative for chest pain and leg swelling.  Gastrointestinal: Negative.  Negative for abdominal pain, blood in stool and melena.  Genitourinary: Negative.  Negative for dysuria.  Musculoskeletal: Negative.  Negative for back pain.  Skin: Negative.  Negative for rash.  Neurological: Negative.  Negative for dizziness, focal weakness, weakness and headaches.  Psychiatric/Behavioral: Negative.  The patient is not nervous/anxious.     As per HPI. Otherwise, a complete review of systems is negative.  PAST MEDICAL HISTORY: Past Medical History:  Diagnosis Date   Acid reflux 05/12/2015   Anemia    Arthritis    finger thumb   Benign neoplasm of large bowel    Benign prostatic  hyperplasia with urinary obstruction 05/12/2015   Change in blood platelet count 05/12/2015   CHF (congestive heart failure) (HCC)    Chronic kidney disease 05/12/2015   GERD (gastroesophageal reflux disease)    HLD (hyperlipidemia) 05/12/2015   Hyperlipidemia    Nocturia associated with benign prostatic hypertrophy    Penile ulcer 05/12/2015   Skin cancer    Stroke (HCC)    Thrombocytopenia (HCC)     PAST SURGICAL HISTORY: Past Surgical History:  Procedure Laterality Date   CATARACT EXTRACTION W/PHACO Left 02/29/2016   Procedure: CATARACT EXTRACTION PHACO AND INTRAOCULAR LENS PLACEMENT (IOC) left;  Surgeon: Lockie Mola, MD;  Location: [facility];  Service: Ophthalmology;  Laterality: Left;   CATARACT EXTRACTION W/PHACO Right 04/25/2016   Procedure: CATARACT EXTRACTION PHACO AND INTRAOCULAR LENS PLACEMENT (IOC);  Surgeon: Lockie Mola, MD;  Location: [facility];  Service: Ophthalmology;  Laterality: Right;   COLONOSCOPY WITH PROPOFOL N/A 07/01/2017   Procedure: COLONOSCOPY WITH PROPOFOL;  Surgeon: Scot Jun, MD;  Location: [facility] ENDOSCOPY;  Service: Endoscopy;  Laterality: N/A;   EYE SURGERY     HEMORRHOID SURGERY     RIGHT/LEFT HEART CATH AND CORONARY ANGIOGRAPHY Bilateral 01/10/2021   Procedure: RIGHT/LEFT HEART CATH AND CORONARY ANGIOGRAPHY;  Surgeon: Yvonne Kendall, MD;  Location: [facility] INVASIVE CV LAB;  Service: Cardiovascular;  Laterality: Bilateral;   TEE WITHOUT CARDIOVERSION N/A 11/30/2020   Procedure: TRANSESOPHAGEAL ECHOCARDIOGRAM (TEE);  Surgeon: Debbe Odea, MD;  Location: [facility] ORS;  Service: Cardiovascular;  Laterality: N/A;    FAMILY HISTORY: Family History  Problem Relation Age of Onset   Heart disease Brother    Heart attack Brother 72  Heart attack Mother    Heart attack Nephew 19    ADVANCED DIRECTIVES (Y/N):  N  HEALTH MAINTENANCE: Social History   Tobacco Use   Smoking status: Former    Current packs/day: 0.00     Types: Cigarettes    Quit date: 11/26/1978    Years since quitting: 44.8   Smokeless tobacco: Never   Tobacco comments:    quit 40 years ago  Vaping Use   Vaping status: Never Used  Substance Use Topics   Alcohol use: Yes    Alcohol/week: 6.0 standard drinks of alcohol    Types: 6 Cans of beer per week    Comment: occasional   Drug use: No     Colonoscopy:  PAP:  Bone density:  Lipid panel:  No Known Allergies  Current Outpatient Medications  Medication Sig Dispense Refill   acetaminophen (TYLENOL) 500 MG tablet Take 500 mg by mouth every 6 (six) hours as needed.     allopurinol (ZYLOPRIM) 100 MG tablet Take 100 mg by mouth daily.     aspirin EC 81 MG EC tablet Take 1 tablet (81 mg total) by mouth daily. Swallow whole. 30 tablet 0   atorvastatin (LIPITOR) 40 MG tablet Take 1 tablet (40 mg total) by mouth daily at 6 PM. 30 tablet 0   cyanocobalamin (VITAMIN B12) 1000 MCG tablet Take 1,000 mcg by mouth daily.     empagliflozin (JARDIANCE) 10 MG TABS tablet Take 1 tablet by mouth daily before breakfast.     famotidine (PEPCID) 20 MG tablet Take 20 mg by mouth daily.     gabapentin (NEURONTIN) 100 MG capsule Take 100 mg by mouth daily.     metoprolol succinate (TOPROL-XL) 25 MG 24 hr tablet Take 1 tablet (25 mg total) by mouth 2 (two) times daily. 180 tablet 0   sacubitril-valsartan (ENTRESTO) 97-103 MG Take 1 tablet by mouth 2 (two) times daily. 180 tablet 0   spironolactone (ALDACTONE) 25 MG tablet Take 0.5 tablets (12.5 mg total) by mouth at bedtime. 15 tablet 0   spironolactone (ALDACTONE) 25 MG tablet Take 0.5 tablets (12.5 mg total) by mouth at bedtime. 45 tablet 3   No current facility-administered medications for this visit.    OBJECTIVE: Vitals:   09/20/23 1057  BP: 108/73  Pulse: 62  Resp: 16  Temp: 97.7 F (36.5 C)  SpO2: 96%     Body mass index is 34.54 kg/m.    ECOG FS:0 - Asymptomatic  General: Well-developed, well-nourished, no acute distress. Eyes: Pink  conjunctiva, anicteric sclera. HEENT: Normocephalic, moist mucous membranes. Lungs: No audible wheezing or coughing. Heart: Regular rate and rhythm. Abdomen: Soft, nontender, no obvious distention. Musculoskeletal: No edema, cyanosis, or clubbing. Neuro: Alert, answering all questions appropriately. Cranial nerves grossly intact. Skin: No rashes or petechiae noted. Psych: Normal affect. Lymphatics: No cervical, calvicular, axillary or inguinal LAD.   LAB RESULTS:  Lab Results  Component Value Date   NA 138 02/05/2022   K 5.2 (H) 02/05/2022   CL 108 02/05/2022   CO2 24 02/05/2022   GLUCOSE 111 (H) 02/05/2022   BUN 31 (H) 02/05/2022   CREATININE 1.59 (H) 02/05/2022   CALCIUM 9.1 02/05/2022   PROT 6.6 11/03/2020   ALBUMIN 4.1 11/03/2020   AST 19 11/03/2020   ALT 24 11/03/2020   ALKPHOS 39 11/03/2020   BILITOT 1.3 (H) 11/03/2020   GFRNONAA 43 (L) 02/05/2022   GFRAA 60 01/05/2021    Lab Results  Component Value Date  WBC 5.5 09/20/2023   NEUTROABS 4.3 03/10/2013   HGB 13.6 09/20/2023   HCT 41.6 09/20/2023   MCV 101.2 (H) 09/20/2023   PLT 106 (L) 09/20/2023     STUDIES: CUP PACEART REMOTE DEVICE CHECK  Result Date: 09/09/2023 ILR summary report received. Battery status OK. Normal device function. No new symptom, tachy, brady, or pause episodes. No new AF episodes. AF burden is 0% of the time.  Monthly summary reports and ROV/PRN ML, CVRS   ASSESSMENT: Thrombocytopenia.  PLAN:    Thrombocytopenia: Patient's platelet count is decreased, but stable at 106.  All of his other laboratory work from today is pending at time of dictation.  No other interventions are needed.  Patient does not require bone marrow biopsy.  Return to clinic in 3 weeks for further evaluation and discussion of his laboratory results. Anemia: Resolved.  Patient's hemoglobin is 13.6 today.  He has a mild macrocytosis and B12 and folate levels are pending.  I spent a total of 45 minutes reviewing  chart data, face-to-face evaluation with the patient, counseling and coordination of care as detailed above.   Patient expressed understanding and was in agreement with this plan. He also understands that He can call clinic at any time with any questions, concerns, or complaints.    Jeralyn Ruths, MD   09/20/2023 12:50 PM

## 2023-09-23 LAB — PLATELET ANTIBODY PROFILE
Glycoprotein IV Antibody: NEGATIVE
HLA Ab Ser Ql EIA: NEGATIVE
IA/IIA Antibody: NEGATIVE
IB/IX Antibody: NEGATIVE
IIB/IIIA Antibody: NEGATIVE

## 2023-09-23 NOTE — Progress Notes (Signed)
Carelink Summary Report / Loop Recorder 

## 2023-09-24 LAB — PROTEIN ELECTROPHORESIS, SERUM
A/G Ratio: 1.4 (ref 0.7–1.7)
Albumin ELP: 3.6 g/dL (ref 2.9–4.4)
Alpha-1-Globulin: 0.2 g/dL (ref 0.0–0.4)
Alpha-2-Globulin: 0.7 g/dL (ref 0.4–1.0)
Beta Globulin: 0.8 g/dL (ref 0.7–1.3)
Gamma Globulin: 0.8 g/dL (ref 0.4–1.8)
Globulin, Total: 2.6 g/dL (ref 2.2–3.9)
M-Spike, %: 0.2 g/dL — ABNORMAL HIGH
Total Protein ELP: 6.2 g/dL (ref 6.0–8.5)

## 2023-09-24 LAB — HAPTOGLOBIN: Haptoglobin: 129 mg/dL (ref 38–329)

## 2023-10-04 LAB — INTELLIGEN MYELOID

## 2023-10-11 ENCOUNTER — Encounter: Payer: Self-pay | Admitting: Oncology

## 2023-10-11 ENCOUNTER — Inpatient Hospital Stay: Payer: PPO | Attending: Oncology | Admitting: Oncology

## 2023-10-11 VITALS — BP 110/66 | HR 59 | Temp 97.3°F | Resp 16 | Ht 66.0 in | Wt 214.0 lb

## 2023-10-11 DIAGNOSIS — D472 Monoclonal gammopathy: Secondary | ICD-10-CM | POA: Insufficient documentation

## 2023-10-11 DIAGNOSIS — Z87891 Personal history of nicotine dependence: Secondary | ICD-10-CM | POA: Insufficient documentation

## 2023-10-11 DIAGNOSIS — D696 Thrombocytopenia, unspecified: Secondary | ICD-10-CM | POA: Diagnosis not present

## 2023-10-11 DIAGNOSIS — Z79899 Other long term (current) drug therapy: Secondary | ICD-10-CM | POA: Insufficient documentation

## 2023-10-11 NOTE — Progress Notes (Signed)
[facility]  [phone]) [phone] [phone]  ID: Gabriel Martin OB: 1941-05-17  MR#: [id number]  QIO#:[id number]  Patient Care Team: Barbette Reichmann, MD as PCP - General (Internal Medicine) Debbe Odea, MD as PCP - Cardiology (Cardiology) Lanier Prude, MD as PCP - Electrophysiology (Cardiology) Jeralyn Ruths, MD as Consulting Physician (Oncology)  CHIEF COMPLAINT: Thrombocytopenia.  INTERVAL HISTORY: Patient returns to clinic today for further evaluation and discussion of his laboratory results.  He currently feels well and is asymptomatic. He has no neurologic complaints.  He denies any recent fevers or illnesses.  He has a good appetite and denies weight loss.  He has no chest pain, shortness of breath, cough, or hemoptysis.  He denies any nausea, vomiting, constipation, or diarrhea.  He has no urinary complaints.  Patient offers no specific complaints today.  REVIEW OF SYSTEMS:   Review of Systems  Constitutional: Negative.  Negative for fever, malaise/fatigue and weight loss.  Respiratory: Negative.  Negative for cough, hemoptysis and shortness of breath.   Cardiovascular: Negative.  Negative for chest pain and leg swelling.  Gastrointestinal: Negative.  Negative for abdominal pain, blood in stool and melena.  Genitourinary: Negative.  Negative for dysuria.  Musculoskeletal: Negative.  Negative for back pain.  Skin: Negative.  Negative for rash.  Neurological: Negative.  Negative for dizziness, focal weakness, weakness and headaches.  Psychiatric/Behavioral: Negative.  The patient is not nervous/anxious.     As per HPI. Otherwise, a complete review of systems is negative.  PAST MEDICAL HISTORY: Past Medical History:  Diagnosis Date   Acid reflux 05/12/2015   Anemia    Arthritis    finger thumb   Benign neoplasm of large bowel    Benign prostatic hyperplasia with urinary obstruction 05/12/2015   Change in blood platelet  count 05/12/2015   CHF (congestive heart failure) (HCC)    Chronic kidney disease 05/12/2015   GERD (gastroesophageal reflux disease)    HLD (hyperlipidemia) 05/12/2015   Hyperlipidemia    Nocturia associated with benign prostatic hypertrophy    Penile ulcer 05/12/2015   Skin cancer    Stroke (HCC)    Thrombocytopenia (HCC)     PAST SURGICAL HISTORY: Past Surgical History:  Procedure Laterality Date   CATARACT EXTRACTION W/PHACO Left 02/29/2016   Procedure: CATARACT EXTRACTION PHACO AND INTRAOCULAR LENS PLACEMENT (IOC) left;  Surgeon: Lockie Mola, MD;  Location: [facility];  Service: Ophthalmology;  Laterality: Left;   CATARACT EXTRACTION W/PHACO Right 04/25/2016   Procedure: CATARACT EXTRACTION PHACO AND INTRAOCULAR LENS PLACEMENT (IOC);  Surgeon: Lockie Mola, MD;  Location: [facility];  Service: Ophthalmology;  Laterality: Right;   COLONOSCOPY WITH PROPOFOL N/A 07/01/2017   Procedure: COLONOSCOPY WITH PROPOFOL;  Surgeon: Scot Jun, MD;  Location: [facility] ENDOSCOPY;  Service: Endoscopy;  Laterality: N/A;   EYE SURGERY     HEMORRHOID SURGERY     RIGHT/LEFT HEART CATH AND CORONARY ANGIOGRAPHY Bilateral 01/10/2021   Procedure: RIGHT/LEFT HEART CATH AND CORONARY ANGIOGRAPHY;  Surgeon: Yvonne Kendall, MD;  Location: [facility] INVASIVE CV LAB;  Service: Cardiovascular;  Laterality: Bilateral;   TEE WITHOUT CARDIOVERSION N/A 11/30/2020   Procedure: TRANSESOPHAGEAL ECHOCARDIOGRAM (TEE);  Surgeon: Debbe Odea, MD;  Location: [facility] ORS;  Service: Cardiovascular;  Laterality: N/A;    FAMILY HISTORY: Family History  Problem Relation Age of Onset   Heart disease Brother    Heart attack Brother 4   Heart attack Mother    Heart attack Nephew 55    ADVANCED  DIRECTIVES (Y/N):  N  HEALTH MAINTENANCE: Social History   Tobacco Use   Smoking status: Former    Current packs/day: 0.00    Types: Cigarettes    Quit date: 11/26/1978    Years since quitting: 44.9    Smokeless tobacco: Never   Tobacco comments:    quit 40 years ago  Vaping Use   Vaping status: Never Used  Substance Use Topics   Alcohol use: Yes    Alcohol/week: 6.0 standard drinks of alcohol    Types: 6 Cans of beer per week    Comment: occasional   Drug use: No     Colonoscopy:  PAP:  Bone density:  Lipid panel:  No Known Allergies  Current Outpatient Medications  Medication Sig Dispense Refill   acetaminophen (TYLENOL) 500 MG tablet Take 500 mg by mouth every 6 (six) hours as needed.     allopurinol (ZYLOPRIM) 100 MG tablet Take 100 mg by mouth daily.     aspirin EC 81 MG EC tablet Take 1 tablet (81 mg total) by mouth daily. Swallow whole. 30 tablet 0   atorvastatin (LIPITOR) 40 MG tablet Take 1 tablet (40 mg total) by mouth daily at 6 PM. 30 tablet 0   cyanocobalamin (VITAMIN B12) 1000 MCG tablet Take 1,000 mcg by mouth daily.     empagliflozin (JARDIANCE) 10 MG TABS tablet Take 1 tablet by mouth daily before breakfast.     famotidine (PEPCID) 20 MG tablet Take 20 mg by mouth daily.     gabapentin (NEURONTIN) 100 MG capsule Take 100 mg by mouth daily.     metoprolol succinate (TOPROL-XL) 25 MG 24 hr tablet Take 1 tablet (25 mg total) by mouth 2 (two) times daily. 180 tablet 0   sacubitril-valsartan (ENTRESTO) 97-103 MG Take 1 tablet by mouth 2 (two) times daily. 180 tablet 0   spironolactone (ALDACTONE) 25 MG tablet Take 0.5 tablets (12.5 mg total) by mouth at bedtime. 15 tablet 0   spironolactone (ALDACTONE) 25 MG tablet Take 0.5 tablets (12.5 mg total) by mouth at bedtime. 45 tablet 3   No current facility-administered medications for this visit.    OBJECTIVE: Vitals:   10/11/23 0832  BP: 110/66  Pulse: (!) 59  Resp: 16  Temp: (!) 97.3 F (36.3 C)  SpO2: 98%     Body mass index is 34.54 kg/m.    ECOG FS:0 - Asymptomatic  General: Well-developed, well-nourished, no acute distress. Eyes: Pink conjunctiva, anicteric sclera. HEENT: Normocephalic, moist  mucous membranes. Lungs: No audible wheezing or coughing. Heart: Regular rate and rhythm. Abdomen: Soft, nontender, no obvious distention. Musculoskeletal: No edema, cyanosis, or clubbing. Neuro: Alert, answering all questions appropriately. Cranial nerves grossly intact. Skin: No rashes or petechiae noted. Psych: Normal affect.  LAB RESULTS:  Lab Results  Component Value Date   NA 138 02/05/2022   K 5.2 (H) 02/05/2022   CL 108 02/05/2022   CO2 24 02/05/2022   GLUCOSE 111 (H) 02/05/2022   BUN 31 (H) 02/05/2022   CREATININE 1.59 (H) 02/05/2022   CALCIUM 9.1 02/05/2022   PROT 6.6 11/03/2020   ALBUMIN 4.1 11/03/2020   AST 19 11/03/2020   ALT 24 11/03/2020   ALKPHOS 39 11/03/2020   BILITOT 1.3 (H) 11/03/2020   GFRNONAA 43 (L) 02/05/2022   GFRAA 60 01/05/2021    Lab Results  Component Value Date   WBC 5.5 09/20/2023   NEUTROABS 4.3 03/10/2013   HGB 13.6 09/20/2023   HCT 41.6 09/20/2023  MCV 101.2 (H) 09/20/2023   PLT 106 (L) 09/20/2023     STUDIES: No results found.  ASSESSMENT: Thrombocytopenia.  PLAN:    Thrombocytopenia: Patient's platelet count is decreased, but stable at 106.  All of his other laboratory work including [employer] myeloid panel is either negative or within normal limits.  He has a mildly elevated M spike that is likely clinically insignificant.  No other mention is needed.  Patient does not require bone marrow biopsy.  Return to clinic in 6 months with repeat laboratory work and further evaluation. Anemia: Resolved.   MGUS: Patient noted to have an M spike of 0.2.  Likely clinically insignificant.  Will repeat in 6 months.  Patient expressed understanding and was in agreement with this plan. He also understands that He can call clinic at any time with any questions, concerns, or complaints.    Jeralyn Ruths, MD   10/11/2023 3:06 PM

## 2023-10-14 ENCOUNTER — Ambulatory Visit: Payer: PPO

## 2023-10-14 DIAGNOSIS — I639 Cerebral infarction, unspecified: Secondary | ICD-10-CM

## 2023-10-14 LAB — CUP PACEART REMOTE DEVICE CHECK
Date Time Interrogation Session: 20241116000059
Implantable Pulse Generator Implant Date: 20220420

## 2023-10-29 ENCOUNTER — Other Ambulatory Visit: Payer: Self-pay

## 2023-10-29 MED ORDER — METOPROLOL SUCCINATE ER 25 MG PO TB24: 25.0000 mg | ORAL_TABLET | Freq: Two times a day (BID) | ORAL | 0 refills | Status: DC

## 2023-11-06 NOTE — Progress Notes (Signed)
Carelink Summary Report / Loop Recorder 

## 2023-11-18 ENCOUNTER — Ambulatory Visit (INDEPENDENT_AMBULATORY_CARE_PROVIDER_SITE_OTHER): Payer: PPO

## 2023-11-18 DIAGNOSIS — I639 Cerebral infarction, unspecified: Secondary | ICD-10-CM | POA: Diagnosis not present

## 2023-11-18 LAB — CUP PACEART REMOTE DEVICE CHECK
Date Time Interrogation Session: 20241223000850
Implantable Pulse Generator Implant Date: 20220420

## 2023-11-22 ENCOUNTER — Telehealth: Payer: Self-pay | Admitting: Cardiology

## 2023-11-22 ENCOUNTER — Other Ambulatory Visit: Payer: Self-pay

## 2023-11-22 MED ORDER — EMPAGLIFLOZIN 10 MG PO TABS: 10.0000 mg | ORAL_TABLET | Freq: Every day | ORAL | 3 refills | Status: DC

## 2023-11-25 ENCOUNTER — Telehealth: Payer: Self-pay | Admitting: Cardiology

## 2023-11-25 NOTE — Telephone Encounter (Signed)
Pt c/o medication issue:  1. Name of Medication:   empagliflozin (JARDIANCE) 10 MG TABS tablet    2. How are you currently taking this medication (dosage and times per day)? N/A  3. Are you having a reaction (difficulty breathing--STAT)? No   4. What is your medication issue? Patient states the pharmacy advised it would cost over $400 to pick up this medication when he normally gets it for free.   Please advise.

## 2023-11-25 NOTE — Telephone Encounter (Signed)
Patient is returning call.  °

## 2023-11-25 NOTE — Telephone Encounter (Signed)
Left voicemail message to call back  

## 2023-11-26 NOTE — Telephone Encounter (Signed)
 [employer] and they will get his medication sent out to him.   Called patient and left voicemail message for him to call back.  Called Mobile number and reviewed all information with patient. He verbalized understanding with no further questions at this time.

## 2023-11-26 NOTE — Telephone Encounter (Signed)
 Patient came in the office today to review his medications and assistance. He went to pick up his Jardiance  and it was $400 which he is not able to afford. He also needs refill of his Entresto . Advised that I would reach out to [employer] for the Entresto . Advised that he try to get the Jardiance  filled later this week after the 1st to see if insurance will be cover it better and if not then to please call me back and we can try to get assistance paperwork completed. Also advised that for assistance with medications they are now requiring people to apply to low subsidy income and provided him with that information. Reviewed that if he doesn't have someone to assist with that to give me a call back.

## 2023-11-28 ENCOUNTER — Telehealth: Payer: Self-pay | Admitting: Cardiology

## 2023-11-28 NOTE — Telephone Encounter (Signed)
 Pt c/o medication issue:  1. Name of Medication:   empagliflozin  (JARDIANCE ) 10 MG TABS tablet   2. How are you currently taking this medication (dosage and times per day)?   3. Are you having a reaction (difficulty breathing--STAT)?   4. What is your medication issue?   Wife Ed) called stating patient is completely out of this medication and cannot afford to get a refill.  Wife noted patient has not received any medication from the company as yet and wants call back to discuss next steps.

## 2023-11-28 NOTE — Telephone Encounter (Signed)
 Spoke with patient and advised that I will have application to sign but that he also needs to apply for medicare extra help. (I gave him information to apply for this 12/31). He verbalized understanding with no further questions at this time.   Application placed in my Current pending assistance file on top of my cart.

## 2023-11-28 NOTE — Telephone Encounter (Signed)
 Returned the call to the patient's wife, per the dpr. She stated that she went to pick up the Jardiance and it was still $400. Message has been routed to see about assistance.

## 2023-11-29 NOTE — Telephone Encounter (Signed)
 Application completed and faxed. No further needs.

## 2023-11-29 NOTE — Telephone Encounter (Signed)
 Called [employer] to follow up on prescription shipment. They did not send it out but they will send out a gratitude prescription and he should get it Monday. Called and updated patient and also told him they requested income for both him and his wife. He verbalized understanding and stated he would bring that in today. Instructed him to ask for Sharlet when he gets here so I can review information with him.

## 2023-12-05 ENCOUNTER — Telehealth: Payer: Self-pay | Admitting: Cardiology

## 2023-12-05 NOTE — Telephone Encounter (Signed)
 Spoke with patient and reviewed that he should receive it on 1/13 and to call me back if he does not get it.

## 2023-12-05 NOTE — Telephone Encounter (Signed)
 Called and spoke with Personal assistant at [employer]. Patient has been approved for free Entresto until the end of 2025. Prescription has been sent out high priority and will delivered Monday 1/13. Will reach back out to patient to review this information with him.

## 2023-12-05 NOTE — Telephone Encounter (Signed)
 Spoke with patient and he has not received his Entresto. [employer] spoke with Dewayne Hatch about refill. Previously we had called [employer] twice and reviewed with them that patient still has not got his medication. Advised that I would call then again.

## 2023-12-05 NOTE — Telephone Encounter (Signed)
 New Message:    Patient wants to talk to [facility]. Patient wants to check on his medicine. Patient says he still have not received his medicine.

## 2023-12-05 NOTE — Telephone Encounter (Signed)
 Spoke with Dewayne Hatch at [employer]. She does see where we had called and reports that I need to speak with pharmacy but they do not open until 9 am and to call back.

## 2023-12-17 NOTE — Telephone Encounter (Signed)
Made in error

## 2023-12-23 ENCOUNTER — Ambulatory Visit (INDEPENDENT_AMBULATORY_CARE_PROVIDER_SITE_OTHER): Payer: PPO

## 2023-12-23 DIAGNOSIS — I639 Cerebral infarction, unspecified: Secondary | ICD-10-CM | POA: Diagnosis not present

## 2023-12-23 LAB — CUP PACEART REMOTE DEVICE CHECK
Date Time Interrogation Session: 20250127000248
Implantable Pulse Generator Implant Date: 20220420

## 2023-12-24 ENCOUNTER — Encounter: Payer: Self-pay | Admitting: Cardiology

## 2023-12-24 NOTE — Progress Notes (Signed)
Carelink Summary Report / Loop Recorder

## 2023-12-30 DIAGNOSIS — M51362 Other intervertebral disc degeneration, lumbar region with discogenic back pain and lower extremity pain: Secondary | ICD-10-CM | POA: Diagnosis not present

## 2023-12-30 DIAGNOSIS — G8929 Other chronic pain: Secondary | ICD-10-CM | POA: Diagnosis not present

## 2023-12-30 DIAGNOSIS — M461 Sacroiliitis, not elsewhere classified: Secondary | ICD-10-CM | POA: Diagnosis not present

## 2023-12-30 DIAGNOSIS — M1611 Unilateral primary osteoarthritis, right hip: Secondary | ICD-10-CM | POA: Diagnosis not present

## 2023-12-30 DIAGNOSIS — M47816 Spondylosis without myelopathy or radiculopathy, lumbar region: Secondary | ICD-10-CM | POA: Diagnosis not present

## 2023-12-30 DIAGNOSIS — M5441 Lumbago with sciatica, right side: Secondary | ICD-10-CM | POA: Diagnosis not present

## 2024-01-27 ENCOUNTER — Other Ambulatory Visit: Payer: Self-pay | Admitting: *Deleted

## 2024-01-27 ENCOUNTER — Ambulatory Visit (INDEPENDENT_AMBULATORY_CARE_PROVIDER_SITE_OTHER): Payer: PPO

## 2024-01-27 DIAGNOSIS — I639 Cerebral infarction, unspecified: Secondary | ICD-10-CM

## 2024-01-27 LAB — CUP PACEART REMOTE DEVICE CHECK
Date Time Interrogation Session: 20250303000304
Implantable Pulse Generator Implant Date: 20220420

## 2024-01-27 MED ORDER — METOPROLOL SUCCINATE ER 25 MG PO TB24: 25.0000 mg | ORAL_TABLET | Freq: Two times a day (BID) | ORAL | 2 refills | Status: DC

## 2024-01-28 ENCOUNTER — Encounter: Payer: Self-pay | Admitting: Cardiology

## 2024-01-30 NOTE — Progress Notes (Signed)
 Carelink Summary Report / Loop Recorder

## 2024-02-27 NOTE — Progress Notes (Signed)
 Carelink Summary Report / Loop Recorder

## 2024-03-02 ENCOUNTER — Ambulatory Visit: Payer: PPO

## 2024-03-02 DIAGNOSIS — I639 Cerebral infarction, unspecified: Secondary | ICD-10-CM | POA: Diagnosis not present

## 2024-03-02 LAB — CUP PACEART REMOTE DEVICE CHECK
Date Time Interrogation Session: 20250407000341
Implantable Pulse Generator Implant Date: 20220420

## 2024-03-05 ENCOUNTER — Encounter: Payer: Self-pay | Admitting: Cardiology

## 2024-03-05 ENCOUNTER — Ambulatory Visit: Attending: Cardiology | Admitting: Cardiology

## 2024-03-05 VITALS — BP 110/60 | HR 62 | Ht 66.0 in | Wt 211.2 lb

## 2024-03-05 DIAGNOSIS — I639 Cerebral infarction, unspecified: Secondary | ICD-10-CM | POA: Diagnosis not present

## 2024-03-05 DIAGNOSIS — H43812 Vitreous degeneration, left eye: Secondary | ICD-10-CM | POA: Diagnosis not present

## 2024-03-05 DIAGNOSIS — Z95818 Presence of other cardiac implants and grafts: Secondary | ICD-10-CM | POA: Diagnosis not present

## 2024-03-05 DIAGNOSIS — I5020 Unspecified systolic (congestive) heart failure: Secondary | ICD-10-CM | POA: Diagnosis not present

## 2024-03-05 DIAGNOSIS — Z961 Presence of intraocular lens: Secondary | ICD-10-CM | POA: Diagnosis not present

## 2024-03-05 NOTE — Patient Instructions (Signed)
 Medication Instructions:  No changes at this time.   *If you need a refill on your cardiac medications before your next appointment, please call your pharmacy*  Lab Work: None  If you have labs (blood work) drawn today and your tests are completely normal, you will receive your results only by: MyChart Message (if you have MyChart) OR A paper copy in the mail If you have any lab test that is abnormal or we need to change your treatment, we will call you to review the results.  Testing/Procedures: None  Follow-Up: At [facility], you and your health needs are our priority.  As part of our continuing mission to provide you with exceptional heart care, our providers are all part of one team.  This team includes your primary Cardiologist (physician) and Advanced Practice Providers or APPs (Physician Assistants and Nurse Practitioners) who all work together to provide you with the care you need, when you need it.  Your next appointment:   As Needed with EP  We will try to move up your appointment up to June with Dr. Azucena Cecil  Provider:   Debbe Odea, MD

## 2024-03-05 NOTE — Progress Notes (Signed)
 Electrophysiology Clinic Note    Date:  03/05/2024  Patient ID:  Gabriel Martin, DOB [birth year], MRN [id number] PCP:  Barbette Reichmann, MD  Cardiologist:  Debbe Odea, MD Electrophysiologist: Lanier Prude, MD   Discussed the use of AI scribe software for clinical note transcription with the patient, who gave verbal consent to proceed.   Patient Profile    Chief Complaint: ILR follow-up  History of Present Illness: Gabriel Martin is a 83 y.o. male with PMH notable for NICM, cryptogenic stroke s/p ILR, PVCs, HTN; seen today for Lanier Prude, MD for routine electrophysiology followup.   He last saw Dr. Lalla Brothers 11/2022 for discussion of ICD. Did not recommend ICD given patient's age. He had routine follow-up with Dr. Myriam Forehand 06/2023, euvolemic with improved BP control.   On follow-up today, he does not have any significant cardiac concerns. He denies chest pain, chest pressure, palpitations. He does have fatigue, but questions whether that is because he is not particularly active during the day. He also has occasional dizziness if he bends down and stands up quickly. He is able to grocery shop without any symptoms.     Arrhythmia/Device History MDT ILR, imp 02/2021; dx cryptogenic stroke     ROS:  Please see the history of present illness. All other systems are reviewed and otherwise negative.    Physical Exam    VS:  BP 110/60 (BP Location: Left Arm, Patient Position: Sitting, Cuff Size: Normal)   Pulse 62   Ht 5\' 6"  (1.676 m)   Wt 211 lb 4 oz (95.8 kg)   SpO2 95%   BMI 34.10 kg/m  BMI: Body mass index is 34.1 kg/m.  Wt Readings from Last 3 Encounters:  03/05/24 211 lb 4 oz (95.8 kg)  10/11/23 214 lb (97.1 kg)  09/20/23 214 lb (97.1 kg)     GEN- The patient is well appearing, alert and oriented x 3 today.   Lungs- Clear to ausculation bilaterally, normal work of breathing.  Heart- Regular rate and rhythm, no murmurs, rubs or  gallops Extremities- No peripheral edema, warm, dry Skin-  ILR insertion site well-healed\   4/10 remote Device interrogation reviewed by myself:  Battery good Frequent pause episodes noted, episodes with ventricular undersensing PVC burden 3.8% No AFib   Studies Reviewed   Previous EP, cardiology notes.    EKG is ordered. Personal review of EKG from today shows:    EKG Interpretation Date/Time:  Thursday March 05 2024 13:55:31 EDT Ventricular Rate:  62 PR Interval:  212 QRS Duration:  108 QT Interval:  466 QTC Calculation: 472 R Axis:   -48  Text Interpretation: Sinus rhythm with 1st degree A-V block Indeterminate axis Inferior infarct (cited on or before 16-Jul-2023) When compared with ECG of 16-Jul-2023 08:36, No significant change was found Confirmed by Sherie Don [id number]) on 03/05/2024 2:02:34 PM    Cardiac MRI, 01/02/2023 1. Moderate to severely reduced LV function, LVEF 31%.  2. Midwall diffuse LGE (scar) in the basal-mid left ventricular myocardium.  3. Severely reduced RV function.  4. Mild MR, mild PR.  5. Findings consistent with Non-ischemic cardiomyopathy.  TTE, 09/26/2022  1. Left ventricular ejection fraction, by estimation, is 30 to 35%. The left ventricle has moderate to severely decreased function. The left ventricular internal cavity size was mildly dilated. Left ventricular diastolic parameters are consistent with Grade I diastolic dysfunction (impaired relaxation).   2. Right ventricular systolic function is moderately reduced.   3.  Mild mitral valve regurgitation.    Assessment and Plan     #) cryptogenic stroke #) ILR in situ No AFib appreciated on ILR Continue monthly remote monitoring   #) HFrEF NYHA III symptoms Warm and dry on exam GDMT: spiro 12.5, entresto 97-103, toprol 25, jardiance 10 Continue follow-up with general cardiology         Current medicines are reviewed at length with the patient today.   The patient does not  have concerns regarding his medicines.  The following changes were made today:  none  Labs/ tests ordered today include:  Orders Placed This Encounter  Procedures   EKG 12-Lead     Disposition: Follow up with Dr. Lalla Brothers or EP APP  PRN, continue monthly remote ILR monitoring    Signed, Sherie Don, NP  03/05/24  4:16 PM  Electrophysiology [facility]

## 2024-03-07 ENCOUNTER — Encounter: Payer: Self-pay | Admitting: Cardiology

## 2024-03-09 DIAGNOSIS — D696 Thrombocytopenia, unspecified: Secondary | ICD-10-CM | POA: Diagnosis not present

## 2024-03-09 DIAGNOSIS — M5441 Lumbago with sciatica, right side: Secondary | ICD-10-CM | POA: Diagnosis not present

## 2024-03-09 DIAGNOSIS — Z8739 Personal history of other diseases of the musculoskeletal system and connective tissue: Secondary | ICD-10-CM | POA: Diagnosis not present

## 2024-03-09 DIAGNOSIS — I428 Other cardiomyopathies: Secondary | ICD-10-CM | POA: Diagnosis not present

## 2024-03-09 DIAGNOSIS — Z0000 Encounter for general adult medical examination without abnormal findings: Secondary | ICD-10-CM | POA: Diagnosis not present

## 2024-03-09 DIAGNOSIS — G8929 Other chronic pain: Secondary | ICD-10-CM | POA: Diagnosis not present

## 2024-03-09 DIAGNOSIS — N182 Chronic kidney disease, stage 2 (mild): Secondary | ICD-10-CM | POA: Diagnosis not present

## 2024-03-09 DIAGNOSIS — D649 Anemia, unspecified: Secondary | ICD-10-CM | POA: Diagnosis not present

## 2024-03-09 DIAGNOSIS — R7989 Other specified abnormal findings of blood chemistry: Secondary | ICD-10-CM | POA: Diagnosis not present

## 2024-03-09 DIAGNOSIS — Z125 Encounter for screening for malignant neoplasm of prostate: Secondary | ICD-10-CM | POA: Diagnosis not present

## 2024-03-09 DIAGNOSIS — Z6834 Body mass index (BMI) 34.0-34.9, adult: Secondary | ICD-10-CM | POA: Diagnosis not present

## 2024-03-16 DIAGNOSIS — Z08 Encounter for follow-up examination after completed treatment for malignant neoplasm: Secondary | ICD-10-CM | POA: Diagnosis not present

## 2024-03-16 DIAGNOSIS — K219 Gastro-esophageal reflux disease without esophagitis: Secondary | ICD-10-CM | POA: Diagnosis not present

## 2024-03-16 DIAGNOSIS — I428 Other cardiomyopathies: Secondary | ICD-10-CM | POA: Diagnosis not present

## 2024-03-16 DIAGNOSIS — Z0000 Encounter for general adult medical examination without abnormal findings: Secondary | ICD-10-CM | POA: Diagnosis not present

## 2024-03-16 DIAGNOSIS — R0683 Snoring: Secondary | ICD-10-CM | POA: Diagnosis not present

## 2024-03-16 DIAGNOSIS — Z85828 Personal history of other malignant neoplasm of skin: Secondary | ICD-10-CM | POA: Diagnosis not present

## 2024-03-16 DIAGNOSIS — N183 Chronic kidney disease, stage 3 unspecified: Secondary | ICD-10-CM | POA: Diagnosis not present

## 2024-03-16 DIAGNOSIS — I5020 Unspecified systolic (congestive) heart failure: Secondary | ICD-10-CM | POA: Diagnosis not present

## 2024-03-16 DIAGNOSIS — R7309 Other abnormal glucose: Secondary | ICD-10-CM | POA: Diagnosis not present

## 2024-03-16 DIAGNOSIS — L821 Other seborrheic keratosis: Secondary | ICD-10-CM | POA: Diagnosis not present

## 2024-03-16 DIAGNOSIS — Z6833 Body mass index (BMI) 33.0-33.9, adult: Secondary | ICD-10-CM | POA: Diagnosis not present

## 2024-03-16 DIAGNOSIS — D696 Thrombocytopenia, unspecified: Secondary | ICD-10-CM | POA: Diagnosis not present

## 2024-03-16 DIAGNOSIS — M79645 Pain in left finger(s): Secondary | ICD-10-CM | POA: Diagnosis not present

## 2024-03-16 DIAGNOSIS — Z872 Personal history of diseases of the skin and subcutaneous tissue: Secondary | ICD-10-CM | POA: Diagnosis not present

## 2024-03-16 DIAGNOSIS — B079 Viral wart, unspecified: Secondary | ICD-10-CM | POA: Diagnosis not present

## 2024-03-16 DIAGNOSIS — Z09 Encounter for follow-up examination after completed treatment for conditions other than malignant neoplasm: Secondary | ICD-10-CM | POA: Diagnosis not present

## 2024-03-16 DIAGNOSIS — R400 Somnolence: Secondary | ICD-10-CM | POA: Diagnosis not present

## 2024-03-16 DIAGNOSIS — R5381 Other malaise: Secondary | ICD-10-CM | POA: Diagnosis not present

## 2024-03-16 DIAGNOSIS — D485 Neoplasm of uncertain behavior of skin: Secondary | ICD-10-CM | POA: Diagnosis not present

## 2024-03-16 DIAGNOSIS — Z8739 Personal history of other diseases of the musculoskeletal system and connective tissue: Secondary | ICD-10-CM | POA: Diagnosis not present

## 2024-03-16 DIAGNOSIS — L570 Actinic keratosis: Secondary | ICD-10-CM | POA: Diagnosis not present

## 2024-04-06 ENCOUNTER — Ambulatory Visit: Payer: PPO

## 2024-04-06 DIAGNOSIS — I639 Cerebral infarction, unspecified: Secondary | ICD-10-CM

## 2024-04-06 LAB — CUP PACEART REMOTE DEVICE CHECK
Date Time Interrogation Session: 20250512000711
Implantable Pulse Generator Implant Date: 20220420

## 2024-04-09 ENCOUNTER — Inpatient Hospital Stay: Payer: PPO | Admitting: Oncology

## 2024-04-09 ENCOUNTER — Encounter: Payer: Self-pay | Admitting: Oncology

## 2024-04-09 ENCOUNTER — Inpatient Hospital Stay: Payer: PPO | Attending: Oncology

## 2024-04-09 VITALS — BP 101/65 | HR 59 | Temp 98.6°F | Resp 16 | Wt 211.5 lb

## 2024-04-09 DIAGNOSIS — D696 Thrombocytopenia, unspecified: Secondary | ICD-10-CM | POA: Insufficient documentation

## 2024-04-09 LAB — CBC WITH DIFFERENTIAL/PLATELET
Abs Immature Granulocytes: 0.04 10*3/uL (ref 0.00–0.07)
Basophils Absolute: 0 10*3/uL (ref 0.0–0.1)
Basophils Relative: 1 %
Eosinophils Absolute: 0.3 10*3/uL (ref 0.0–0.5)
Eosinophils Relative: 5 %
HCT: 42.6 % (ref 39.0–52.0)
Hemoglobin: 13.6 g/dL (ref 13.0–17.0)
Immature Granulocytes: 1 %
Lymphocytes Relative: 16 %
Lymphs Abs: 1 10*3/uL (ref 0.7–4.0)
MCH: 32.4 pg (ref 26.0–34.0)
MCHC: 31.9 g/dL (ref 30.0–36.0)
MCV: 101.4 fL — ABNORMAL HIGH (ref 80.0–100.0)
Monocytes Absolute: 0.8 10*3/uL (ref 0.1–1.0)
Monocytes Relative: 12 %
Neutro Abs: 4.3 10*3/uL (ref 1.7–7.7)
Neutrophils Relative %: 65 %
Platelets: 96 10*3/uL — ABNORMAL LOW (ref 150–400)
RBC: 4.2 MIL/uL — ABNORMAL LOW (ref 4.22–5.81)
RDW: 13 % (ref 11.5–15.5)
WBC: 6.4 10*3/uL (ref 4.0–10.5)
nRBC: 0 % (ref 0.0–0.2)

## 2024-04-09 NOTE — Progress Notes (Signed)
 [facility]  [phone] [phone]  ID: Gabriel Martin OB: May 28, 1941  MR#: [id number]  WUX#:[id number]  Patient Care Team: Antonio Baumgarten, MD as PCP - General (Internal Medicine) Constancia Delton, MD as PCP - Cardiology (Cardiology) Boyce Byes, MD as PCP - Electrophysiology (Cardiology) Shellie Dials, MD as Consulting Physician (Oncology)  CHIEF COMPLAINT: Thrombocytopenia.  INTERVAL HISTORY: Patient returns to clinic today for further evaluation and discussion of his laboratory results.  He continues to feel well and remains asymptomatic.  He has no neurologic complaints.  He denies any recent fevers or illnesses.  He has a good appetite and denies weight loss.  He has no chest pain, shortness of breath, cough, or hemoptysis.  He denies any nausea, vomiting, constipation, or diarrhea.  He has no urinary complaints.  Patient offers no specific complaints today.  REVIEW OF SYSTEMS:   Review of Systems  Constitutional: Negative.  Negative for fever, malaise/fatigue and weight loss.  Respiratory: Negative.  Negative for cough, hemoptysis and shortness of breath.   Cardiovascular: Negative.  Negative for chest pain and leg swelling.  Gastrointestinal: Negative.  Negative for abdominal pain, blood in stool and melena.  Genitourinary: Negative.  Negative for dysuria.  Musculoskeletal: Negative.  Negative for back pain.  Skin: Negative.  Negative for rash.  Neurological: Negative.  Negative for dizziness, focal weakness, weakness and headaches.  Psychiatric/Behavioral: Negative.  The patient is not nervous/anxious.     As per HPI. Otherwise, a complete review of systems is negative.  PAST MEDICAL HISTORY: Past Medical History:  Diagnosis Date   Acid reflux 05/12/2015   Anemia    Arthritis    finger thumb   Benign neoplasm of large bowel    Benign prostatic hyperplasia with urinary obstruction 05/12/2015   Change in blood  platelet count 05/12/2015   CHF (congestive heart failure) (HCC)    Chronic kidney disease 05/12/2015   GERD (gastroesophageal reflux disease)    HLD (hyperlipidemia) 05/12/2015   Hyperlipidemia    Nocturia associated with benign prostatic hypertrophy    Penile ulcer 05/12/2015   Skin cancer    Stroke (HCC)    Thrombocytopenia (HCC)     PAST SURGICAL HISTORY: Past Surgical History:  Procedure Laterality Date   CATARACT EXTRACTION W/PHACO Left 02/29/2016   Procedure: CATARACT EXTRACTION PHACO AND INTRAOCULAR LENS PLACEMENT (IOC) left;  Surgeon: Annell Kidney, MD;  Location: [facility];  Service: Ophthalmology;  Laterality: Left;   CATARACT EXTRACTION W/PHACO Right 04/25/2016   Procedure: CATARACT EXTRACTION PHACO AND INTRAOCULAR LENS PLACEMENT (IOC);  Surgeon: Annell Kidney, MD;  Location: [facility];  Service: Ophthalmology;  Laterality: Right;   COLONOSCOPY WITH PROPOFOL  N/A 07/01/2017   Procedure: COLONOSCOPY WITH PROPOFOL ;  Surgeon: Cassie Click, MD;  Location: [facility] ENDOSCOPY;  Service: Endoscopy;  Laterality: N/A;   EYE SURGERY     HEMORRHOID SURGERY     RIGHT/LEFT HEART CATH AND CORONARY ANGIOGRAPHY Bilateral 01/10/2021   Procedure: RIGHT/LEFT HEART CATH AND CORONARY ANGIOGRAPHY;  Surgeon: Sammy Crisp, MD;  Location: [facility] INVASIVE CV LAB;  Service: Cardiovascular;  Laterality: Bilateral;   TEE WITHOUT CARDIOVERSION N/A 11/30/2020   Procedure: TRANSESOPHAGEAL ECHOCARDIOGRAM (TEE);  Surgeon: Constancia Delton, MD;  Location: [facility] ORS;  Service: Cardiovascular;  Laterality: N/A;    FAMILY HISTORY: Family History  Problem Relation Age of Onset   Heart disease Brother    Heart attack Brother 32   Heart attack Mother    Heart attack Nephew 37  ADVANCED DIRECTIVES (Y/N):  N  HEALTH MAINTENANCE: Social History   Tobacco Use   Smoking status: Former    Current packs/day: 0.00    Types: Cigarettes    Quit date: 11/26/1978    Years since  quitting: 45.4   Smokeless tobacco: Never   Tobacco comments:    quit 40 years ago  Vaping Use   Vaping status: Never Used  Substance Use Topics   Alcohol  use: Yes    Alcohol /week: 6.0 standard drinks of alcohol     Types: 6 Cans of beer per week    Comment: occasional   Drug use: No     Colonoscopy:  PAP:  Bone density:  Lipid panel:  No Known Allergies  Current Outpatient Medications  Medication Sig Dispense Refill   acetaminophen  (TYLENOL ) 500 MG tablet Take 500 mg by mouth every 6 (six) hours as needed.     allopurinol (ZYLOPRIM) 100 MG tablet Take 100 mg by mouth daily.     aspirin  EC 81 MG EC tablet Take 1 tablet (81 mg total) by mouth daily. Swallow whole. 30 tablet 0   atorvastatin  (LIPITOR) 40 MG tablet Take 1 tablet (40 mg total) by mouth daily at 6 PM. 30 tablet 0   cyanocobalamin  (VITAMIN B12) 1000 MCG tablet Take 1,000 mcg by mouth daily.     empagliflozin  (JARDIANCE ) 10 MG TABS tablet Take 1 tablet (10 mg total) by mouth daily before breakfast. 90 tablet 3   famotidine  (PEPCID ) 20 MG tablet Take 20 mg by mouth daily.     gabapentin  (NEURONTIN ) 100 MG capsule Take 100 mg by mouth daily.     metoprolol  succinate (TOPROL -XL) 25 MG 24 hr tablet Take 1 tablet (25 mg total) by mouth 2 (two) times daily. 180 tablet 2   sacubitril-valsartan (ENTRESTO ) 97-103 MG Take 1 tablet by mouth 2 (two) times daily. 180 tablet 0   spironolactone  (ALDACTONE ) 25 MG tablet Take 0.5 tablets (12.5 mg total) by mouth at bedtime. 15 tablet 0   No current facility-administered medications for this visit.    OBJECTIVE: Vitals:   04/09/24 0841  BP: 101/65  Pulse: (!) 59  Resp: 16  Temp: 98.6 F (37 C)  SpO2: 95%     Body mass index is 34.14 kg/m.    ECOG FS:0 - Asymptomatic  General: Well-developed, well-nourished, no acute distress. Eyes: Pink conjunctiva, anicteric sclera. HEENT: Normocephalic, moist mucous membranes. Lungs: No audible wheezing or coughing. Heart: Regular rate  and rhythm. Abdomen: Soft, nontender, no obvious distention. Musculoskeletal: No edema, cyanosis, or clubbing. Neuro: Alert, answering all questions appropriately. Cranial nerves grossly intact. Skin: No rashes or petechiae noted. Psych: Normal affect.  LAB RESULTS:  Lab Results  Component Value Date   NA 138 02/05/2022   K 5.2 (H) 02/05/2022   CL 108 02/05/2022   CO2 24 02/05/2022   GLUCOSE 111 (H) 02/05/2022   BUN 31 (H) 02/05/2022   CREATININE 1.59 (H) 02/05/2022   CALCIUM  9.1 02/05/2022   PROT 6.6 11/03/2020   ALBUMIN 4.1 11/03/2020   AST 19 11/03/2020   ALT 24 11/03/2020   ALKPHOS 39 11/03/2020   BILITOT 1.3 (H) 11/03/2020   GFRNONAA 43 (L) 02/05/2022   GFRAA 60 01/05/2021    Lab Results  Component Value Date   WBC 6.4 04/09/2024   NEUTROABS 4.3 04/09/2024   HGB 13.6 04/09/2024   HCT 42.6 04/09/2024   MCV 101.4 (H) 04/09/2024   PLT 96 (L) 04/09/2024     STUDIES: CUP  PACEART REMOTE DEVICE CHECK Result Date: 04/06/2024 ILR summary report received. Battery status OK. Normal device function. No new symptom, tachy, brady, or pause episodes. No new AF episodes. Monthly summary reports and ROV/PRN - CS, CVRS   ASSESSMENT: Thrombocytopenia.  PLAN:    Thrombocytopenia: Chronic and unchanged.  Patient's platelet count remains decreased, but stable at 96.  His platelet count has ranged between 70 and 116 since April 2014.  Previously, all of his other laboratory work including [employer] myeloid panel is either negative or within normal limits.  He has a mildly elevated M spike that is likely clinically insignificant.  No intervention is needed.  Patient does not require bone marrow biopsy.  After discussion with the patient, it was agreed upon that no further follow-up is necessary.  Recommend monitoring platelet count 1-2 times per year and refer patient back if there are any questions or concerns. Anemia: Resolved.   MGUS: Patient previously noted to have an M spike of  0.2.  This is likely clinically insignificant.  Repeat testing from today is pending at time of dictation.  I spent a total of 20 minutes reviewing chart data, face-to-face evaluation with the patient, counseling and coordination of care as detailed above.   Patient expressed understanding and was in agreement with this plan. He also understands that He can call clinic at any time with any questions, concerns, or complaints.    Shellie Dials, MD   04/09/2024 8:46 AM

## 2024-04-12 ENCOUNTER — Ambulatory Visit: Payer: Self-pay | Admitting: Cardiology

## 2024-04-13 LAB — PROTEIN ELECTROPHORESIS, SERUM
A/G Ratio: 1.4 (ref 0.7–1.7)
Albumin ELP: 3.6 g/dL (ref 2.9–4.4)
Alpha-1-Globulin: 0.2 g/dL (ref 0.0–0.4)
Alpha-2-Globulin: 0.7 g/dL (ref 0.4–1.0)
Beta Globulin: 0.8 g/dL (ref 0.7–1.3)
Gamma Globulin: 0.8 g/dL (ref 0.4–1.8)
Globulin, Total: 2.5 g/dL (ref 2.2–3.9)
Total Protein ELP: 6.1 g/dL (ref 6.0–8.5)

## 2024-04-13 NOTE — Progress Notes (Signed)
 Carelink Summary Report / Loop Recorder

## 2024-04-23 ENCOUNTER — Encounter: Payer: Self-pay | Admitting: Cardiology

## 2024-04-23 ENCOUNTER — Ambulatory Visit: Attending: Cardiology | Admitting: Cardiology

## 2024-04-23 VITALS — BP 104/65 | HR 60 | Ht 66.0 in | Wt 212.0 lb

## 2024-04-23 DIAGNOSIS — Z95818 Presence of other cardiac implants and grafts: Secondary | ICD-10-CM

## 2024-04-23 DIAGNOSIS — I5020 Unspecified systolic (congestive) heart failure: Secondary | ICD-10-CM

## 2024-04-23 NOTE — Progress Notes (Signed)
 Cardiology Office Note:    Date:  04/23/2024   ID:  Gabriel Martin, DOB 03/07/1941, MRN [id number]  PCP:  Antonio Baumgarten, MD  [facility] Cardiologist:  Constancia Delton, MD  [facility] Electrophysiologist:  Boyce Byes, MD   Referring MD: Antonio Baumgarten, MD   Chief Complaint  Patient presents with   12 month follow up     "Doing well."     History of Present Illness:    Gabriel Martin is a 83 y.o. male with a hx of hypertension, NICM, EF 30 to 35%, CVA 10/2019 s/p ILR who presents for follow-up.    Patient denies chest pain or shortness of breath.  Compliant with medications as prescribed.  Blood pressures have stayed in the normal range.  Has no dizziness, presyncope or syncope.  Tolerating medications as prescribed.   Prior notes CMR 2/24 EF 31% Echo 09/2022 EF 30 to 35% Echocardiogram 08/2021 EF 30 to 35% Patient presented to the ED on 12/8 due to confusion, lightheadedness and loss consciousness, In the ED, work-up with an MRI showed 2 acute left MCA territory cortical infarcts.   Echo 10/2020 moderate to severely reduced ejection fraction, EF 30 to 35%, global hypokinesis.   Left heart cath 01/10/2021 mild nonobstructive CAD. CMR 03/2021 EF 27%.  Mid wall LGE.  Past Medical History:  Diagnosis Date   Acid reflux 05/12/2015   Anemia    Arthritis    finger thumb   Benign neoplasm of large bowel    Benign prostatic hyperplasia with urinary obstruction 05/12/2015   Change in blood platelet count 05/12/2015   CHF (congestive heart failure) (HCC)    Chronic kidney disease 05/12/2015   GERD (gastroesophageal reflux disease)    HLD (hyperlipidemia) 05/12/2015   Hyperlipidemia    Nocturia associated with benign prostatic hypertrophy    Penile ulcer 05/12/2015   Skin cancer    Stroke (HCC)    Thrombocytopenia [facility])     Past Surgical History:  Procedure Laterality Date   CATARACT EXTRACTION W/PHACO Left 02/29/2016   Procedure: CATARACT EXTRACTION  PHACO AND INTRAOCULAR LENS PLACEMENT (IOC) left;  Surgeon: Annell Kidney, MD;  Location: [facility];  Service: Ophthalmology;  Laterality: Left;   CATARACT EXTRACTION W/PHACO Right 04/25/2016   Procedure: CATARACT EXTRACTION PHACO AND INTRAOCULAR LENS PLACEMENT (IOC);  Surgeon: Annell Kidney, MD;  Location: [facility];  Service: Ophthalmology;  Laterality: Right;   COLONOSCOPY WITH PROPOFOL  N/A 07/01/2017   Procedure: COLONOSCOPY WITH PROPOFOL ;  Surgeon: Cassie Click, MD;  Location: [facility] ENDOSCOPY;  Service: Endoscopy;  Laterality: N/A;   EYE SURGERY     HEMORRHOID SURGERY     RIGHT/LEFT HEART CATH AND CORONARY ANGIOGRAPHY Bilateral 01/10/2021   Procedure: RIGHT/LEFT HEART CATH AND CORONARY ANGIOGRAPHY;  Surgeon: Sammy Crisp, MD;  Location: [facility] INVASIVE CV LAB;  Service: Cardiovascular;  Laterality: Bilateral;   TEE WITHOUT CARDIOVERSION N/A 11/30/2020   Procedure: TRANSESOPHAGEAL ECHOCARDIOGRAM (TEE);  Surgeon: Constancia Delton, MD;  Location: [facility] ORS;  Service: Cardiovascular;  Laterality: N/A;    Current Medications: Current Meds  Medication Sig   acetaminophen  (TYLENOL ) 500 MG tablet Take 500 mg by mouth every 6 (six) hours as needed.   allopurinol (ZYLOPRIM) 100 MG tablet Take 100 mg by mouth daily.   aspirin  EC 81 MG EC tablet Take 1 tablet (81 mg total) by mouth daily. Swallow whole.   atorvastatin  (LIPITOR) 40 MG tablet Take 1 tablet (40 mg total) by mouth daily at 6 PM.  cyanocobalamin  (VITAMIN B12) 1000 MCG tablet Take 1,000 mcg by mouth daily.   empagliflozin  (JARDIANCE ) 10 MG TABS tablet Take 1 tablet (10 mg total) by mouth daily before breakfast.   famotidine  (PEPCID ) 20 MG tablet Take 20 mg by mouth daily.   gabapentin  (NEURONTIN ) 100 MG capsule Take 100 mg by mouth daily.   metoprolol  succinate (TOPROL -XL) 25 MG 24 hr tablet Take 1 tablet (25 mg total) by mouth 2 (two) times daily.   sacubitril-valsartan (ENTRESTO ) 97-103 MG Take 1 tablet  by mouth 2 (two) times daily.   spironolactone  (ALDACTONE ) 25 MG tablet Take 0.5 tablets (12.5 mg total) by mouth at bedtime.     Allergies:   Patient has no known allergies.   Social History   Socioeconomic History   Marital status: Married    Spouse name: Not on file   Number of children: Not on file   Years of education: Not on file   Highest education level: Not on file  Occupational History   Not on file  Tobacco Use   Smoking status: Former    Current packs/day: 0.00    Types: Cigarettes    Quit date: 11/26/1978    Years since quitting: 45.4   Smokeless tobacco: Never   Tobacco comments:    quit 40 years ago  Vaping Use   Vaping status: Never Used  Substance and Sexual Activity   Alcohol  use: Yes    Alcohol /week: 6.0 standard drinks of alcohol     Types: 6 Cans of beer per week    Comment: occasional   Drug use: No   Sexual activity: Never  Other Topics Concern   Not on file  Social History Narrative   Not on file   Social Drivers of Health   Financial Resource Strain: Low Risk  (03/16/2024)   Received from [facility]   Overall Financial Resource Strain (CARDIA)    Difficulty of Paying Living Expenses: Not hard at all  Food Insecurity: No Food Insecurity (03/16/2024)   Received from [facility]   Hunger Vital Sign    Worried About Running Out of Food in the Last Year: Never true    Ran Out of Food in the Last Year: Never true  Transportation Needs: No Transportation Needs (03/16/2024)   Received from [facility] - Transportation    In the past 12 months, has lack of transportation kept you from medical appointments or from getting medications?: No    Lack of Transportation (Non-Medical): No  Physical Activity: Not on file  Stress: Not on file  Social Connections: Not on file     Family History: The patient's family history includes Heart attack in his mother; Heart attack (age of onset: 75)  in his nephew; Heart attack (age of onset: 53) in his brother; Heart disease in his brother.  ROS:   Please see the history of present illness.     All other systems reviewed and are negative.  EKGs/Labs/Other Studies Reviewed:    The following studies were reviewed today:  EKG Interpretation Date/Time:  Thursday Apr 23 2024 08:26:04 EDT Ventricular Rate:  60 PR Interval:  206 QRS Duration:  110 QT Interval:  442 QTC Calculation: 442 R Axis:   -26  Text Interpretation: Normal sinus rhythm Inferior infarct (cited on or before 16-Jul-2023) Confirmed by Constancia Delton ([id number]) on 04/23/2024 8:45:03 AM    Recent Labs: 04/09/2024: Hemoglobin 13.6; Platelets 96  Recent Lipid Panel  Component Value Date/Time   CHOL 137 11/04/2020 0526   TRIG 68 11/04/2020 0526   HDL 48 11/04/2020 0526   CHOLHDL 2.9 11/04/2020 0526   VLDL 14 11/04/2020 0526   LDLCALC 75 11/04/2020 0526     Risk Assessment/Calculations:      Physical Exam:    VS:  BP 104/65 (BP Location: Left Arm, Patient Position: Sitting, Cuff Size: Normal)   Pulse 60   Ht 5\' 6"  (1.676 m)   Wt 212 lb (96.2 kg)   SpO2 93%   BMI 34.22 kg/m     Wt Readings from Last 3 Encounters:  04/23/24 212 lb (96.2 kg)  04/09/24 211 lb 8 oz (95.9 kg)  03/05/24 211 lb 4 oz (95.8 kg)     GEN:  Well nourished, well developed in no acute distress HEENT: Normal NECK: No JVD; No carotid bruits CARDIAC: RRR, no murmurs, rubs, gallops RESPIRATORY:  Clear to auscultation without rales, wheezing or rhonchi  ABDOMEN: Soft, non-tender, non-distended MUSCULOSKELETAL:  No edema; No deformity  SKIN: Warm and dry NEUROLOGIC:  Alert and oriented x 3 PSYCHIATRIC:  Normal affect   ASSESSMENT:    1. HFrEF (heart failure with reduced ejection fraction) (HCC)   2. Implantable loop recorder present    PLAN:    In order of problems listed above:  Nonischemic cardiomyopathy, EF 30 to 35% on echo 11/23.  NYHA class II symptoms.  Patient  appears euvolemic.  Heart rate 61.  BP better with reducing Aldactone .  Continue Aldactone  12.5 mg daily, Entresto  97-103 mg twice daily, Toprol -XL 25 mg daily, jardiance  10 mg qd.   Previously evaluated by EP, not much data suggesting benefits of ICD in octogenarians. Hx of CVA 10/2020, ILR with no evidence for A-fib or flutter so far.  Cont aspirin , Lipitor.  Follow-up in 12 months   Medication Adjustments/Labs and Tests Ordered: Current medicines are reviewed at length with the patient today.  Concerns regarding medicines are outlined above.  Orders Placed This Encounter  Procedures   EKG 12-Lead   EKG 12-Lead   EKG 12-Lead     No orders of the defined types were placed in this encounter.    Patient Instructions  Medication Instructions:  Your Physician recommend you continue on your current medication as directed.    *If you need a refill on your cardiac medications before your next appointment, please call your pharmacy*  Lab Work: No labs ordered today   If you have labs (blood work) drawn today and your tests are completely normal, you will receive your results only by: MyChart Message (if you have MyChart) OR A paper copy in the mail If you have any lab test that is abnormal or we need to change your treatment, we will call you to review the results.  Testing/Procedures: No test ordered today   Follow-Up: At [facility], you and your health needs are our priority.  As part of our continuing mission to provide you with exceptional heart care, our providers are all part of one team.  This team includes your primary Cardiologist (physician) and Advanced Practice Providers or APPs (Physician Assistants and Nurse Practitioners) who all work together to provide you with the care you need, when you need it.  Your next appointment:   1 year(s)  Provider:   You may see Constancia Delton, MD or one of the following Advanced Practice Providers on your designated Care  Team:   Laneta Pintos, NP Gildardo Labrador, PA-C Varney Gentleman,  PA-C Cadence Gennaro Khat, PA-C Ronald Cockayne, NP Morey Ar, NP    We recommend signing up for the patient portal called "MyChart".  Sign up information is provided on this After Visit Summary.  MyChart is used to connect with patients for Virtual Visits (Telemedicine).  Patients are able to view lab/test results, encounter notes, upcoming appointments, etc.  Non-urgent messages can be sent to your provider as well.   To learn more about what you can do with MyChart, go to [email or web address].     Signed, Constancia Delton, MD  04/23/2024 10:06 AM    [facility]

## 2024-04-23 NOTE — Patient Instructions (Signed)

## 2024-05-07 ENCOUNTER — Ambulatory Visit (INDEPENDENT_AMBULATORY_CARE_PROVIDER_SITE_OTHER)

## 2024-05-07 DIAGNOSIS — I428 Other cardiomyopathies: Secondary | ICD-10-CM | POA: Diagnosis not present

## 2024-05-07 LAB — CUP PACEART REMOTE DEVICE CHECK
Date Time Interrogation Session: 20250612000318
Implantable Pulse Generator Implant Date: 20220420

## 2024-05-09 ENCOUNTER — Ambulatory Visit: Payer: Self-pay | Admitting: Cardiology

## 2024-05-20 NOTE — Progress Notes (Signed)
 Carelink Summary Report / Loop Recorder

## 2024-06-08 ENCOUNTER — Ambulatory Visit: Payer: Self-pay | Admitting: Cardiology

## 2024-06-08 ENCOUNTER — Ambulatory Visit (INDEPENDENT_AMBULATORY_CARE_PROVIDER_SITE_OTHER)

## 2024-06-08 DIAGNOSIS — I428 Other cardiomyopathies: Secondary | ICD-10-CM | POA: Diagnosis not present

## 2024-06-08 LAB — CUP PACEART REMOTE DEVICE CHECK
Date Time Interrogation Session: 20250714000335
Implantable Pulse Generator Implant Date: 20220420

## 2024-06-20 ENCOUNTER — Other Ambulatory Visit: Payer: Self-pay

## 2024-06-20 ENCOUNTER — Emergency Department: Admission: EM | Admit: 2024-06-20 | Discharge: 2024-06-20 | Disposition: A | Discharge status: Home / Self Care

## 2024-06-20 DIAGNOSIS — Y93H9 Activity, other involving exterior property and land maintenance, building and construction: Secondary | ICD-10-CM | POA: Insufficient documentation

## 2024-06-20 DIAGNOSIS — I509 Heart failure, unspecified: Secondary | ICD-10-CM | POA: Insufficient documentation

## 2024-06-20 DIAGNOSIS — W208XXA Other cause of strike by thrown, projected or falling object, initial encounter: Secondary | ICD-10-CM | POA: Diagnosis not present

## 2024-06-20 DIAGNOSIS — S0990XA Unspecified injury of head, initial encounter: Secondary | ICD-10-CM | POA: Diagnosis present

## 2024-06-20 DIAGNOSIS — I493 Ventricular premature depolarization: Secondary | ICD-10-CM

## 2024-06-20 DIAGNOSIS — Z23 Encounter for immunization: Secondary | ICD-10-CM | POA: Diagnosis not present

## 2024-06-20 DIAGNOSIS — N189 Chronic kidney disease, unspecified: Secondary | ICD-10-CM | POA: Diagnosis not present

## 2024-06-20 DIAGNOSIS — S0101XA Laceration without foreign body of scalp, initial encounter: Secondary | ICD-10-CM | POA: Insufficient documentation

## 2024-06-20 MED ORDER — TETANUS-DIPHTH-ACELL PERTUSSIS 5-2.5-18.5 LF-MCG/0.5 IM SUSY
0.5000 mL | PREFILLED_SYRINGE | Freq: Once | INTRAMUSCULAR | Status: AC
  Administered 2024-06-20: 0.5 mL via INTRAMUSCULAR
  Filled 2024-06-20: qty 0.5

## 2024-06-20 MED ORDER — LIDOCAINE-EPINEPHRINE-TETRACAINE (LET) TOPICAL GEL
3.0000 mL | Freq: Once | TOPICAL | Status: AC
  Administered 2024-06-20: 3 mL via TOPICAL
  Filled 2024-06-20: qty 3

## 2024-06-20 MED ORDER — ACETAMINOPHEN 500 MG PO TABS
1000.0000 mg | ORAL_TABLET | Freq: Once | ORAL | Status: AC
  Administered 2024-06-20: 1000 mg via ORAL
  Filled 2024-06-20: qty 2

## 2024-06-20 NOTE — ED Notes (Signed)
 Patient denies nausea, dizziness or blurry vision. No reports of LOC at time of accident. Wound was irrigated. Patient tolerated procedure well.

## 2024-06-20 NOTE — ED Triage Notes (Signed)
 Pt to ED via Pov from home. Pt reports was mowing under a tree when a branch hit the top of his head. Pt has 6-7in laceration on top of head. Bleeding controlled. Dressing applied. No LOC. No blood thinners. Denies pain or dizziness.

## 2024-06-20 NOTE — ED Notes (Signed)
 Telfa placed over wound per Dr. Clarine and covered with Tegaderm. Patient tolerated procedure well.

## 2024-06-20 NOTE — ED Provider Notes (Signed)
 [facility] Provider Note    Event Date/Time   First MD Initiated Contact with Patient 06/20/24 1110     (approximate)   History   Head Injury  Pt to ED via Pov from home. Pt reports was mowing under a tree when a branch hit the top of his head. Pt has 6-7in laceration on top of head. Bleeding controlled. Dressing applied. No LOC. No blood thinners. Denies pain or dizziness.    HPI Gabriel Martin is a 83 y.o. male PMH CHF, anemia, CKD, prior stroke, thrombocytopenia, hyperlipidemia presents for evaluation of head injury - Patient was riding his lawnmower around 10 AM when he skimmed the top of his head on a low hanging branch.  Was wearing a hat but noticed some bleeding, family brought him to emergency department for eval. -No LOC, no vomiting, not on any blood thinners.  Has otherwise been feeling well.  His only complaint is the cut on the top of his head.  Did not fall and branch did not fall on him     Physical Exam   Triage Vital Signs: ED Triage Vitals  Encounter Vitals Group     BP 06/20/24 1048 127/72     Girls Systolic BP Percentile --      Girls Diastolic BP Percentile --      Boys Systolic BP Percentile --      Boys Diastolic BP Percentile --      Pulse Rate 06/20/24 1048 65     Resp 06/20/24 1048 18     Temp 06/20/24 1048 97.9 F (36.6 C)     Temp Source 06/20/24 1048 Oral     SpO2 06/20/24 1048 98 %     Weight --      Height --      Head Circumference --      Peak Flow --      Pain Score 06/20/24 1049 0     Pain Loc --      Pain Education --      Exclude from Growth Chart --     Most recent vital signs: Vitals:   06/20/24 1048  BP: 127/72  Pulse: 65  Resp: 18  Temp: 97.9 F (36.6 C)  SpO2: 98%     General: Awake, no distress.  HEENT:  Normocephalic, 7 cm curving laceration to crown of scalp, no foreign bodies visualized, hemostatic, wound explored through full range of motion in bloodless field.  No galea  exposed.  No other evidence of head trauma. CV:  Good peripheral perfusion.  Resp:  Normal effort. Neuro:  Aox4, CN II-XII intact, FNF wnl, finger taps fast b/l, 5/5 strength in bilateral finger extension/grip, arm flexion/extension, EHL/FHL. BUE AG 10+ sec no drift, BLE AG 5+ sec no drift. Ambulates with steady gait. SILT. Negative Rhomberg.     ED Results / Procedures / Treatments   Labs (all labs ordered are listed, but only abnormal results are displayed) Labs Reviewed - No data to display   EKG  N/a   RADIOLOGY N/a    PROCEDURES:  Critical Care performed: No  .Laceration Repair  Date/Time: 06/20/2024 12:35 PM  Performed by: Clarine Ozell LABOR, MD Authorized by: Clarine Ozell LABOR, MD   Consent:    Consent obtained:  Verbal   Consent given by:  Patient   Risks, benefits, and alternatives were discussed: yes     Risks discussed:  Infection, retained foreign body, pain, vascular damage, nerve damage, need  for additional repair, tendon damage, poor wound healing and poor cosmetic result   Alternatives discussed:  No treatment and delayed treatment Universal protocol:    Procedure explained and questions answered to patient or proxy's satisfaction: yes     Required blood products, implants, devices, and special equipment available: yes     Site/side marked: yes     Immediately prior to procedure, a time out was called: yes     Patient identity confirmed:  Verbally with patient and arm band Anesthesia:    Anesthesia method:  Topical application   Topical anesthetic:  LET Laceration details:    Location:  Scalp   Scalp location:  Crown   Length (cm):  7   Depth (mm):  7 Pre-procedure details:    Preparation:  Patient was prepped and draped in usual sterile fashion Exploration:    Limited defect created (wound extended): no     Hemostasis achieved with:  Direct pressure   Imaging outcome: foreign body not noted     Wound exploration: wound explored through full  range of motion and entire depth of wound visualized     Wound extent: no foreign body, no signs of injury, no underlying fracture and no vascular damage     Contaminated: no   Treatment:    Area cleansed with:  Saline   Amount of cleaning:  Extensive   Irrigation solution:  Sterile saline   Irrigation method:  Syringe   Visualized foreign bodies/material removed: no     Debridement:  None   Scar revision: no   Skin repair:    Repair method:  Staples   Number of staples:  12 Approximation:    Approximation:  Close Repair type:    Repair type:  Simple Post-procedure details:    Dressing:  Non-adherent dressing   Procedure completion:  Tolerated well, no immediate complications    MEDICATIONS ORDERED IN ED: Medications  acetaminophen  (TYLENOL ) tablet 1,000 mg (1,000 mg Oral Given 06/20/24 1148)  lidocaine -EPINEPHrine -tetracaine  (LET) topical gel (3 mLs Topical Given 06/20/24 1153)  Tdap (BOOSTRIX) injection 0.5 mL (0.5 mLs Intramuscular Given 06/20/24 1150)     IMPRESSION / MDM / ASSESSMENT AND PLAN / ED COURSE  I reviewed the triage vital signs and the nursing notes.                              DDX/MDM/AP: Differential diagnosis includes, but is not limited to, scalp laceration though fortunately appears to have been from a skimming injury as opposed to direct blunt force --no clinical concern for intracranial hemorrhage or underlying skull fracture at this time, appears to be isolated soft tissue injury, nonfocal neurologic exam.  Plan: - Wound irrigation - LET, laceration - Update Tdap (last 12/2016 per chart review) - Tylenol     Patient's presentation is most consistent with acute, uncomplicated illness.   ED course below.  Laceration closed with 12 staples.  No foreign bodies visualized.  Tdap updated.  Plan for staple removal in about 1 week.  ED return precautions in place.  Patient and family agree with plan.      FINAL CLINICAL IMPRESSION(S) / ED DIAGNOSES    Final diagnoses:  Laceration of scalp, initial encounter     Rx / DC Orders   ED Discharge Orders     None        Note:  This document was prepared using Dragon voice recognition software and may include  unintentional dictation errors.   Clarine Ozell LABOR, MD 06/20/24 [phone]

## 2024-06-20 NOTE — Discharge Instructions (Signed)
 Your evaluation in the emergency department was overall reassuring.  Your wound was closed with 12 staples, and they should be removed in clinic in about 1 week --please follow-up with your primary care provider for this.  Return to the emergency department with any new or worsening symptoms.

## 2024-06-29 DIAGNOSIS — G4733 Obstructive sleep apnea (adult) (pediatric): Secondary | ICD-10-CM | POA: Diagnosis not present

## 2024-06-29 DIAGNOSIS — Z6834 Body mass index (BMI) 34.0-34.9, adult: Secondary | ICD-10-CM | POA: Diagnosis not present

## 2024-06-29 DIAGNOSIS — D696 Thrombocytopenia, unspecified: Secondary | ICD-10-CM | POA: Diagnosis not present

## 2024-06-29 DIAGNOSIS — K219 Gastro-esophageal reflux disease without esophagitis: Secondary | ICD-10-CM | POA: Diagnosis not present

## 2024-06-29 DIAGNOSIS — Z4802 Encounter for removal of sutures: Secondary | ICD-10-CM | POA: Diagnosis not present

## 2024-06-29 DIAGNOSIS — S0101XD Laceration without foreign body of scalp, subsequent encounter: Secondary | ICD-10-CM | POA: Diagnosis not present

## 2024-06-30 NOTE — Progress Notes (Signed)
 Carelink Summary Report / Loop Recorder

## 2024-07-09 ENCOUNTER — Ambulatory Visit: Payer: Self-pay | Admitting: Cardiology

## 2024-07-09 ENCOUNTER — Ambulatory Visit (INDEPENDENT_AMBULATORY_CARE_PROVIDER_SITE_OTHER)

## 2024-07-09 DIAGNOSIS — I428 Other cardiomyopathies: Secondary | ICD-10-CM

## 2024-07-09 LAB — CUP PACEART REMOTE DEVICE CHECK
Date Time Interrogation Session: 20250814000312
Implantable Pulse Generator Implant Date: 20220420

## 2024-07-15 ENCOUNTER — Ambulatory Visit: Admitting: Cardiology

## 2024-07-25 ENCOUNTER — Other Ambulatory Visit: Payer: Self-pay | Admitting: Cardiology

## 2024-07-30 ENCOUNTER — Ambulatory Visit: Attending: Cardiovascular Disease

## 2024-07-30 DIAGNOSIS — I493 Ventricular premature depolarization: Secondary | ICD-10-CM | POA: Diagnosis not present

## 2024-07-30 LAB — ECHOCARDIOGRAM COMPLETE
AR max vel: 3.31 cm2
AV Area VTI: 3.13 cm2
AV Area mean vel: 2.82 cm2
AV Mean grad: 3 mmHg
AV Peak grad: 5.5 mmHg
Ao pk vel: 1.17 m/s
Area-P 1/2: 2.71 cm2
S' Lateral: 5 cm

## 2024-07-30 MED ORDER — PERFLUTREN LIPID MICROSPHERE
1.0000 mL | INTRAVENOUS | Status: AC | PRN
  Administered 2024-07-30: 5 mL via INTRAVENOUS

## 2024-08-10 ENCOUNTER — Ambulatory Visit (INDEPENDENT_AMBULATORY_CARE_PROVIDER_SITE_OTHER)

## 2024-08-10 DIAGNOSIS — I428 Other cardiomyopathies: Secondary | ICD-10-CM

## 2024-08-11 LAB — CUP PACEART REMOTE DEVICE CHECK
Date Time Interrogation Session: 20250915000703
Implantable Pulse Generator Implant Date: 20220420

## 2024-08-13 ENCOUNTER — Ambulatory Visit: Payer: Self-pay | Admitting: Cardiology

## 2024-08-17 NOTE — Progress Notes (Signed)
 Remote Loop Recorder Transmission

## 2024-08-20 NOTE — Progress Notes (Signed)
 Remote Loop Recorder Transmission

## 2024-09-02 NOTE — Progress Notes (Signed)
 Remote Loop Recorder Transmission

## 2024-09-09 ENCOUNTER — Ambulatory Visit (INDEPENDENT_AMBULATORY_CARE_PROVIDER_SITE_OTHER)

## 2024-09-09 DIAGNOSIS — Z23 Encounter for immunization: Secondary | ICD-10-CM | POA: Diagnosis not present

## 2024-09-09 DIAGNOSIS — I428 Other cardiomyopathies: Secondary | ICD-10-CM | POA: Diagnosis not present

## 2024-09-10 ENCOUNTER — Ambulatory Visit: Payer: Self-pay | Admitting: Cardiology

## 2024-09-10 ENCOUNTER — Encounter

## 2024-09-10 LAB — CUP PACEART REMOTE DEVICE CHECK
Date Time Interrogation Session: 20251015000707
Implantable Pulse Generator Implant Date: 20220420

## 2024-09-14 NOTE — Progress Notes (Signed)
 Remote Loop Recorder Transmission

## 2024-09-25 ENCOUNTER — Other Ambulatory Visit: Payer: Self-pay

## 2024-09-25 MED ORDER — SACUBITRIL-VALSARTAN 97-103 MG PO TABS: 1.0000 | ORAL_TABLET | Freq: Two times a day (BID) | ORAL | 2 refills | Status: DC

## 2024-09-30 ENCOUNTER — Telehealth: Payer: Self-pay | Admitting: Cardiology

## 2024-09-30 ENCOUNTER — Other Ambulatory Visit (HOSPITAL_COMMUNITY): Payer: Self-pay

## 2024-09-30 NOTE — Telephone Encounter (Signed)
 Patient dropped off patient assistance documents for his medication to be refilled, forms were faxed.

## 2024-10-02 ENCOUNTER — Telehealth (HOSPITAL_COMMUNITY): Payer: Self-pay

## 2024-10-02 ENCOUNTER — Other Ambulatory Visit (HOSPITAL_COMMUNITY): Payer: Self-pay

## 2024-10-02 NOTE — Telephone Encounter (Signed)
 Advanced Heart Failure Patient Advocate Encounter  The patient was approved for a [employer] grant that will help cover the cost of Entresto , Jardiance , Metoprolol , Spironolactone .  Total amount awarded, $7,500.  Effective: 09/02/2024 - 09/01/2025.  BIN [id number] PCN [id number] [id number] ID [id number]  Pharmacy provided with approval and processing information. Patient family member informed by phone.  Rachel DEL, CPhT Rx Patient Advocate Phone: [phone]

## 2024-10-04 ENCOUNTER — Other Ambulatory Visit: Payer: Self-pay | Admitting: Cardiology

## 2024-10-10 ENCOUNTER — Encounter

## 2024-10-11 ENCOUNTER — Ambulatory Visit: Attending: Cardiology

## 2024-10-11 DIAGNOSIS — I428 Other cardiomyopathies: Secondary | ICD-10-CM | POA: Diagnosis not present

## 2024-10-12 ENCOUNTER — Encounter

## 2024-10-12 LAB — CUP PACEART REMOTE DEVICE CHECK
Date Time Interrogation Session: 20251116000218
Implantable Pulse Generator Implant Date: 20220420

## 2024-10-13 NOTE — Progress Notes (Signed)
 Remote Loop Recorder Transmission

## 2024-10-14 ENCOUNTER — Ambulatory Visit: Payer: Self-pay | Admitting: Cardiology

## 2024-10-24 ENCOUNTER — Ambulatory Visit
Admission: EM | Admit: 2024-10-24 | Discharge: 2024-10-24 | Disposition: A | Discharge status: Home / Self Care | Attending: Emergency Medicine | Admitting: Emergency Medicine

## 2024-10-24 ENCOUNTER — Ambulatory Visit (INDEPENDENT_AMBULATORY_CARE_PROVIDER_SITE_OTHER)

## 2024-10-24 DIAGNOSIS — M7989 Other specified soft tissue disorders: Secondary | ICD-10-CM | POA: Diagnosis not present

## 2024-10-24 DIAGNOSIS — M79641 Pain in right hand: Secondary | ICD-10-CM | POA: Diagnosis not present

## 2024-10-24 DIAGNOSIS — M19041 Primary osteoarthritis, right hand: Secondary | ICD-10-CM | POA: Diagnosis not present

## 2024-10-24 MED ORDER — METHYLPREDNISOLONE 4 MG PO TBPK: ORAL_TABLET | ORAL | 0 refills | Status: AC

## 2024-10-24 NOTE — ED Triage Notes (Signed)
 Patient states that right hand has been swollen and hurting for several weeks. Patient states that the pain got worse last night. Patient states that he's unsure of any injury. Patient does have an abrasion on the hand but states that the swelling stated before he got the abrasion.

## 2024-10-24 NOTE — Discharge Instructions (Addendum)
 Your x-rays show significant arthritis in your hand and I believe that you are experiencing an arthritis flare.  Take the Medrol Dosepak according to the package instructions to help with pain and inflammation.  If your symptoms do not improve, or new symptoms develop, either follow-up with your primary care provider or with orthopedics such as [facility] here in [place] or in [place].

## 2024-10-24 NOTE — ED Provider Notes (Signed)
 [facility]    CSN: [id number] Arrival date & time: 10/24/24  0806      History   Chief Complaint Chief Complaint  Patient presents with   Hand Problem    HPI Gabriel Martin is a 83 y.o. male.   HPI  83 year old male with past medical history significant for skin cancer, hyperlipidemia, anemia, thrombocytopenia, GERD, arthritis, BPH, CHF, chronic kidney disease, CVA, and gout presents for evaluation of right hand pain and swelling that has been going on for several weeks.  He came in because the pain intensified last night.  He does complain of some stiffness as well as some numbness and tingling in his fingers.  Also a burning sensation in his middle finger.  No known injury.  Does have a history of gout but never in his hand.  Past Medical History:  Diagnosis Date   Acid reflux 05/12/2015   Anemia    Arthritis    finger thumb   Benign neoplasm of large bowel    Benign prostatic hyperplasia with urinary obstruction 05/12/2015   Change in blood platelet count 05/12/2015   CHF (congestive heart failure) (HCC)    Chronic kidney disease 05/12/2015   GERD (gastroesophageal reflux disease)    HLD (hyperlipidemia) 05/12/2015   Hyperlipidemia    Nocturia associated with benign prostatic hypertrophy    Penile ulcer 05/12/2015   Skin cancer    Stroke [facility])    Thrombocytopenia     Patient Active Problem List   Diagnosis Date Noted   Heart failure with reduced ejection fraction (HCC)    Cerebrovascular accident (CVA) (HCC)    Acute embolic stroke (HCC)    Cardiomyopathy (HCC)    Acute on chronic systolic CHF (congestive heart failure) (HCC)    Neuropathy    Cardiomegaly    Syncope 11/02/2020   PVC's (premature ventricular contractions) 11/02/2020   Elevated troponin 11/02/2020   Penile ulcer 05/12/2015   Acid reflux 05/12/2015   HLD (hyperlipidemia) 05/12/2015   Change in blood platelet count 05/12/2015   Thrombocytopenia 05/12/2015    Past Surgical  History:  Procedure Laterality Date   CATARACT EXTRACTION W/PHACO Left 02/29/2016   Procedure: CATARACT EXTRACTION PHACO AND INTRAOCULAR LENS PLACEMENT (IOC) left;  Surgeon: Dene Etienne, MD;  Location: [facility];  Service: Ophthalmology;  Laterality: Left;   CATARACT EXTRACTION W/PHACO Right 04/25/2016   Procedure: CATARACT EXTRACTION PHACO AND INTRAOCULAR LENS PLACEMENT (IOC);  Surgeon: Dene Etienne, MD;  Location: [facility];  Service: Ophthalmology;  Laterality: Right;   COLONOSCOPY WITH PROPOFOL  N/A 07/01/2017   Procedure: COLONOSCOPY WITH PROPOFOL ;  Surgeon: Viktoria Lamar DASEN, MD;  Location: [facility] END[facility]COPY;  Service: Endoscopy;  Laterality: N/A;   EYE SURGERY     HEMORRHOID SURGERY     RIGHT/LEFT HEART CATH AND CORONARY ANGIOGRAPHY Bilateral 01/10/2021   Procedure: RIGHT/LEFT HEART CATH AND CORONARY ANGIOGRAPHY;  Surgeon: Mady Bruckner, MD;  Location: [facility] INVASIVE CV LAB;  Service: Cardiovascular;  Laterality: Bilateral;   TEE WITHOUT CARDIOVERSION N/A 11/30/2020   Procedure: TRANSESOPHAGEAL ECHOCARDIOGRAM (TEE);  Surgeon: Darliss Rogue, MD;  Location: [facility] ORS;  Service: Cardiovascular;  Laterality: N/A;       Home Medications    Prior to Admission medications   Medication Sig Start Date End Date Taking? Authorizing Provider  acetaminophen  (TYLENOL ) 500 MG tablet Take 500 mg by mouth every 6 (six) hours as needed.   Yes [provider]  allopurinol (ZYLOPRIM) 100 MG tablet Take 100 mg by mouth daily.  Yes [provider]  aspirin  EC 81 MG EC tablet Take 1 tablet (81 mg total) by mouth daily. Swallow whole. 11/05/20  Yes Wieting, Richard, MD  atorvastatin  (LIPITOR) 40 MG tablet Take 1 tablet (40 mg total) by mouth daily at 6 PM. 11/04/20  Yes Wieting, Richard, MD  empagliflozin  (JARDIANCE ) 10 MG TABS tablet Take 1 tablet (10 mg total) by mouth daily before breakfast. 11/22/23  Yes Agbor-Etang, Redell, MD  famotidine  (PEPCID ) 20 MG  tablet Take 20 mg by mouth daily.   Yes [provider]  gabapentin  (NEURONTIN ) 100 MG capsule Take 100 mg by mouth daily.   Yes [provider]  methylPREDNISolone (MEDROL DOSEPAK) 4 MG TBPK tablet Take according to the package insert. 10/24/24  Yes Bernardino Ditch, NP  metoprolol  succinate (TOPROL -XL) 25 MG 24 hr tablet TAKE 1 TABLET BY MOUTH TWICE A DAY 10/06/24  Yes Agbor-Etang, Redell, MD  sacubitril -valsartan  (ENTRESTO ) 97-103 MG Take 1 tablet by mouth 2 (two) times daily. 09/25/24  Yes Darliss Redell, MD  spironolactone  (ALDACTONE ) 25 MG tablet TAKE 0.5 TABLETS BY MOUTH AT BEDTIME. 07/28/24  Yes Agbor-Etang, Redell, MD    Family History Family History  Problem Relation Age of Onset   Heart disease Brother    Heart attack Brother 27   Heart attack Mother    Heart attack Nephew 77    Social History Social History   Tobacco Use   Smoking status: Former    Current packs/day: 0.00    Types: Cigarettes    Quit date: 11/26/1978    Years since quitting: 45.9   Smokeless tobacco: Never   Tobacco comments:    quit 40 years ago  Vaping Use   Vaping status: Never Used  Substance Use Topics   Alcohol  use: Yes    Alcohol /week: 6.0 standard drinks of alcohol     Types: 6 Cans of beer per week    Comment: occasional   Drug use: No     Allergies   Patient has no known allergies.   Review of Systems Review of Systems  Constitutional:  Negative for fever.  Musculoskeletal:  Positive for arthralgias and joint swelling.  Skin:  Positive for color change.  Neurological:  Positive for numbness. Negative for weakness.     Physical Exam Triage Vital Signs ED Triage Vitals  Encounter Vitals Group     BP      Girls Systolic BP Percentile      Girls Diastolic BP Percentile      Boys Systolic BP Percentile      Boys Diastolic BP Percentile      Pulse      Resp      Temp      Temp src      SpO2      Weight      Height      Head Circumference      Peak Flow       Pain Score      Pain Loc      Pain Education      Exclude from Growth Chart    No data found.  Updated Vital Signs BP (!) 100/56 (BP Location: Right Arm)   Pulse 66   Temp 97.9 F (36.6 C) (Oral)   Resp 16   Wt 210 lb (95.3 kg)   SpO2 93%   BMI 33.89 kg/m   Visual Acuity Right Eye Distance:   Left Eye Distance:   Bilateral Distance:    Right  Eye Near:   Left Eye Near:    Bilateral Near:     Physical Exam Vitals and nursing note reviewed.  Constitutional:      Appearance: Normal appearance. He is not ill-appearing.  HENT:     Head: Normocephalic and atraumatic.  Musculoskeletal:        General: Swelling and tenderness present. No signs of injury.  Skin:    General: Skin is warm and dry.     Capillary Refill: Capillary refill takes less than 2 seconds.     Findings: Erythema present.  Neurological:     General: No focal deficit present.     Mental Status: He is alert and oriented to person, place, and time.      UC Treatments / Results  Labs (all labs ordered are listed, but only abnormal results are displayed) Labs Reviewed - No data to display  EKG   Radiology DG Hand Complete Right Result Date: 10/24/2024 EXAM: 3 Or More View(s) Xray Of The Right Hand 10/24/2024 08:59:00 Am COMPARISON: None Available. CLINICAL HISTORY: Pain And Swelling For Several Weeks Without Known Injury FINDINGS: BONES AND JOINTS: Severe Osteoarthritis Of First Cmc Joint. Mild Osteoarthritis Of First Ip And Second Mcp Joints. Mild Osteoarthritis Of Scaphotrapeziotrapezoid Joint. Mild Osteoarthritis Of Second Dip Joint. No Evidence Of Fracture Or Dislocation. SOFT TISSUES: The Soft Tissues Are Unremarkable. IMPRESSION: 1. No acute osseous abnormality. 2. Osteoarthritis, as described above . Electronically signed by: Norleen Kil MD 10/24/2024 09:47 AM EST RP Workstation: [id number]    Procedures Procedures (including critical care time)  Medications Ordered in UC Medications -  No data to display  Initial Impression / Assessment and Plan / UC Course  I have reviewed the triage vital signs and the nursing notes.  Pertinent labs & imaging results that were available during my care of the patient were reviewed by me and considered in my medical decision making (see chart for details).   Patient is a pleasant, nontoxic-appearing 83 year old male presenting for evaluation of pain and swelling to the right hand as outlined in the HPI above.  The patient does not remember any injury sustained to the right hand.  He does have a history of gout but reports its better this foot and not ever in his hand.  He has been using topical pain reliever as well as Tylenol  with minor relief of pain.   As you can see in image above, the patient has swelling to the dorsum of the hand with erythema on the lateral aspect.  There is a scabbed abrasion on the medial aspect that the patient reports occurred after the swelling began.  He is left-hand dominant.  Differential diagnose include gout, arthritis, tendinopathy.  I will obtain a radiograph of the right hand to evaluate for any bony abnormality.  Right hand x-rays independently reviewed and evaluated by me.  Impression: Degenerative changes noted throughout the hand.  Patient has significant osteoarthritis at the first carpometacarpal joint.  Soft tissue swelling is present.  No visible fracture or dislocation is noted.  Radiology overread is pending. Radiology impression states no acute osseous abnormality.  Osteoarthritis that is severe in the first [facility] joint and mild osteoarthritis in the first IP and second MCP joints.  Mild osteoarthritis of the scaphoid of the trapezoid joint.  Mild osteoarthritis of second DIP joint.  No evidence of fracture or dislocation.  I will discharge the patient on the diagnosis of osteoarthritis flare.  I will start him on a Medrol  Dosepak  to help with pain and swelling.   Final Clinical Impressions(s) / UC  Diagnoses   Final diagnoses:  Swelling of right hand  Osteoarthritis of right hand, unspecified osteoarthritis type     Discharge Instructions      Your x-rays show significant arthritis in your hand and I believe that you are experiencing an arthritis flare.  Take the Medrol Dosepak according to the package instructions to help with pain and inflammation.  If your symptoms do not improve, or new symptoms develop, either follow-up with your primary care provider or with orthopedics such as [facility] here in [place] or in [place].     ED Prescriptions     Medication Sig Dispense Auth. Provider   methylPREDNISolone (MEDROL DOSEPAK) 4 MG TBPK tablet Take according to the package insert. 1 each Bernardino Ditch, NP      PDMP not reviewed this encounter.   Bernardino Ditch, NP 10/24/24 [phone]

## 2024-10-26 ENCOUNTER — Telehealth: Payer: Self-pay | Admitting: Cardiology

## 2024-10-26 NOTE — Telephone Encounter (Signed)
 Patient came by office to drop off Patient Assistance forms. Forms were faxed.

## 2024-11-02 DIAGNOSIS — D2271 Melanocytic nevi of right lower limb, including hip: Secondary | ICD-10-CM | POA: Diagnosis not present

## 2024-11-02 DIAGNOSIS — D2262 Melanocytic nevi of left upper limb, including shoulder: Secondary | ICD-10-CM | POA: Diagnosis not present

## 2024-11-02 DIAGNOSIS — L570 Actinic keratosis: Secondary | ICD-10-CM | POA: Diagnosis not present

## 2024-11-02 DIAGNOSIS — D2272 Melanocytic nevi of left lower limb, including hip: Secondary | ICD-10-CM | POA: Diagnosis not present

## 2024-11-02 DIAGNOSIS — L821 Other seborrheic keratosis: Secondary | ICD-10-CM | POA: Diagnosis not present

## 2024-11-02 DIAGNOSIS — D485 Neoplasm of uncertain behavior of skin: Secondary | ICD-10-CM | POA: Diagnosis not present

## 2024-11-02 DIAGNOSIS — D2261 Melanocytic nevi of right upper limb, including shoulder: Secondary | ICD-10-CM | POA: Diagnosis not present

## 2024-11-02 DIAGNOSIS — D225 Melanocytic nevi of trunk: Secondary | ICD-10-CM | POA: Diagnosis not present

## 2024-11-04 ENCOUNTER — Telehealth: Payer: Self-pay

## 2024-11-04 NOTE — Telephone Encounter (Signed)
 Forms have not been received by our team. Clinic may have faxed for pt on 12/125. But patient shouldn't need assistance forms due to having a grant that should cover those drugs.   Per chart notes from [facility], patient was notified of grant approval.

## 2024-11-04 NOTE — Telephone Encounter (Signed)
-----   Message from [facility] M sent at 11/04/2024  9:52 AM EST ----- Regarding: Patient Assistance Form PT is wanting an update on the Patient Assistance Form that were faxed 10/26/24

## 2024-11-05 ENCOUNTER — Encounter: Payer: Self-pay | Admitting: Cardiology

## 2024-11-05 ENCOUNTER — Other Ambulatory Visit (HOSPITAL_COMMUNITY): Payer: Self-pay

## 2024-11-05 MED ORDER — SACUBITRIL-VALSARTAN 97-103 MG PO TABS: 1.0000 | ORAL_TABLET | Freq: Two times a day (BID) | ORAL | 2 refills | Status: DC

## 2024-11-05 NOTE — Telephone Encounter (Signed)
 Attempted to call patient. Left message to call back.

## 2024-11-05 NOTE — Telephone Encounter (Signed)
 Called and spoke with patient. Informed patient that there was a grant for his medications. Patient verbalized understanding and appreciation for the call.

## 2024-11-05 NOTE — Addendum Note (Signed)
 Addended by: CRISTOPHER OLIVIA PARAS on: 11/05/2024 10:45 AM   Modules accepted: Orders

## 2024-11-05 NOTE — Telephone Encounter (Signed)
 Patient returned RN's call and is following-up on assistance with getting his Jardiance  and Entresto  medications.

## 2024-11-05 NOTE — Telephone Encounter (Addendum)
 Can you please clarify what the patient is asking for? Per chart notes from Renda Shove, CPhT, patient was informed of grant approval and processing information on 10/02/24.

## 2024-11-05 NOTE — Telephone Encounter (Signed)
 Error

## 2024-11-05 NOTE — Telephone Encounter (Signed)
 Pt requesting c/b with update of grant. Please advise.

## 2024-11-10 ENCOUNTER — Encounter

## 2024-11-11 ENCOUNTER — Ambulatory Visit

## 2024-11-11 DIAGNOSIS — I428 Other cardiomyopathies: Secondary | ICD-10-CM

## 2024-11-12 ENCOUNTER — Other Ambulatory Visit: Payer: Self-pay | Admitting: *Deleted

## 2024-11-12 ENCOUNTER — Encounter

## 2024-11-12 LAB — CUP PACEART REMOTE DEVICE CHECK
Date Time Interrogation Session: 20251217000404
Implantable Pulse Generator Implant Date: 20220420

## 2024-11-12 MED ORDER — EMPAGLIFLOZIN 10 MG PO TABS: 10.0000 mg | ORAL_TABLET | Freq: Every day | ORAL | 3 refills | Status: DC

## 2024-11-12 MED ORDER — EMPAGLIFLOZIN 10 MG PO TABS: 10.0000 mg | ORAL_TABLET | Freq: Every day | ORAL | 0 refills | Status: AC

## 2024-11-12 NOTE — Progress Notes (Signed)
 Remote Loop Recorder Transmission

## 2024-11-13 ENCOUNTER — Ambulatory Visit: Payer: Self-pay | Admitting: Cardiology

## 2024-11-27 ENCOUNTER — Other Ambulatory Visit: Payer: Self-pay

## 2024-11-27 ENCOUNTER — Other Ambulatory Visit: Payer: Self-pay | Admitting: *Deleted

## 2024-11-27 MED ORDER — EMPAGLIFLOZIN 10 MG PO TABS
10.0000 mg | ORAL_TABLET | Freq: Every day | ORAL | 3 refills | Status: AC
  Filled 2024-11-27: qty 90, 90d supply, fill #0
  Filled 2024-11-27 (×2): qty 30, 30d supply, fill #0

## 2024-11-27 MED ORDER — SACUBITRIL-VALSARTAN 97-103 MG PO TABS
1.0000 | ORAL_TABLET | Freq: Two times a day (BID) | ORAL | 2 refills | Status: AC
  Filled 2024-11-27 (×2): qty 180, 90d supply, fill #0

## 2024-11-27 NOTE — Progress Notes (Signed)
 Pt came into office this morning.  Pt is still having trouble with [facility] [place] stating that they have no information about a grant he received for Jardiance , Entresto , Metoprolol  and Spironolactone .  This grant was approved in October and states he has not received any information in the mail.  I printed him information regarding the [employer] grant that was in his chart from Pharmacy encounter on 10/02/2024.  Pt had received samples of Entresto  on 11/12/2024 because [facility] was still denying that they had information regarding a grant for his medication.    Today, I sent prescriptions for both Jardiance  and Entresto  to [facility] and printed the grant info from his chart in hopes that pt can finally get his medications filled without difficulty.  Pt thanked me for my time

## 2024-12-11 ENCOUNTER — Encounter

## 2024-12-12 ENCOUNTER — Ambulatory Visit: Attending: Cardiology

## 2024-12-12 DIAGNOSIS — I428 Other cardiomyopathies: Secondary | ICD-10-CM | POA: Diagnosis not present

## 2024-12-14 ENCOUNTER — Encounter

## 2024-12-15 LAB — CUP PACEART REMOTE DEVICE CHECK
Date Time Interrogation Session: 20260117000130
Implantable Pulse Generator Implant Date: 20220420

## 2024-12-17 ENCOUNTER — Ambulatory Visit: Payer: Self-pay | Admitting: Cardiology

## 2024-12-17 NOTE — Progress Notes (Signed)
 Remote Loop Recorder Transmission

## 2025-01-11 ENCOUNTER — Encounter

## 2025-01-12 ENCOUNTER — Ambulatory Visit

## 2025-01-14 ENCOUNTER — Encounter

## 2025-02-11 ENCOUNTER — Encounter

## 2025-02-12 ENCOUNTER — Ambulatory Visit

## 2025-02-15 ENCOUNTER — Encounter

## 2025-03-14 ENCOUNTER — Encounter

## 2025-03-15 ENCOUNTER — Ambulatory Visit

## 2025-03-18 ENCOUNTER — Encounter
